# Patient Record
Sex: Female | Born: 1945
Health system: Southern US, Community
[De-identification: ages and names within clinical notes are randomized; demographics above are authoritative.]

## PROBLEM LIST (undated history)

## (undated) DIAGNOSIS — Z9221 Personal history of antineoplastic chemotherapy: Secondary | ICD-10-CM

## (undated) DIAGNOSIS — C50919 Malignant neoplasm of unspecified site of unspecified female breast: Secondary | ICD-10-CM

## (undated) DIAGNOSIS — E119 Type 2 diabetes mellitus without complications: Secondary | ICD-10-CM

## (undated) DIAGNOSIS — M1990 Unspecified osteoarthritis, unspecified site: Secondary | ICD-10-CM

## (undated) DIAGNOSIS — E785 Hyperlipidemia, unspecified: Secondary | ICD-10-CM

## (undated) DIAGNOSIS — Z923 Personal history of irradiation: Secondary | ICD-10-CM

## (undated) DIAGNOSIS — H409 Unspecified glaucoma: Secondary | ICD-10-CM

## (undated) DIAGNOSIS — R569 Unspecified convulsions: Secondary | ICD-10-CM

## (undated) DIAGNOSIS — I10 Essential (primary) hypertension: Secondary | ICD-10-CM

## (undated) HISTORY — DX: Unspecified osteoarthritis, unspecified site: M19.90

## (undated) HISTORY — DX: Unspecified convulsions: R56.9

## (undated) HISTORY — DX: Unspecified glaucoma: H40.9

## (undated) HISTORY — PX: ROTATOR CUFF REPAIR: SHX139

## (undated) HISTORY — DX: Essential (primary) hypertension: I10

## (undated) HISTORY — DX: Hyperlipidemia, unspecified: E78.5

---

## 1995-04-14 DIAGNOSIS — C50919 Malignant neoplasm of unspecified site of unspecified female breast: Secondary | ICD-10-CM

## 1995-04-14 DIAGNOSIS — Z923 Personal history of irradiation: Secondary | ICD-10-CM

## 1995-04-14 DIAGNOSIS — Z9221 Personal history of antineoplastic chemotherapy: Secondary | ICD-10-CM

## 1995-04-14 HISTORY — PX: BREAST LUMPECTOMY: SHX2

## 1995-04-14 HISTORY — DX: Personal history of antineoplastic chemotherapy: Z92.21

## 1995-04-14 HISTORY — DX: Personal history of irradiation: Z92.3

## 1995-04-14 HISTORY — DX: Malignant neoplasm of unspecified site of unspecified female breast: C50.919

## 1995-04-14 HISTORY — PX: BREAST EXCISIONAL BIOPSY: SUR124

## 2004-02-25 ENCOUNTER — Ambulatory Visit: Payer: Self-pay | Admitting: Oncology

## 2004-03-12 ENCOUNTER — Ambulatory Visit: Payer: Self-pay | Admitting: General Practice

## 2004-03-13 ENCOUNTER — Ambulatory Visit: Payer: Self-pay | Admitting: Oncology

## 2004-03-20 ENCOUNTER — Ambulatory Visit: Payer: Self-pay | Admitting: Gastroenterology

## 2004-04-23 ENCOUNTER — Ambulatory Visit: Payer: Self-pay | Admitting: Oncology

## 2004-08-25 ENCOUNTER — Ambulatory Visit: Payer: Self-pay | Admitting: Oncology

## 2004-09-11 ENCOUNTER — Ambulatory Visit: Payer: Self-pay | Admitting: Oncology

## 2005-01-22 ENCOUNTER — Ambulatory Visit (HOSPITAL_COMMUNITY): Admission: RE | Admit: 2005-01-22 | Discharge: 2005-01-22 | Payer: Self-pay | Admitting: Plastic Surgery

## 2005-01-22 ENCOUNTER — Ambulatory Visit (HOSPITAL_BASED_OUTPATIENT_CLINIC_OR_DEPARTMENT_OTHER): Admission: RE | Admit: 2005-01-22 | Discharge: 2005-01-23 | Payer: Self-pay | Admitting: Plastic Surgery

## 2005-02-23 ENCOUNTER — Ambulatory Visit: Payer: Self-pay | Admitting: Oncology

## 2005-03-13 ENCOUNTER — Ambulatory Visit: Payer: Self-pay | Admitting: Oncology

## 2005-08-24 ENCOUNTER — Ambulatory Visit: Payer: Self-pay | Admitting: Oncology

## 2005-09-11 ENCOUNTER — Ambulatory Visit: Payer: Self-pay | Admitting: Oncology

## 2006-02-23 ENCOUNTER — Ambulatory Visit: Payer: Self-pay | Admitting: Oncology

## 2006-03-13 ENCOUNTER — Ambulatory Visit: Payer: Self-pay | Admitting: Oncology

## 2006-08-12 ENCOUNTER — Ambulatory Visit: Payer: Self-pay | Admitting: Oncology

## 2006-09-02 ENCOUNTER — Ambulatory Visit: Payer: Self-pay | Admitting: Oncology

## 2006-09-07 ENCOUNTER — Ambulatory Visit: Payer: Self-pay | Admitting: Oncology

## 2006-09-12 ENCOUNTER — Ambulatory Visit: Payer: Self-pay | Admitting: Oncology

## 2007-02-12 ENCOUNTER — Ambulatory Visit: Payer: Self-pay | Admitting: Oncology

## 2007-03-07 ENCOUNTER — Ambulatory Visit: Payer: Self-pay | Admitting: Oncology

## 2007-03-14 ENCOUNTER — Ambulatory Visit: Payer: Self-pay | Admitting: Oncology

## 2007-09-06 ENCOUNTER — Ambulatory Visit: Payer: Self-pay | Admitting: Oncology

## 2007-09-12 ENCOUNTER — Ambulatory Visit: Payer: Self-pay | Admitting: Oncology

## 2007-09-15 ENCOUNTER — Ambulatory Visit: Payer: Self-pay | Admitting: Oncology

## 2007-10-12 ENCOUNTER — Ambulatory Visit: Payer: Self-pay | Admitting: Oncology

## 2008-03-13 ENCOUNTER — Ambulatory Visit: Payer: Self-pay | Admitting: Oncology

## 2008-03-19 ENCOUNTER — Ambulatory Visit: Payer: Self-pay | Admitting: Oncology

## 2008-04-13 ENCOUNTER — Ambulatory Visit: Payer: Self-pay | Admitting: Oncology

## 2008-10-09 ENCOUNTER — Ambulatory Visit: Payer: Self-pay | Admitting: Unknown Physician Specialty

## 2009-12-19 ENCOUNTER — Ambulatory Visit: Payer: Self-pay | Admitting: Unknown Physician Specialty

## 2011-02-09 ENCOUNTER — Ambulatory Visit: Payer: Self-pay | Admitting: Unknown Physician Specialty

## 2012-12-22 ENCOUNTER — Ambulatory Visit: Payer: Self-pay | Admitting: Internal Medicine

## 2013-10-10 DIAGNOSIS — M5416 Radiculopathy, lumbar region: Secondary | ICD-10-CM | POA: Insufficient documentation

## 2013-10-20 ENCOUNTER — Ambulatory Visit: Payer: Self-pay | Admitting: Physical Medicine and Rehabilitation

## 2013-10-23 DIAGNOSIS — M48062 Spinal stenosis, lumbar region with neurogenic claudication: Secondary | ICD-10-CM | POA: Insufficient documentation

## 2013-10-23 DIAGNOSIS — M5136 Other intervertebral disc degeneration, lumbar region: Secondary | ICD-10-CM | POA: Insufficient documentation

## 2013-11-10 ENCOUNTER — Ambulatory Visit: Payer: Self-pay | Admitting: Gastroenterology

## 2013-12-25 ENCOUNTER — Ambulatory Visit: Payer: Self-pay | Admitting: Internal Medicine

## 2014-01-01 DIAGNOSIS — E11649 Type 2 diabetes mellitus with hypoglycemia without coma: Secondary | ICD-10-CM | POA: Insufficient documentation

## 2014-01-23 DIAGNOSIS — E1022 Type 1 diabetes mellitus with diabetic chronic kidney disease: Secondary | ICD-10-CM | POA: Insufficient documentation

## 2014-08-23 DIAGNOSIS — E1069 Type 1 diabetes mellitus with other specified complication: Secondary | ICD-10-CM | POA: Insufficient documentation

## 2014-08-23 DIAGNOSIS — E7800 Pure hypercholesterolemia, unspecified: Secondary | ICD-10-CM | POA: Insufficient documentation

## 2014-08-23 DIAGNOSIS — E785 Hyperlipidemia, unspecified: Secondary | ICD-10-CM

## 2015-02-28 DIAGNOSIS — Z0000 Encounter for general adult medical examination without abnormal findings: Secondary | ICD-10-CM | POA: Insufficient documentation

## 2015-03-05 ENCOUNTER — Other Ambulatory Visit: Payer: Self-pay | Admitting: Internal Medicine

## 2015-03-05 DIAGNOSIS — Z1231 Encounter for screening mammogram for malignant neoplasm of breast: Secondary | ICD-10-CM

## 2015-03-14 ENCOUNTER — Ambulatory Visit
Admission: RE | Admit: 2015-03-14 | Discharge: 2015-03-14 | Disposition: A | Discharge status: Home / Self Care | Payer: Commercial Managed Care - HMO | Source: Ambulatory Visit | Attending: Internal Medicine | Admitting: Internal Medicine

## 2015-03-14 ENCOUNTER — Other Ambulatory Visit: Payer: Self-pay | Admitting: Internal Medicine

## 2015-03-14 DIAGNOSIS — Z1231 Encounter for screening mammogram for malignant neoplasm of breast: Secondary | ICD-10-CM | POA: Diagnosis present

## 2015-03-14 HISTORY — DX: Malignant neoplasm of unspecified site of unspecified female breast: C50.919

## 2015-04-16 DIAGNOSIS — Z4681 Encounter for fitting and adjustment of insulin pump: Secondary | ICD-10-CM | POA: Diagnosis not present

## 2015-04-16 DIAGNOSIS — E785 Hyperlipidemia, unspecified: Secondary | ICD-10-CM | POA: Diagnosis not present

## 2015-04-16 DIAGNOSIS — E10649 Type 1 diabetes mellitus with hypoglycemia without coma: Secondary | ICD-10-CM | POA: Diagnosis not present

## 2015-05-24 DIAGNOSIS — E10649 Type 1 diabetes mellitus with hypoglycemia without coma: Secondary | ICD-10-CM | POA: Diagnosis not present

## 2015-05-24 DIAGNOSIS — Z4681 Encounter for fitting and adjustment of insulin pump: Secondary | ICD-10-CM | POA: Diagnosis not present

## 2015-06-17 DIAGNOSIS — R300 Dysuria: Secondary | ICD-10-CM | POA: Diagnosis not present

## 2015-06-17 DIAGNOSIS — N390 Urinary tract infection, site not specified: Secondary | ICD-10-CM | POA: Diagnosis not present

## 2015-08-21 DIAGNOSIS — Z4681 Encounter for fitting and adjustment of insulin pump: Secondary | ICD-10-CM | POA: Diagnosis not present

## 2015-08-21 DIAGNOSIS — E10649 Type 1 diabetes mellitus with hypoglycemia without coma: Secondary | ICD-10-CM | POA: Diagnosis not present

## 2015-09-25 DIAGNOSIS — N390 Urinary tract infection, site not specified: Secondary | ICD-10-CM | POA: Diagnosis not present

## 2015-09-25 DIAGNOSIS — R300 Dysuria: Secondary | ICD-10-CM | POA: Diagnosis not present

## 2015-10-03 DIAGNOSIS — E10649 Type 1 diabetes mellitus with hypoglycemia without coma: Secondary | ICD-10-CM | POA: Diagnosis not present

## 2015-10-18 DIAGNOSIS — E785 Hyperlipidemia, unspecified: Secondary | ICD-10-CM | POA: Diagnosis not present

## 2015-10-18 DIAGNOSIS — E10649 Type 1 diabetes mellitus with hypoglycemia without coma: Secondary | ICD-10-CM | POA: Diagnosis not present

## 2015-10-18 DIAGNOSIS — Z4681 Encounter for fitting and adjustment of insulin pump: Secondary | ICD-10-CM | POA: Diagnosis not present

## 2015-10-31 DIAGNOSIS — Z4681 Encounter for fitting and adjustment of insulin pump: Secondary | ICD-10-CM | POA: Diagnosis not present

## 2015-10-31 DIAGNOSIS — E10649 Type 1 diabetes mellitus with hypoglycemia without coma: Secondary | ICD-10-CM | POA: Diagnosis not present

## 2015-11-22 DIAGNOSIS — Z4681 Encounter for fitting and adjustment of insulin pump: Secondary | ICD-10-CM | POA: Diagnosis not present

## 2015-11-22 DIAGNOSIS — E10649 Type 1 diabetes mellitus with hypoglycemia without coma: Secondary | ICD-10-CM | POA: Diagnosis not present

## 2016-01-31 DIAGNOSIS — E10649 Type 1 diabetes mellitus with hypoglycemia without coma: Secondary | ICD-10-CM | POA: Diagnosis not present

## 2016-01-31 DIAGNOSIS — Z4681 Encounter for fitting and adjustment of insulin pump: Secondary | ICD-10-CM | POA: Diagnosis not present

## 2016-02-24 ENCOUNTER — Other Ambulatory Visit: Payer: Self-pay | Admitting: Internal Medicine

## 2016-02-24 DIAGNOSIS — Z1231 Encounter for screening mammogram for malignant neoplasm of breast: Secondary | ICD-10-CM

## 2016-03-02 DIAGNOSIS — Z23 Encounter for immunization: Secondary | ICD-10-CM | POA: Diagnosis not present

## 2016-03-02 DIAGNOSIS — N182 Chronic kidney disease, stage 2 (mild): Secondary | ICD-10-CM | POA: Diagnosis not present

## 2016-03-02 DIAGNOSIS — Z0000 Encounter for general adult medical examination without abnormal findings: Secondary | ICD-10-CM | POA: Diagnosis not present

## 2016-03-02 DIAGNOSIS — E1022 Type 1 diabetes mellitus with diabetic chronic kidney disease: Secondary | ICD-10-CM | POA: Diagnosis not present

## 2016-03-02 DIAGNOSIS — E7800 Pure hypercholesterolemia, unspecified: Secondary | ICD-10-CM | POA: Diagnosis not present

## 2016-03-12 DIAGNOSIS — Z4681 Encounter for fitting and adjustment of insulin pump: Secondary | ICD-10-CM | POA: Diagnosis not present

## 2016-03-12 DIAGNOSIS — E10649 Type 1 diabetes mellitus with hypoglycemia without coma: Secondary | ICD-10-CM | POA: Diagnosis not present

## 2016-03-31 ENCOUNTER — Ambulatory Visit
Admission: RE | Admit: 2016-03-31 | Discharge: 2016-03-31 | Disposition: A | Discharge status: Home / Self Care | Payer: PPO | Source: Ambulatory Visit | Attending: Internal Medicine | Admitting: Internal Medicine

## 2016-03-31 DIAGNOSIS — Z1231 Encounter for screening mammogram for malignant neoplasm of breast: Secondary | ICD-10-CM | POA: Insufficient documentation

## 2016-03-31 HISTORY — DX: Personal history of irradiation: Z92.3

## 2016-03-31 HISTORY — DX: Personal history of antineoplastic chemotherapy: Z92.21

## 2016-04-07 DIAGNOSIS — R300 Dysuria: Secondary | ICD-10-CM | POA: Diagnosis not present

## 2016-04-09 DIAGNOSIS — E10649 Type 1 diabetes mellitus with hypoglycemia without coma: Secondary | ICD-10-CM | POA: Diagnosis not present

## 2016-04-09 DIAGNOSIS — E785 Hyperlipidemia, unspecified: Secondary | ICD-10-CM | POA: Diagnosis not present

## 2016-04-09 DIAGNOSIS — E7800 Pure hypercholesterolemia, unspecified: Secondary | ICD-10-CM | POA: Diagnosis not present

## 2016-04-09 DIAGNOSIS — E1022 Type 1 diabetes mellitus with diabetic chronic kidney disease: Secondary | ICD-10-CM | POA: Diagnosis not present

## 2016-04-09 DIAGNOSIS — Z0000 Encounter for general adult medical examination without abnormal findings: Secondary | ICD-10-CM | POA: Diagnosis not present

## 2016-04-09 DIAGNOSIS — N182 Chronic kidney disease, stage 2 (mild): Secondary | ICD-10-CM | POA: Diagnosis not present

## 2016-04-16 DIAGNOSIS — E10649 Type 1 diabetes mellitus with hypoglycemia without coma: Secondary | ICD-10-CM | POA: Diagnosis not present

## 2016-04-16 DIAGNOSIS — E785 Hyperlipidemia, unspecified: Secondary | ICD-10-CM | POA: Diagnosis not present

## 2016-04-16 DIAGNOSIS — Z4681 Encounter for fitting and adjustment of insulin pump: Secondary | ICD-10-CM | POA: Diagnosis not present

## 2016-05-02 ENCOUNTER — Encounter: Payer: Self-pay | Admitting: Emergency Medicine

## 2016-05-02 ENCOUNTER — Emergency Department
Admission: EM | Admit: 2016-05-02 | Discharge: 2016-05-02 | Disposition: A | Discharge status: Home / Self Care | Payer: PPO | Attending: Emergency Medicine | Admitting: Emergency Medicine

## 2016-05-02 DIAGNOSIS — M5136 Other intervertebral disc degeneration, lumbar region: Secondary | ICD-10-CM | POA: Insufficient documentation

## 2016-05-02 DIAGNOSIS — M5137 Other intervertebral disc degeneration, lumbosacral region: Secondary | ICD-10-CM

## 2016-05-02 DIAGNOSIS — M545 Low back pain: Secondary | ICD-10-CM | POA: Diagnosis not present

## 2016-05-02 DIAGNOSIS — Z853 Personal history of malignant neoplasm of breast: Secondary | ICD-10-CM | POA: Diagnosis not present

## 2016-05-02 DIAGNOSIS — M5416 Radiculopathy, lumbar region: Secondary | ICD-10-CM

## 2016-05-02 DIAGNOSIS — E119 Type 2 diabetes mellitus without complications: Secondary | ICD-10-CM | POA: Diagnosis not present

## 2016-05-02 HISTORY — DX: Type 2 diabetes mellitus without complications: E11.9

## 2016-05-02 MED ORDER — CYCLOBENZAPRINE HCL 10 MG PO TABS
5.0000 mg | ORAL_TABLET | Freq: Once | ORAL | Status: AC
  Administered 2016-05-02: 5 mg via ORAL
  Filled 2016-05-02: qty 1

## 2016-05-02 MED ORDER — NAPROXEN 500 MG PO TBEC: 500.0000 mg | DELAYED_RELEASE_TABLET | Freq: Two times a day (BID) | ORAL | 0 refills | Status: DC

## 2016-05-02 MED ORDER — DIAZEPAM 2 MG PO TABS: 2.0000 mg | ORAL_TABLET | Freq: Three times a day (TID) | ORAL | 0 refills | Status: DC | PRN

## 2016-05-02 NOTE — Discharge Instructions (Signed)
Your exam is normal today following your recent flare of back pain. Review of your MRI reveals that your have bulging discs with some nerve root impingement that affects your left and right legs. You should take the prescription meds as directed. Consider following-up with Dr. Arnoldo Morale for continued or worsening symptoms.

## 2016-05-02 NOTE — ED Provider Notes (Signed)
[facility] Emergency Department Provider Note ____________________________________________  Time seen: 2207  I have reviewed the triage vital signs and the nursing notes.  HISTORY  Chief Complaint  Back Pain  HPI Alejandra Lowe is a 71 y.o. female presents to the ED for evaluation of LBP with LLE referral to the calf. She has a remote history of lumbar DDD with radicular symptoms on the right. She denies incontinence, foot drop, or leg weakness. She admits symptoms started on Wednesday, she and her husband have just returned from a trip to [facility]. She reports excessive walking, standing in line, and activity above her normal weekly activities. She denies any slip, trips, or falls. She dosed Aleve this afternoon, and now reports near complete resolution of symptoms.   Past Medical History:  Diagnosis Date  . Breast cancer ([place]) 1997   left breast, radiation, chemo  . Diabetes mellitus without complication ([place])   . Personal history of chemotherapy 1997   BREAST CA  . Personal history of radiation therapy 1997   BREAST CA    There are no active problems to display for this patient.   Past Surgical History:  Procedure Laterality Date  . BREAST EXCISIONAL BIOPSY Left 1997   positive  . ROTATOR CUFF REPAIR      Prior to Admission medications   Medication Sig Start Date End Date Taking? Authorizing Provider  diazepam (VALIUM) 2 MG tablet Take 1 tablet (2 mg total) by mouth every 8 (eight) hours as needed for muscle spasms. 05/02/16   Zaila Crew V Bacon Shaniece Bussa, PA-C  naproxen (EC NAPROSYN) 500 MG EC tablet Take 1 tablet (500 mg total) by mouth 2 (two) times daily with a meal. 05/02/16   Ceaser Ebeling V Bacon Kaylena Pacifico, PA-C    Allergies Patient has no known allergies.  Family History  Problem Relation Age of Onset  . Breast cancer Mother 56  . Breast cancer Sister 65    Social History Social History  Substance Use Topics  . Smoking status: Never Smoker  .  Smokeless tobacco: Never Used  . Alcohol use 4.2 oz/week    7 Glasses of wine per week    Review of Systems  Constitutional: Negative for fever. Cardiovascular: Negative for chest pain. Respiratory: Negative for shortness of breath. Gastrointestinal: Negative for abdominal pain, vomiting and diarrhea. Genitourinary: Negative for dysuria. Musculoskeletal: Positive for back pain with LLE referral. Skin: Negative for rash. Neurological: Negative for headaches, focal weakness or numbness. ____________________________________________  PHYSICAL EXAM:  VITAL SIGNS: ED Triage Vitals  Enc Vitals Group     BP 05/02/16 2109 (!) 108/48     Pulse Rate 05/02/16 2109 91     Resp 05/02/16 2109 18     Temp 05/02/16 2109 98 F (36.7 C)     Temp Source 05/02/16 2109 Oral     SpO2 05/02/16 2109 99 %     Weight 05/02/16 2108 146 lb (66.2 kg)     Height 05/02/16 2108 5\' 7"  (1.702 m)     Head Circumference --      Peak Flow --      Pain Score 05/02/16 2108 7     Pain Loc --      Pain Edu? --      Excl. in [place]? --    Constitutional: Alert and oriented. Well appearing and in no distress. Head: Normocephalic and atraumatic. Cardiovascular: Normal rate, regular rhythm. Normal distal pulses. Respiratory: Normal respiratory effort. No wheezes/rales/rhonchi. Gastrointestinal: Soft and nontender.  No distention. Musculoskeletal: atient with normal spinal alignment without midline tenderness, spasm, deformity, or step-off. She does his shins from sit to stand without assistance. She had a temperature normal toe and heel raise on exam. Her lumbar flexion range is full to the floor. Negative seated straight leg raise bilaterally.Nontender with normal range of motion in all extremities.  Neurologic: nerves II through XII grossly intact. Normal LE ED DTRs bilaterally. Normal toe dorsiflexion and foot eversion on exam. Normal gait without ataxia. Normal speech and language. No gross focal neurologic deficits  are appreciated. Skin:  Skin is warm, dry and intact. No rash noted. Psychiatric: Mood and affect are normal. Patient exhibits appropriate insight and judgment. ____________________________________________   RADIOLOGY  MRI Lumbar Spine (10/2013)  IMPRESSION:  1. Central soft disc protrusion slightly asymmetric to the right at  L4-5 which could affect either or both L5 nerves. Moderate  compression of the thecal sac.  2. Focal disc bulge into the left lateral recess at L5-S1 slightly  compressing the left S1 nerve root sleeve.  ____________________________________________  PROCEDURES  Flexeril 5 mg PO ____________________________________________  INITIAL IMPRESSION / ASSESSMENT AND PLAN / ED COURSE  Patient with LBP with LLE radicular symptoms that are improved at the time of evaluation. She likely experienced a flare of back pain due to her recent cross-country travel activities. She is discharged with a prescription for Valium and EC Naproxen for spasm and inflammation, respectively. She will follow-up with Dr. Newman Pies for ongoing symptom management.  ____________________________________________  FINAL CLINICAL IMPRESSION(S) / ED DIAGNOSES  Final diagnoses:  DDD (degenerative disc disease), lumbosacral  Lumbar radiculopathy      Melvenia Needles, PA-C 05/02/16 2349    Harvest Dark, MD 05/03/16 2230

## 2016-05-02 NOTE — ED Notes (Signed)
Pt is in good condition; discharge instructions reviewed; follow up care and home care reviewed; prescription medication reviewed; pt verbalized understanding; pt is ambulatory but request use of wheelchair to leave ED; pt left with husband

## 2016-05-02 NOTE — ED Triage Notes (Addendum)
Patient with pain to left lower back radiating down her left leg. Patient reports that the pain started on Thursday but has become worse.

## 2016-05-18 DIAGNOSIS — M5416 Radiculopathy, lumbar region: Secondary | ICD-10-CM | POA: Diagnosis not present

## 2016-05-18 DIAGNOSIS — M48062 Spinal stenosis, lumbar region with neurogenic claudication: Secondary | ICD-10-CM | POA: Diagnosis not present

## 2016-05-18 DIAGNOSIS — M5136 Other intervertebral disc degeneration, lumbar region: Secondary | ICD-10-CM | POA: Diagnosis not present

## 2016-06-11 DIAGNOSIS — M5136 Other intervertebral disc degeneration, lumbar region: Secondary | ICD-10-CM | POA: Diagnosis not present

## 2016-06-11 DIAGNOSIS — M48062 Spinal stenosis, lumbar region with neurogenic claudication: Secondary | ICD-10-CM | POA: Diagnosis not present

## 2016-06-11 DIAGNOSIS — M5416 Radiculopathy, lumbar region: Secondary | ICD-10-CM | POA: Diagnosis not present

## 2016-07-02 DIAGNOSIS — E10649 Type 1 diabetes mellitus with hypoglycemia without coma: Secondary | ICD-10-CM | POA: Diagnosis not present

## 2016-07-02 DIAGNOSIS — M5136 Other intervertebral disc degeneration, lumbar region: Secondary | ICD-10-CM | POA: Diagnosis not present

## 2016-07-02 DIAGNOSIS — M5416 Radiculopathy, lumbar region: Secondary | ICD-10-CM | POA: Diagnosis not present

## 2016-07-02 DIAGNOSIS — M48062 Spinal stenosis, lumbar region with neurogenic claudication: Secondary | ICD-10-CM | POA: Diagnosis not present

## 2016-07-16 DIAGNOSIS — E10649 Type 1 diabetes mellitus with hypoglycemia without coma: Secondary | ICD-10-CM | POA: Diagnosis not present

## 2016-07-23 DIAGNOSIS — E10649 Type 1 diabetes mellitus with hypoglycemia without coma: Secondary | ICD-10-CM | POA: Diagnosis not present

## 2016-07-23 DIAGNOSIS — E785 Hyperlipidemia, unspecified: Secondary | ICD-10-CM | POA: Diagnosis not present

## 2016-07-23 DIAGNOSIS — Z4681 Encounter for fitting and adjustment of insulin pump: Secondary | ICD-10-CM | POA: Diagnosis not present

## 2016-08-12 ENCOUNTER — Other Ambulatory Visit: Payer: Self-pay | Admitting: Physical Medicine and Rehabilitation

## 2016-08-12 DIAGNOSIS — M5416 Radiculopathy, lumbar region: Secondary | ICD-10-CM | POA: Diagnosis not present

## 2016-08-12 DIAGNOSIS — M48062 Spinal stenosis, lumbar region with neurogenic claudication: Secondary | ICD-10-CM | POA: Diagnosis not present

## 2016-08-12 DIAGNOSIS — M5136 Other intervertebral disc degeneration, lumbar region: Secondary | ICD-10-CM | POA: Diagnosis not present

## 2016-08-17 ENCOUNTER — Ambulatory Visit
Admission: RE | Admit: 2016-08-17 | Discharge: 2016-08-17 | Disposition: A | Discharge status: Home / Self Care | Payer: PPO | Source: Ambulatory Visit | Attending: Physical Medicine and Rehabilitation | Admitting: Physical Medicine and Rehabilitation

## 2016-08-17 DIAGNOSIS — M5116 Intervertebral disc disorders with radiculopathy, lumbar region: Secondary | ICD-10-CM | POA: Insufficient documentation

## 2016-08-17 DIAGNOSIS — M48061 Spinal stenosis, lumbar region without neurogenic claudication: Secondary | ICD-10-CM | POA: Diagnosis not present

## 2016-08-17 DIAGNOSIS — M5416 Radiculopathy, lumbar region: Secondary | ICD-10-CM | POA: Diagnosis not present

## 2016-08-20 DIAGNOSIS — H40003 Preglaucoma, unspecified, bilateral: Secondary | ICD-10-CM | POA: Diagnosis not present

## 2016-08-20 DIAGNOSIS — H04123 Dry eye syndrome of bilateral lacrimal glands: Secondary | ICD-10-CM | POA: Diagnosis not present

## 2016-08-20 DIAGNOSIS — H26493 Other secondary cataract, bilateral: Secondary | ICD-10-CM | POA: Diagnosis not present

## 2016-08-20 DIAGNOSIS — E119 Type 2 diabetes mellitus without complications: Secondary | ICD-10-CM | POA: Diagnosis not present

## 2016-09-01 DIAGNOSIS — M5416 Radiculopathy, lumbar region: Secondary | ICD-10-CM | POA: Diagnosis not present

## 2016-09-01 DIAGNOSIS — M48062 Spinal stenosis, lumbar region with neurogenic claudication: Secondary | ICD-10-CM | POA: Diagnosis not present

## 2016-09-01 DIAGNOSIS — I10 Essential (primary) hypertension: Secondary | ICD-10-CM | POA: Diagnosis not present

## 2016-09-23 DIAGNOSIS — H26491 Other secondary cataract, right eye: Secondary | ICD-10-CM | POA: Diagnosis not present

## 2016-09-23 DIAGNOSIS — Z961 Presence of intraocular lens: Secondary | ICD-10-CM | POA: Diagnosis not present

## 2016-09-23 DIAGNOSIS — H26493 Other secondary cataract, bilateral: Secondary | ICD-10-CM | POA: Diagnosis not present

## 2016-09-23 DIAGNOSIS — E113299 Type 2 diabetes mellitus with mild nonproliferative diabetic retinopathy without macular edema, unspecified eye: Secondary | ICD-10-CM | POA: Diagnosis not present

## 2016-09-25 ENCOUNTER — Other Ambulatory Visit: Payer: Self-pay | Admitting: Neurosurgery

## 2016-09-25 ENCOUNTER — Encounter: Payer: Self-pay | Admitting: Neurosurgery

## 2016-09-25 DIAGNOSIS — M48062 Spinal stenosis, lumbar region with neurogenic claudication: Secondary | ICD-10-CM

## 2016-10-01 ENCOUNTER — Ambulatory Visit
Admission: RE | Admit: 2016-10-01 | Discharge: 2016-10-01 | Disposition: A | Discharge status: Home / Self Care | Payer: PPO | Source: Ambulatory Visit | Attending: Neurosurgery | Admitting: Neurosurgery

## 2016-10-01 VITALS — BP 121/63 | HR 64

## 2016-10-01 DIAGNOSIS — M4807 Spinal stenosis, lumbosacral region: Secondary | ICD-10-CM | POA: Diagnosis not present

## 2016-10-01 DIAGNOSIS — M48062 Spinal stenosis, lumbar region with neurogenic claudication: Secondary | ICD-10-CM

## 2016-10-01 MED ORDER — IOPAMIDOL (ISOVUE-M 200) INJECTION 41%
15.0000 mL | Freq: Once | INTRAMUSCULAR | Status: AC
  Administered 2016-10-01: 15 mL via INTRATHECAL

## 2016-10-01 MED ORDER — DIAZEPAM 5 MG PO TABS
5.0000 mg | ORAL_TABLET | Freq: Once | ORAL | Status: AC
  Administered 2016-10-01: 5 mg via ORAL

## 2016-10-01 NOTE — Discharge Instructions (Signed)

## 2016-10-02 DIAGNOSIS — E10649 Type 1 diabetes mellitus with hypoglycemia without coma: Secondary | ICD-10-CM | POA: Diagnosis not present

## 2016-10-05 DIAGNOSIS — E10649 Type 1 diabetes mellitus with hypoglycemia without coma: Secondary | ICD-10-CM | POA: Diagnosis not present

## 2016-10-09 DIAGNOSIS — M48061 Spinal stenosis, lumbar region without neurogenic claudication: Secondary | ICD-10-CM | POA: Diagnosis not present

## 2016-10-09 DIAGNOSIS — I10 Essential (primary) hypertension: Secondary | ICD-10-CM | POA: Diagnosis not present

## 2016-10-21 DIAGNOSIS — H26492 Other secondary cataract, left eye: Secondary | ICD-10-CM | POA: Diagnosis not present

## 2016-10-21 DIAGNOSIS — Z961 Presence of intraocular lens: Secondary | ICD-10-CM | POA: Diagnosis not present

## 2016-10-28 DIAGNOSIS — H26493 Other secondary cataract, bilateral: Secondary | ICD-10-CM | POA: Diagnosis not present

## 2016-11-01 DIAGNOSIS — E10649 Type 1 diabetes mellitus with hypoglycemia without coma: Secondary | ICD-10-CM | POA: Diagnosis not present

## 2016-11-11 DIAGNOSIS — M48061 Spinal stenosis, lumbar region without neurogenic claudication: Secondary | ICD-10-CM | POA: Diagnosis not present

## 2016-11-11 DIAGNOSIS — M48062 Spinal stenosis, lumbar region with neurogenic claudication: Secondary | ICD-10-CM | POA: Diagnosis not present

## 2016-12-02 DIAGNOSIS — E10649 Type 1 diabetes mellitus with hypoglycemia without coma: Secondary | ICD-10-CM | POA: Diagnosis not present

## 2016-12-12 DIAGNOSIS — N390 Urinary tract infection, site not specified: Secondary | ICD-10-CM | POA: Diagnosis not present

## 2016-12-12 DIAGNOSIS — A499 Bacterial infection, unspecified: Secondary | ICD-10-CM | POA: Diagnosis not present

## 2016-12-12 DIAGNOSIS — R300 Dysuria: Secondary | ICD-10-CM | POA: Diagnosis not present

## 2017-01-02 DIAGNOSIS — E10649 Type 1 diabetes mellitus with hypoglycemia without coma: Secondary | ICD-10-CM | POA: Diagnosis not present

## 2017-01-20 DIAGNOSIS — H40003 Preglaucoma, unspecified, bilateral: Secondary | ICD-10-CM | POA: Diagnosis not present

## 2017-01-20 DIAGNOSIS — E113213 Type 2 diabetes mellitus with mild nonproliferative diabetic retinopathy with macular edema, bilateral: Secondary | ICD-10-CM | POA: Diagnosis not present

## 2017-01-28 DIAGNOSIS — E785 Hyperlipidemia, unspecified: Secondary | ICD-10-CM | POA: Diagnosis not present

## 2017-01-28 DIAGNOSIS — E10649 Type 1 diabetes mellitus with hypoglycemia without coma: Secondary | ICD-10-CM | POA: Diagnosis not present

## 2017-01-28 DIAGNOSIS — Z4681 Encounter for fitting and adjustment of insulin pump: Secondary | ICD-10-CM | POA: Diagnosis not present

## 2017-02-01 DIAGNOSIS — E10649 Type 1 diabetes mellitus with hypoglycemia without coma: Secondary | ICD-10-CM | POA: Diagnosis not present

## 2017-03-03 DIAGNOSIS — Z23 Encounter for immunization: Secondary | ICD-10-CM | POA: Diagnosis not present

## 2017-03-03 DIAGNOSIS — E1022 Type 1 diabetes mellitus with diabetic chronic kidney disease: Secondary | ICD-10-CM | POA: Diagnosis not present

## 2017-03-03 DIAGNOSIS — Z0000 Encounter for general adult medical examination without abnormal findings: Secondary | ICD-10-CM | POA: Diagnosis not present

## 2017-03-03 DIAGNOSIS — N182 Chronic kidney disease, stage 2 (mild): Secondary | ICD-10-CM | POA: Diagnosis not present

## 2017-03-03 DIAGNOSIS — E7800 Pure hypercholesterolemia, unspecified: Secondary | ICD-10-CM | POA: Diagnosis not present

## 2017-03-04 DIAGNOSIS — E10649 Type 1 diabetes mellitus with hypoglycemia without coma: Secondary | ICD-10-CM | POA: Diagnosis not present

## 2017-03-19 DIAGNOSIS — N390 Urinary tract infection, site not specified: Secondary | ICD-10-CM | POA: Diagnosis not present

## 2017-03-19 DIAGNOSIS — R300 Dysuria: Secondary | ICD-10-CM | POA: Diagnosis not present

## 2017-04-02 DIAGNOSIS — E10649 Type 1 diabetes mellitus with hypoglycemia without coma: Secondary | ICD-10-CM | POA: Diagnosis not present

## 2017-04-03 DIAGNOSIS — E10649 Type 1 diabetes mellitus with hypoglycemia without coma: Secondary | ICD-10-CM | POA: Diagnosis not present

## 2017-04-10 DIAGNOSIS — B373 Candidiasis of vulva and vagina: Secondary | ICD-10-CM | POA: Diagnosis not present

## 2017-04-10 DIAGNOSIS — N390 Urinary tract infection, site not specified: Secondary | ICD-10-CM | POA: Diagnosis not present

## 2017-04-10 DIAGNOSIS — R300 Dysuria: Secondary | ICD-10-CM | POA: Diagnosis not present

## 2017-04-10 DIAGNOSIS — A499 Bacterial infection, unspecified: Secondary | ICD-10-CM | POA: Diagnosis not present

## 2017-04-27 DIAGNOSIS — R030 Elevated blood-pressure reading, without diagnosis of hypertension: Secondary | ICD-10-CM | POA: Diagnosis not present

## 2017-04-27 DIAGNOSIS — M48062 Spinal stenosis, lumbar region with neurogenic claudication: Secondary | ICD-10-CM | POA: Diagnosis not present

## 2017-05-04 DIAGNOSIS — E10649 Type 1 diabetes mellitus with hypoglycemia without coma: Secondary | ICD-10-CM | POA: Diagnosis not present

## 2017-06-04 DIAGNOSIS — E10649 Type 1 diabetes mellitus with hypoglycemia without coma: Secondary | ICD-10-CM | POA: Diagnosis not present

## 2017-07-02 DIAGNOSIS — E10649 Type 1 diabetes mellitus with hypoglycemia without coma: Secondary | ICD-10-CM | POA: Diagnosis not present

## 2017-07-20 DIAGNOSIS — E10649 Type 1 diabetes mellitus with hypoglycemia without coma: Secondary | ICD-10-CM | POA: Diagnosis not present

## 2017-07-20 DIAGNOSIS — E785 Hyperlipidemia, unspecified: Secondary | ICD-10-CM | POA: Diagnosis not present

## 2017-07-23 DIAGNOSIS — E10649 Type 1 diabetes mellitus with hypoglycemia without coma: Secondary | ICD-10-CM | POA: Diagnosis not present

## 2017-07-27 DIAGNOSIS — Z4681 Encounter for fitting and adjustment of insulin pump: Secondary | ICD-10-CM | POA: Diagnosis not present

## 2017-07-27 DIAGNOSIS — E785 Hyperlipidemia, unspecified: Secondary | ICD-10-CM | POA: Diagnosis not present

## 2017-07-27 DIAGNOSIS — E10649 Type 1 diabetes mellitus with hypoglycemia without coma: Secondary | ICD-10-CM | POA: Diagnosis not present

## 2017-08-02 DIAGNOSIS — E10649 Type 1 diabetes mellitus with hypoglycemia without coma: Secondary | ICD-10-CM | POA: Diagnosis not present

## 2017-08-26 DIAGNOSIS — E113213 Type 2 diabetes mellitus with mild nonproliferative diabetic retinopathy with macular edema, bilateral: Secondary | ICD-10-CM | POA: Diagnosis not present

## 2017-09-01 DIAGNOSIS — E10649 Type 1 diabetes mellitus with hypoglycemia without coma: Secondary | ICD-10-CM | POA: Diagnosis not present

## 2017-09-21 DIAGNOSIS — N390 Urinary tract infection, site not specified: Secondary | ICD-10-CM | POA: Diagnosis not present

## 2017-09-21 DIAGNOSIS — R399 Unspecified symptoms and signs involving the genitourinary system: Secondary | ICD-10-CM | POA: Diagnosis not present

## 2017-10-02 DIAGNOSIS — E10649 Type 1 diabetes mellitus with hypoglycemia without coma: Secondary | ICD-10-CM | POA: Diagnosis not present

## 2017-10-27 DIAGNOSIS — E10649 Type 1 diabetes mellitus with hypoglycemia without coma: Secondary | ICD-10-CM | POA: Diagnosis not present

## 2017-11-24 DIAGNOSIS — N390 Urinary tract infection, site not specified: Secondary | ICD-10-CM | POA: Diagnosis not present

## 2017-11-24 DIAGNOSIS — R399 Unspecified symptoms and signs involving the genitourinary system: Secondary | ICD-10-CM | POA: Diagnosis not present

## 2017-12-16 DIAGNOSIS — R300 Dysuria: Secondary | ICD-10-CM | POA: Diagnosis not present

## 2017-12-16 DIAGNOSIS — N390 Urinary tract infection, site not specified: Secondary | ICD-10-CM | POA: Diagnosis not present

## 2017-12-21 ENCOUNTER — Ambulatory Visit: Payer: PPO | Admitting: Urology

## 2017-12-21 ENCOUNTER — Encounter: Payer: Self-pay | Admitting: Urology

## 2017-12-21 VITALS — BP 117/70 | HR 78 | Ht 67.0 in | Wt 140.6 lb

## 2017-12-21 DIAGNOSIS — N952 Postmenopausal atrophic vaginitis: Secondary | ICD-10-CM | POA: Diagnosis not present

## 2017-12-21 DIAGNOSIS — N390 Urinary tract infection, site not specified: Secondary | ICD-10-CM | POA: Diagnosis not present

## 2017-12-21 DIAGNOSIS — N3941 Urge incontinence: Secondary | ICD-10-CM | POA: Diagnosis not present

## 2017-12-21 MED ORDER — ESTRADIOL 0.1 MG/GM VA CREA: TOPICAL_CREAM | VAGINAL | 12 refills | Status: DC

## 2017-12-21 MED ORDER — PHENAZOPYRIDINE HCL 200 MG PO TABS: ORAL_TABLET | ORAL | 0 refills | Status: DC

## 2017-12-21 MED ORDER — ESTROGENS, CONJUGATED 0.625 MG/GM VA CREA: TOPICAL_CREAM | VAGINAL | 12 refills | Status: DC

## 2017-12-21 MED ORDER — MIRABEGRON ER 25 MG PO TB24: 25.0000 mg | ORAL_TABLET | Freq: Every day | ORAL | 0 refills | Status: DC

## 2017-12-21 NOTE — Progress Notes (Signed)
12/21/2017 3:10 PM   Alejandra Lowe [birth year] [id number]  Referring provider: Kirk Ruths, MD [place] [facility] [place], [place] [address]  Chief Complaint  Patient presents with  . Urinary Tract Infection    HPI: Patient is a 71 -year-old Caucasian female who is referred to [place] by Dr. Luna Glasgow for recurrent urinary tract infections.  She states that she has had several urinary tract infections over the last 4 years and 3 urinary tract infections recently.  Reviewing her records,  she has had five documented urine positive urine cultures this year.   + e.coli 12/18/2017 + e.coli 11/26/2017 + e.coli 09/21/2017 + e.coli 04/10/2017 + e.coli 03/19/2018     Her symptoms with a urinary tract infection consist of frequency, suprapubic pressure and the palms of her hand hurt.     She has baseline nocturia x2-3, urge and stress incontinence that have been present for the last 2 years.  She states that she wears 1 pad daily.    She denies/endorses dysuria, gross hematuria, suprapubic pain, back pain, abdominal pain or flank pain associated with UTI's.    She has not had any recent fevers, chills, nausea or vomiting associated with UTI's.   She does not have a history of nephrolithiasis, GU surgery or GU trauma.   She is not sexually active.   She is postmenopausal.   She denies constipation and/or diarrhea.   She does engage in good perineal hygiene. She does not take tub baths.  She has/does not have incontinence.  She is using incontinence pads, one day.    She is drinking 4 bottles of water daily.   She drinks two cups of coffee.  She drinks three glasses of alcohol on the weekend.    She has tried cranberry tablets, but she still continued to have UTI's.       PMH: Past Medical History:  Diagnosis Date  . Arthritis   . Breast cancer ([place]) 1997   left breast, radiation, chemo  . Diabetes mellitus without complication  ([place])   . Glaucoma   . Hyperlipidemia   . Hypertension   . Personal history of chemotherapy 1997   BREAST CA  . Personal history of radiation therapy 1997   BREAST CA  . Seizure [facility])     Surgical History: Past Surgical History:  Procedure Laterality Date  . BREAST EXCISIONAL BIOPSY Left 1997   positive  . ROTATOR CUFF REPAIR      Home Medications:  Allergies as of 12/21/2017   No Known Allergies     Medication List        Accurate as of 12/21/17  3:10 PM. Always use your most recent med list.          aspirin EC 81 MG tablet Take by mouth.   atorvastatin 10 MG tablet Commonly known as:  LIPITOR TAKE 1/2 TABLETS (5 MG TOTAL) BY MOUTH TWICE A WEEK   calcium carbonate 500 MG chewable tablet Commonly known as:  TUMS - dosed in mg elemental calcium Chew by mouth.   calcium carbonate 600 MG Tabs tablet Commonly known as:  OS-CAL Take by mouth.   ciprofloxacin 500 MG tablet Commonly known as:  CIPRO Take 500 mg by mouth 2 (two) times daily.   conjugated estrogens vaginal cream Commonly known as:  PREMARIN Apply 0.26m (pea-sized amount)  just inside the vaginal introitus with a finger-tip on  Monday, Wednesday and Friday nights.  diazepam 2 MG tablet Commonly known as:  VALIUM Take 1 tablet (2 mg total) by mouth every 8 (eight) hours as needed for muscle spasms.   estradiol 0.1 MG/GM vaginal cream Commonly known as:  ESTRACE Apply 0.60m (pea-sized amount)  just inside the vaginal introitus with a finger-tip on Monday, Wednesday and Friday nights.   FREESTYLE LIBRE 14 DAY READER Devi Use 1 kit as directed E10.649   FREESTYLE LIBRE 14 DAY SENSOR Misc USE 1 EACH EVERY 14 (FOURTEEN) DAYS E10.649   GLUCAGON EMERGENCY 1 MG injection Generic drug:  glucagon Take as directed   mirabegron ER 25 MG Tb24 tablet Commonly known as:  MYRBETRIQ Take 1 tablet (25 mg total) by mouth daily.   naproxen 500 MG EC tablet Commonly known as:  EC NAPROSYN Take 1 tablet  (500 mg total) by mouth 2 (two) times daily with a meal.   NOVOLOG 100 UNIT/ML injection Generic drug:  insulin aspart USE UP TO 40 UNITS PER DAY IN THE ACCUCHEK PUMP AS DIRECTED.   omega-3 acid ethyl esters 1 g capsule Commonly known as:  LOVAZA Take by mouth.   phenazopyridine 200 MG tablet Commonly known as:  PYRIDIUM TAKE 1 TABLET (200 MG TOTAL) BY MOUTH 3 (THREE) TIMES DAILY WITH MEALS   RELION KETONE strip Generic drug:  acetone (urine) test   simethicone 125 MG chewable tablet Commonly known as:  MYLICON   triamcinolone 0.1 % paste Commonly known as:  KENALOG Apply small amount to ulcer as needed.   vitamin B-12 1000 MCG tablet Commonly known as:  CYANOCOBALAMIN Take by mouth.   vitamin E 400 UNIT capsule Take by mouth.       Allergies: No Known Allergies  Family History: Family History  Problem Relation Age of Onset  . Breast cancer Mother 750 . Breast cancer Sister 630   Social History:  reports that she has never smoked. She has never used smokeless tobacco. She reports that she drinks about 7.0 standard drinks of alcohol per week. She reports that she does not use drugs.  ROS: UROLOGY Frequent Urination?: Yes Hard to postpone urination?: Yes Burning/pain with urination?: Yes Get up at night to urinate?: Yes Leakage of urine?: Yes Urine stream starts and stops?: No Trouble starting stream?: No Do you have to strain to urinate?: No Blood in urine?: Yes Urinary tract infection?: Yes Sexually transmitted disease?: No Injury to kidneys or bladder?: No Painful intercourse?: No Weak stream?: No Currently pregnant?: No Vaginal bleeding?: No Last menstrual period?: Postmenopausal  Gastrointestinal Nausea?: No Vomiting?: No Indigestion/heartburn?: No Diarrhea?: No Constipation?: No  Constitutional Fever: No Night sweats?: No Weight loss?: No Fatigue?: No  Skin Skin rash/lesions?: Yes Itching?: Yes  Eyes Blurred vision?: Yes Double  vision?: No  Ears/Nose/Throat Sore throat?: No Sinus problems?: No  Hematologic/Lymphatic Swollen glands?: No Easy bruising?: Yes  Cardiovascular Leg swelling?: No Chest pain?: No  Respiratory Cough?: No Shortness of breath?: No  Endocrine Excessive thirst?: No  Musculoskeletal Back pain?: Yes Joint pain?: Yes  Neurological Headaches?: No Dizziness?: No  Psychologic Depression?: No Anxiety?: No  Physical Exam: BP 117/70 (BP Location: Left Arm, Patient Position: Sitting, Cuff Size: Normal)   Pulse 78   Ht _0  (1.702 m)   Wt 140 lb 9.6 oz (63.8 kg)   BMI 22.02 kg/m   Constitutional:  Well nourished. Alert and oriented, No acute distress. HEENT: [place] AT, moist mucus membranes.  Trachea midline, no masses. Cardiovascular: No clubbing, cyanosis, or edema.  Respiratory: Normal respiratory effort, no increased work of breathing. GI: Abdomen is soft, non tender, non distended, no abdominal masses. Liver and spleen not palpable.  No hernias appreciated.  Stool sample for occult testing is not indicated.   GU: No CVA tenderness.  No bladder fullness or masses.  Atrophic external genitalia, normal pubic hair distribution, no lesions.  Normal urethral meatus, no lesions, no prolapse, no discharge.   No urethral masses, tenderness and/or tenderness. No bladder fullness, tenderness or masses. Pale vagina mucosa, poor estrogen effect, no discharge, no lesions, good pelvic support, grade I cystocele and no rectocele noted.  No cervical motion tenderness.  Uterus is freely mobile and non-fixed.  No adnexal/parametria masses or tenderness noted.  Anus and perineum are without rashes or lesions.    Skin: No rashes, bruises or suspicious lesions. Lymph: No cervical or inguinal adenopathy. Neurologic: Grossly intact, no focal deficits, moving all 4 extremities. Psychiatric: Normal mood and affect.  Laboratory Data: No results found for: WBC, HGB, HCT, MCV, PLT  No results found for:  CREATININE  No results found for: PSA  No results found for: TESTOSTERONE  No results found for: HGBA1C  No results found for: TSH  No results found for: CHOL, HDL, CHOLHDL, VLDL, LDLCALC  No results found for: AST No results found for: ALT No components found for: ALKALINEPHOPHATASE No components found for: BILIRUBINTOTAL  No results found for: ESTRADIOL  Urinalysis No results found for: COLORURINE, APPEARANCEUR, LABSPEC, PHURINE, GLUCOSEU, HGBUR, BILIRUBINUR, KETONESUR, PROTEINUR, UROBILINOGEN, NITRITE, LEUKOCYTESUR  I have reviewed the labs.  Assessment & Plan:   1. rUTI's Criteria for recurrent UTI has been met with 2 or more infections in 6 months or 3 or greater infections in one year  Patient has good fluid intake Patient did not find taking cranberry tablets effective in preventing recurrent UTIs Patient is instructed to take probiotics  Avoid soaking in tubs and wipe front to back after urinating   2. Vaginal atrophy I explained to the patient that when women go through menopause and her estrogen levels are severely diminished, the normal vaginal flora will change.  This is due to an increase of the vaginal canal's pH. Because of this, the vaginal canal may be colonized by bacteria from the rectum instead of the protective lactobacillus.  This, accompanied by the loss of the mucus barrier with vaginal atrophy, is a cause of recurrent urinary tract infections. In some studies, the use of vaginal estrogen cream has been demonstrated to reduce  recurrent urinary tract infections to one a year.  Patient was given a sample of vaginal estrogen cream (Premarin vaginal cream) and instructed to apply 0.19m (pea-sized amount)  just inside the vaginal introitus with a finger-tip on Monday, Wednesday and Friday nights.  I explained to the patient that vaginally administered estrogen, which causes only a slight increase in the blood estrogen levels, have fewer contraindications and  adverse systemic effects that oral HT. I have also given prescriptions for the Estrace cream and Premarin cream, so that the patient may carry them to the pharmacy to see which one of the branded creams would be most economical for her.  If she finds both medications cost prohibitive, she is instructed to call the office.  We can then call in a compounded vaginal estrogen cream for the patient that may be more affordable.   She will follow up in three months for an exam.    3. Urge incontinence Discussed behavioral therapies, bladder training and bladder  control strategies Offered medical therapy with beta-3 adrenergic receptor agonist - would like to try the beta-3 adrenergic receptor agonist (Myrbetriq).  Given Myrbetriq 25 mg samples, #28.  I have reviewed with the patient of the side effects of Myrbetriq, such as: elevation in BP, urinary retention and/or HA.  She will return in one month for PVR and symptom recheck.   RTC in 3 weeks for PVR and symptom recheck                                        Return in about 3 weeks (around 01/11/2018) for PVR and OAB questionnaire.  These notes generated with voice recognition software. I apologize for typographical errors.  Zara Council, PA-C  [facility] [address]  [place], [place] [phone]

## 2017-12-28 DIAGNOSIS — E10649 Type 1 diabetes mellitus with hypoglycemia without coma: Secondary | ICD-10-CM | POA: Diagnosis not present

## 2018-01-13 ENCOUNTER — Ambulatory Visit (INDEPENDENT_AMBULATORY_CARE_PROVIDER_SITE_OTHER): Payer: PPO | Admitting: Urology

## 2018-01-13 ENCOUNTER — Encounter: Payer: Self-pay | Admitting: Urology

## 2018-01-13 VITALS — BP 105/69 | HR 78 | Ht 67.0 in | Wt 140.9 lb

## 2018-01-13 DIAGNOSIS — N3941 Urge incontinence: Secondary | ICD-10-CM | POA: Diagnosis not present

## 2018-01-13 LAB — BLADDER SCAN AMB NON-IMAGING: Scan Result: 0

## 2018-01-13 MED ORDER — MIRABEGRON ER 25 MG PO TB24: 25.0000 mg | ORAL_TABLET | Freq: Every day | ORAL | 3 refills | Status: DC

## 2018-01-13 NOTE — Progress Notes (Signed)
01/13/2018 3:28 PM   Alejandra Lowe [birth year] [id number]  Referring provider: Kirk Ruths, MD [facility] [place], [place] [address]  Chief Complaint  Patient presents with  . Follow-up    HPI: Patient is a 72 -year-old Caucasian female with recurrent UTIs, vaginal atrophy and urge incontinence who presents today for a 3-week follow-up after trial of Myrbetriq 25 mg daily.    Background history  who is referred to [place] by Dr. Luna Glasgow for recurrent urinary tract infections.  She states that she has had several urinary tract infections over the last 4 years and 3 urinary tract infections recently. Reviewing her records,  she has had five documented urine positive urine cultures this year.   + e.coli 12/18/2017 + e.coli 11/26/2017 + e.coli 09/21/2017 + e.coli 04/10/2017 + e.coli 03/19/2018   Her symptoms with a urinary tract infection consist of frequency, suprapubic pressure and the palms of her hand hurt.   She has baseline nocturia x2-3, urge and stress incontinence that have been present for the last 2 years.  She states that she wears 1 pad daily.   She denies/endorses dysuria, gross hematuria, suprapubic pain, back pain, abdominal pain or flank pain associated with UTI's.   She has not had any recent fevers, chills, nausea or vomiting associated with UTI's.   She does not have a history of nephrolithiasis, GU surgery or GU trauma.   She is not sexually active.   She is postmenopausal.   She denies constipation and/or diarrhea.   She does engage in good perineal hygiene. She does not take tub baths.  She has incontinence.  She is using incontinence pads, one day.   She is drinking 4 bottles of water daily.   She drinks two cups of coffee.  She drinks three glasses of alcohol on the weekend.  She has tried cranberry tablets, but she still continued to have UTI's.    Today, The patient has been experiencing urgency x 0-3, frequency x  4-7, not restricting fluids to avoid visits to the restroom, not engaging in toilet mapping, incontinence x 0-3 and nocturia x 0-3 (improved).   Her BP is 105/69.  Her PVR is 0 mL.  She feels the Myrbetriq has given her great improvement and would like a prescription for the medication.  She was also using the vaginal estrogen cream 3 nights weekly.  She did not find the prescription cost prohibitive.     PMH: Past Medical History:  Diagnosis Date  . Arthritis   . Breast cancer ([facility] [facility]) 1997   left breast, radiation, chemo  . Diabetes mellitus without complication ([place])   . Glaucoma   . Hyperlipidemia   . Hypertension   . Personal history of chemotherapy 1997   BREAST CA  . Personal history of radiation therapy 1997   BREAST CA  . Seizure [facility])     Surgical History: Past Surgical History:  Procedure Laterality Date  . BREAST EXCISIONAL BIOPSY Left 1997   positive  . ROTATOR CUFF REPAIR      Home Medications:  Allergies as of 01/13/2018   No Known Allergies     Medication List        Accurate as of 01/13/18  3:28 PM. Always use your most recent med list.          aspirin EC 81 MG tablet Take by mouth.   atorvastatin 10 MG tablet Commonly known as:  LIPITOR TAKE  1/2 TABLETS (5 MG TOTAL) BY MOUTH TWICE A WEEK   calcium carbonate 600 MG Tabs tablet Commonly known as:  OS-CAL Take by mouth.   conjugated estrogens vaginal cream Commonly known as:  PREMARIN Apply 0.67m (pea-sized amount)  just inside the vaginal introitus with a finger-tip on  Monday, Wednesday and Friday nights.   estradiol 0.1 MG/GM vaginal cream Commonly known as:  ESTRACE Apply 0.560m(pea-sized amount)  just inside the vaginal introitus with a finger-tip on Monday, Wednesday and Friday nights.   FREESTYLE LIBRE 14 DAY READER Devi Use 1 kit as directed E10.649   FREESTYLE LIBRE 14 DAY SENSOR Misc USE 1 EACH EVERY 14 (FOURTEEN) DAYS E10.649   GLUCAGON EMERGENCY 1 MG injection Generic drug:   glucagon Take as directed   mirabegron ER 25 MG Tb24 tablet Commonly known as:  MYRBETRIQ Take 1 tablet (25 mg total) by mouth daily.   NOVOLOG 100 UNIT/ML injection Generic drug:  insulin aspart USE UP TO 40 UNITS PER DAY IN THE ACCUCHEK PUMP AS DIRECTED.   omega-3 acid ethyl esters 1 g capsule Commonly known as:  LOVAZA Take by mouth.   phenazopyridine 200 MG tablet Commonly known as:  PYRIDIUM TAKE 1 TABLET (200 MG TOTAL) BY MOUTH 3 (THREE) TIMES DAILY WITH MEALS   RELION KETONE strip Generic drug:  acetone (urine) test   vitamin B-12 1000 MCG tablet Commonly known as:  CYANOCOBALAMIN Take by mouth.   vitamin E 400 UNIT capsule Take by mouth.       Allergies: No Known Allergies  Family History: Family History  Problem Relation Age of Onset  . Breast cancer Mother 7060. Breast cancer Sister 6422  Social History:  reports that she has never smoked. She has never used smokeless tobacco. She reports that she drinks about 7.0 standard drinks of alcohol per week. She reports that she does not use drugs.  ROS: UROLOGY Frequent Urination?: Yes Hard to postpone urination?: No Burning/pain with urination?: No Get up at night to urinate?: Yes Leakage of urine?: Yes Urine stream starts and stops?: No Trouble starting stream?: No Do you have to strain to urinate?: No Blood in urine?: No Urinary tract infection?: No Sexually transmitted disease?: No Injury to kidneys or bladder?: No Painful intercourse?: No Weak stream?: No Currently pregnant?: No Vaginal bleeding?: No Last menstrual period?: n  Gastrointestinal Nausea?: No Vomiting?: No Indigestion/heartburn?: No Diarrhea?: No Constipation?: No  Constitutional Fever: No Night sweats?: Yes Weight loss?: No Fatigue?: No  Skin Skin rash/lesions?: No Itching?: No  Eyes Blurred vision?: No Double vision?: No  Ears/Nose/Throat Sinus problems?: No  Hematologic/Lymphatic Swollen glands?: No Easy  bruising?: No  Cardiovascular Leg swelling?: No Chest pain?: No  Respiratory Cough?: No Shortness of breath?: No  Endocrine Excessive thirst?: No  Musculoskeletal Back pain?: No  Neurological Headaches?: No Dizziness?: No  Psychologic Depression?: No Anxiety?: No  Physical Exam: BP 105/69 (BP Location: Left Arm, Patient Position: Sitting, Cuff Size: Normal)   Pulse 78   Ht _0  (1.702 m)   Wt 140 lb 14.4 oz (63.9 kg)   BMI 22.07 kg/m   Constitutional: Well nourished. Alert and oriented, No acute distress. HEENT: [place] AT, moist mucus membranes. Trachea midline, no masses. Cardiovascular: No clubbing, cyanosis, or edema. Respiratory: Normal respiratory effort, no increased work of breathing. Skin: No rashes, bruises or suspicious lesions. Lymph: No cervical or inguinal adenopathy. Neurologic: Grossly intact, no focal deficits, moving all 4 extremities. Psychiatric: Normal mood  and affect.  Laboratory Data: No results found for: WBC, HGB, HCT, MCV, PLT  No results found for: CREATININE  No results found for: PSA  No results found for: TESTOSTERONE  No results found for: HGBA1C  No results found for: TSH  No results found for: CHOL, HDL, CHOLHDL, VLDL, LDLCALC  No results found for: AST No results found for: ALT No components found for: ALKALINEPHOPHATASE No components found for: BILIRUBINTOTAL  No results found for: ESTRADIOL  Urinalysis No results found for: COLORURINE, APPEARANCEUR, LABSPEC, PHURINE, GLUCOSEU, HGBUR, BILIRUBINUR, KETONESUR, PROTEINUR, UROBILINOGEN, NITRITE, LEUKOCYTESUR  I have reviewed the labs.  Assessment & Plan:   1. rUTI's No infection since her last visit with [place] Patient asked to contact her office with symptoms of UTI  2. Vaginal atrophy Continue the vaginal estrogen cream 3 nights weekly She will follow up in 6 months for an exam.    3. Urge incontinence Continue Myrbetriq 25 mg daily, prescription is  given Patient to follow-up in 6 months for OAB questionnaire, PVR and exam                                      Return in about 6 months (around 07/15/2018) for OAB questionnaire, PVR and exam.  These notes generated with voice recognition software. I apologize for typographical errors.  Zara Council, PA-C  [facility] [address]  [place], [place] [address] [phone]

## 2018-01-26 DIAGNOSIS — E10649 Type 1 diabetes mellitus with hypoglycemia without coma: Secondary | ICD-10-CM | POA: Diagnosis not present

## 2018-01-26 DIAGNOSIS — N182 Chronic kidney disease, stage 2 (mild): Secondary | ICD-10-CM | POA: Diagnosis not present

## 2018-01-26 DIAGNOSIS — E785 Hyperlipidemia, unspecified: Secondary | ICD-10-CM | POA: Diagnosis not present

## 2018-01-26 DIAGNOSIS — E1069 Type 1 diabetes mellitus with other specified complication: Secondary | ICD-10-CM | POA: Diagnosis not present

## 2018-01-26 DIAGNOSIS — E1022 Type 1 diabetes mellitus with diabetic chronic kidney disease: Secondary | ICD-10-CM | POA: Diagnosis not present

## 2018-01-28 ENCOUNTER — Other Ambulatory Visit: Payer: Self-pay | Admitting: Internal Medicine

## 2018-01-28 DIAGNOSIS — Z1231 Encounter for screening mammogram for malignant neoplasm of breast: Secondary | ICD-10-CM

## 2018-01-31 DIAGNOSIS — E10649 Type 1 diabetes mellitus with hypoglycemia without coma: Secondary | ICD-10-CM | POA: Diagnosis not present

## 2018-02-23 ENCOUNTER — Ambulatory Visit
Admission: RE | Admit: 2018-02-23 | Discharge: 2018-02-23 | Disposition: A | Discharge status: Home / Self Care | Payer: PPO | Source: Ambulatory Visit | Attending: Internal Medicine | Admitting: Internal Medicine

## 2018-02-23 DIAGNOSIS — Z1231 Encounter for screening mammogram for malignant neoplasm of breast: Secondary | ICD-10-CM | POA: Diagnosis not present

## 2018-03-01 DIAGNOSIS — H40003 Preglaucoma, unspecified, bilateral: Secondary | ICD-10-CM | POA: Diagnosis not present

## 2018-03-01 DIAGNOSIS — E103211 Type 1 diabetes mellitus with mild nonproliferative diabetic retinopathy with macular edema, right eye: Secondary | ICD-10-CM | POA: Diagnosis not present

## 2018-03-01 DIAGNOSIS — E103292 Type 1 diabetes mellitus with mild nonproliferative diabetic retinopathy without macular edema, left eye: Secondary | ICD-10-CM | POA: Diagnosis not present

## 2018-03-28 DIAGNOSIS — E113293 Type 2 diabetes mellitus with mild nonproliferative diabetic retinopathy without macular edema, bilateral: Secondary | ICD-10-CM | POA: Diagnosis not present

## 2018-03-29 DIAGNOSIS — E10649 Type 1 diabetes mellitus with hypoglycemia without coma: Secondary | ICD-10-CM | POA: Diagnosis not present

## 2018-03-31 DIAGNOSIS — Z0000 Encounter for general adult medical examination without abnormal findings: Secondary | ICD-10-CM | POA: Diagnosis not present

## 2018-03-31 DIAGNOSIS — Z23 Encounter for immunization: Secondary | ICD-10-CM | POA: Diagnosis not present

## 2018-03-31 DIAGNOSIS — M5416 Radiculopathy, lumbar region: Secondary | ICD-10-CM | POA: Diagnosis not present

## 2018-03-31 DIAGNOSIS — E785 Hyperlipidemia, unspecified: Secondary | ICD-10-CM | POA: Diagnosis not present

## 2018-03-31 DIAGNOSIS — E1069 Type 1 diabetes mellitus with other specified complication: Secondary | ICD-10-CM | POA: Diagnosis not present

## 2018-04-13 DIAGNOSIS — E10649 Type 1 diabetes mellitus with hypoglycemia without coma: Secondary | ICD-10-CM | POA: Diagnosis not present

## 2018-05-03 DIAGNOSIS — E10649 Type 1 diabetes mellitus with hypoglycemia without coma: Secondary | ICD-10-CM | POA: Diagnosis not present

## 2018-07-14 ENCOUNTER — Other Ambulatory Visit: Payer: Self-pay

## 2018-07-14 ENCOUNTER — Telehealth (INDEPENDENT_AMBULATORY_CARE_PROVIDER_SITE_OTHER): Payer: PPO | Admitting: Urology

## 2018-07-14 ENCOUNTER — Telehealth: Payer: Self-pay | Admitting: Urology

## 2018-07-14 DIAGNOSIS — Z87440 Personal history of urinary (tract) infections: Secondary | ICD-10-CM

## 2018-07-14 DIAGNOSIS — N3941 Urge incontinence: Secondary | ICD-10-CM

## 2018-07-14 DIAGNOSIS — N952 Postmenopausal atrophic vaginitis: Secondary | ICD-10-CM

## 2018-07-14 NOTE — Progress Notes (Signed)
Virtual Visit via Telephone Note  I connected with Alejandra Lowe on 07/14/2018 at [phone] by telephone and verified that I am speaking with the correct person using two identifiers.  They are located at home.   I am located at my home.    This visit type was conducted due to national recommendations for restrictions regarding the COVID-19 Pandemic (e.g. social distancing).  This format is felt to be most appropriate for this patient at this time.  All issues noted in this document were discussed and addressed.  No physical exam was performed.   I discussed the limitations, risks, security and privacy concerns of performing an evaluation and management service by telephone and the availability of in person appointments. I also discussed with the patient that there may be a patient responsible charge related to this service. The patient expressed understanding and agreed to proceed.   History of Present Illness: Alejandra Lowe is a 73 year old female with a history of rUTI's, vaginal atrophy and urge incontinence who was last seen in our office on January 13, 2018.  At that time she was not experiencing symptoms of urinary tract infection, she was instructed to continue the vaginal estrogen cream 3 nights weekly and to continue the Myrbetriq 25 mg daily.  Since October, she is not had any urinary tract infections.  She states about 2 months ago she had a day of suprapubic pressure for which she took Pyridium and the symptoms abated.  She states she is no longer using the vaginal estrogen cream as it made her feel uncomfortable in her vaginal area.  She denied any vaginal itching or discharge.  She is having good success with Myrbetriq 25 mg daily for her urge incontinence and she is wondering if she can take the medication less often.   Observations/Objective:   Assessment and Plan:  1. History of rUTI's Asymptomatic at this time Patient to contact office if she should experience symptoms of a UTI  2.  Vaginal atrophy Patient could not tolerate the vaginal estrogen cream so she has since discontinued the cream  3. Urge incontinence At goal with Myrbetriq 25 mg, she will reduce the medication to taking it every other day She will follow-up in 6 months for OAB questionnaire and PVR  Follow Up Instructions:  Alejandra Lowe will follow-up in 6 months for OAB questionnaire and PVR.  She will contact us if she should experience any symptoms of a UTI prior to that appointment.  I discussed the assessment and treatment plan with the patient. The patient was provided an opportunity to ask questions and all were answered. The patient agreed with the plan and demonstrated an understanding of the instructions.   The patient was advised to call back or seek an in-person evaluation if the symptoms worsen or if the condition fails to improve as anticipated.  I provided 10 minutes of non-face-to-face time during this encounter.   Emauri Krygier, PA-C

## 2018-07-14 NOTE — Telephone Encounter (Signed)
Would you call Alejandra Lowe and have her follow up in 6 months for an OAB questionnaire and PVR?

## 2018-07-15 NOTE — Telephone Encounter (Signed)
Follow up appointment made in 6 months for an OAB questionnaire and PVR.  Letter mailed to the patient's home address.

## 2018-08-03 DIAGNOSIS — E1069 Type 1 diabetes mellitus with other specified complication: Secondary | ICD-10-CM | POA: Diagnosis not present

## 2018-08-03 DIAGNOSIS — E10649 Type 1 diabetes mellitus with hypoglycemia without coma: Secondary | ICD-10-CM | POA: Diagnosis not present

## 2018-08-03 DIAGNOSIS — E785 Hyperlipidemia, unspecified: Secondary | ICD-10-CM | POA: Diagnosis not present

## 2018-08-08 ENCOUNTER — Telehealth: Payer: Self-pay | Admitting: Urology

## 2018-08-08 NOTE — Telephone Encounter (Signed)
Pt is having painful urination. Pt thinks she is having another UTI and would like something called in.

## 2018-08-09 ENCOUNTER — Other Ambulatory Visit: Payer: Self-pay | Admitting: Urology

## 2018-08-09 MED ORDER — SULFAMETHOXAZOLE-TRIMETHOPRIM 800-160 MG PO TABS: 1.0000 | ORAL_TABLET | Freq: Two times a day (BID) | ORAL | 0 refills | Status: DC

## 2018-08-09 NOTE — Telephone Encounter (Signed)
Made patient aware of script sent to pharmacy. 

## 2018-08-09 NOTE — Telephone Encounter (Signed)
I sent a [facility] [address] for her.

## 2018-08-19 DIAGNOSIS — R509 Fever, unspecified: Secondary | ICD-10-CM | POA: Diagnosis not present

## 2018-08-29 DIAGNOSIS — H40003 Preglaucoma, unspecified, bilateral: Secondary | ICD-10-CM | POA: Diagnosis not present

## 2018-09-14 ENCOUNTER — Other Ambulatory Visit: Payer: Self-pay

## 2018-09-14 ENCOUNTER — Other Ambulatory Visit: Payer: Self-pay | Admitting: Urology

## 2018-09-14 ENCOUNTER — Ambulatory Visit: Payer: PPO

## 2018-09-14 ENCOUNTER — Telehealth: Payer: Self-pay | Admitting: Urology

## 2018-09-14 DIAGNOSIS — N390 Urinary tract infection, site not specified: Secondary | ICD-10-CM

## 2018-09-14 LAB — MICROSCOPIC EXAMINATION
Epithelial Cells (non renal): NONE SEEN /hpf (ref 0–10)
WBC, UA: >30 /hpf — AB (ref 0–5)

## 2018-09-14 LAB — URINALYSIS, COMPLETE
Bilirubin, UA: NEGATIVE
Glucose, UA: NEGATIVE
Nitrite, UA: POSITIVE — AB
Specific Gravity, UA: 1.01 (ref 1.005–1.030)
Urobilinogen, Ur: 1 mg/dL (ref 0.2–1.0)
pH, UA: 5 (ref 5.0–7.5)

## 2018-09-14 NOTE — Telephone Encounter (Signed)
Patient called the office today with a possible UTI.  She is experiencing pain & pressure.  She has chronic UTIs.  She is requesting Cipro 500mg  - she feels like that works well for her.  [facility]  I adding patient to the nurse schedule for today for UA and culture.

## 2018-09-14 NOTE — Progress Notes (Signed)
Patient presents in office for urinalysis and culture due to urinary frequency, leakage of urine, as well as finding it difficult to postpone urine. The patient has a h/o recurrent UTI. She began having symptoms yesterday. Denies flank pain or fever.   Per Alejandra Lowe, we will wait on cx to treat. Patient informed

## 2018-09-14 NOTE — Progress Notes (Signed)
I would like to wait on the culture results as she had similar symptoms in April.  We prescribed her Septra based on symptoms due to the COVID-19 pandemic, so no UA or culture was completed.  She has an active MyChart account, so I can contact her over the weekend when her culture results become available.    Patient notified and voiced understanding.

## 2018-09-15 DIAGNOSIS — E103211 Type 1 diabetes mellitus with mild nonproliferative diabetic retinopathy with macular edema, right eye: Secondary | ICD-10-CM | POA: Insufficient documentation

## 2018-09-15 DIAGNOSIS — E103292 Type 1 diabetes mellitus with mild nonproliferative diabetic retinopathy without macular edema, left eye: Secondary | ICD-10-CM | POA: Insufficient documentation

## 2018-09-19 ENCOUNTER — Other Ambulatory Visit: Payer: Self-pay

## 2018-09-19 ENCOUNTER — Telehealth: Payer: Self-pay

## 2018-09-19 ENCOUNTER — Other Ambulatory Visit: Payer: Self-pay | Admitting: Urology

## 2018-09-19 DIAGNOSIS — E10649 Type 1 diabetes mellitus with hypoglycemia without coma: Secondary | ICD-10-CM | POA: Diagnosis not present

## 2018-09-19 LAB — CULTURE, URINE COMPREHENSIVE

## 2018-09-19 MED ORDER — PHENAZOPYRIDINE HCL 200 MG PO TABS: ORAL_TABLET | ORAL | 0 refills | Status: DC

## 2018-09-19 MED ORDER — AMOXICILLIN-POT CLAVULANATE 875-125 MG PO TABS: 1.0000 | ORAL_TABLET | Freq: Two times a day (BID) | ORAL | 0 refills | Status: DC

## 2018-09-19 NOTE — Telephone Encounter (Signed)
-----   Message from Nori Riis, PA-C sent at 09/19/2018  7:35 AM EDT ----- Would you check with Mrs. Dragos and make sure she has picked up her antibiotic?

## 2018-09-19 NOTE — Telephone Encounter (Signed)
Patient notified

## 2018-09-29 DIAGNOSIS — I10 Essential (primary) hypertension: Secondary | ICD-10-CM | POA: Insufficient documentation

## 2018-09-29 DIAGNOSIS — E1069 Type 1 diabetes mellitus with other specified complication: Secondary | ICD-10-CM | POA: Diagnosis not present

## 2018-09-29 DIAGNOSIS — E785 Hyperlipidemia, unspecified: Secondary | ICD-10-CM | POA: Diagnosis not present

## 2018-09-29 DIAGNOSIS — Z0000 Encounter for general adult medical examination without abnormal findings: Secondary | ICD-10-CM | POA: Diagnosis not present

## 2018-09-29 DIAGNOSIS — R399 Unspecified symptoms and signs involving the genitourinary system: Secondary | ICD-10-CM | POA: Diagnosis not present

## 2018-11-15 DIAGNOSIS — E119 Type 2 diabetes mellitus without complications: Secondary | ICD-10-CM | POA: Diagnosis not present

## 2018-12-23 ENCOUNTER — Ambulatory Visit: Payer: PPO | Admitting: Physician Assistant

## 2018-12-23 ENCOUNTER — Other Ambulatory Visit: Payer: Self-pay

## 2018-12-23 VITALS — BP 145/68 | HR 74 | Ht 67.0 in | Wt 144.2 lb

## 2018-12-23 DIAGNOSIS — N3001 Acute cystitis with hematuria: Secondary | ICD-10-CM | POA: Diagnosis not present

## 2018-12-23 DIAGNOSIS — N390 Urinary tract infection, site not specified: Secondary | ICD-10-CM

## 2018-12-23 LAB — MICROSCOPIC EXAMINATION: WBC, UA: >30 /hpf — AB (ref 0–5)

## 2018-12-23 LAB — URINALYSIS, COMPLETE: Specific Gravity, UA: 1.03 (ref 1.005–1.030)

## 2018-12-23 MED ORDER — NITROFURANTOIN MONOHYD MACRO 100 MG PO CAPS: 100.0000 mg | ORAL_CAPSULE | Freq: Every day | ORAL | 0 refills | Status: AC

## 2018-12-23 MED ORDER — NITROFURANTOIN MONOHYD MACRO 100 MG PO CAPS: 100.0000 mg | ORAL_CAPSULE | Freq: Two times a day (BID) | ORAL | 0 refills | Status: AC

## 2018-12-23 NOTE — Progress Notes (Signed)
12/23/2018 8:46 AM   Alejandra Lowe 05/27/1945 [id number]  CC: Bladder pain  HPI: Alejandra Lowe is a 73 y.o. female who presents today for evaluation of possible UTI. She is an established [facility] patient who last saw Alejandra Lowe on 07/14/2018 for follow-up of recurrent UTI, vaginal atrophy, and urge incontinence.  She reports a 2-day history of bladder pain. She denies fevers, chills, nausea, vomiting, flank pain, and gross hematuria. She has taken Pyridium at home with adequate symptom palliation.  She does have a history of recurrent UTI, most recently in June 2020 with multi drug-resistant E coli and treated with Augmentin. Of note, this is her third reported UTI in 5 months. She is not on daily antibiotic prophylaxis. She discontinued vaginal estrogen cream, reporting it caused an unusual vaginal sensation. She also takes Myrbetriq 32m every other day for OAB.  Patient reports daily bowel movements, however she states that her stools are often hard and pellet-like.  In-office UA unable to read or assay due to color interference; urine microscopy with >30 WBCs/HPF, 3-10 RBCs/HPF, and moderate bacteria.  PMH: Past Medical History:  Diagnosis Date  . Arthritis   . [place] [place]   left breast, radiation, chemo  . [place]   . Glaucoma   . Hyperlipidemia   . Hypertension   . Personal history of chemotherapy 1997   BREAST CA  . Personal history of radiation therapy 1997   BREAST CA  . Seizure ([facility]     Surgical History: Past Surgical History:  Procedure Laterality Date  . BREAST EXCISIONAL BIOPSY Left 1997   positive  . BREAST LUMPECTOMY Left 1997   W/ radiation  . ROTATOR CUFF REPAIR      Home Medications:  Allergies as of 12/23/2018   No Known Allergies     Medication List       Accurate as of December 23, 2018 11:59 PM. If you have any questions, ask your nurse or doctor.        amoxicillin-clavulanate  875-125 MG tablet Commonly known as: AUGMENTIN Take 1 tablet by mouth every 12 (twelve) hours.   aspirin EC 81 MG tablet Take by mouth.   atorvastatin 10 MG tablet Commonly known as: LIPITOR TAKE 1/2 TABLETS (5 MG TOTAL) BY MOUTH TWICE A WEEK   calcium carbonate 600 MG Tabs tablet Commonly known as: OS-CAL Take by mouth.   FreeStyle Libre 14 Day Reader DKerrin MoUse 1 kit as directed E[employer]14 Day Sensor Misc USE 1 EACH EVERY 14 (FOURTEEN) DAYS E10.649   Glucagon Emergency 1 MG injection Generic drug: glucagon Take as directed   mirabegron ER 25 MG Tb24 tablet Commonly known as: MYRBETRIQ Take 1 tablet (25 mg total) by mouth daily.   nitrofurantoin (macrocrystal-monohydrate) 100 MG capsule Commonly known as: MACROBID Take 1 capsule (100 mg total) by mouth every 12 (twelve) hours for 5 days. Started by: SDebroah Loop PA-C   nitrofurantoin (macrocrystal-monohydrate) 100 MG capsule Commonly known as: MACROBID Take 1 capsule (100 mg total) by mouth daily. Started by: SDebroah Loop PA-C   NovoLOG 100 UNIT/ML injection Generic drug: insulin aspart USE UP TO 40 UNITS PER DAY IN THE ACCUCHEK PUMP AS DIRECTED.   omega-3 acid ethyl esters 1 g capsule Commonly known as: LOVAZA Take by mouth.   phenazopyridine 200 MG tablet Commonly known as: PYRIDIUM TAKE 1 TABLET (200 MG TOTAL) BY MOUTH 3 (THREE) TIMES DAILY WITH MEALS  ReliOn Ketone strip Generic drug: acetone (urine) test   vitamin B-12 1000 MCG tablet Commonly known as: CYANOCOBALAMIN Take by mouth.   vitamin E 400 UNIT capsule Take by mouth.       Allergies:  No Known Allergies  Family History: Family History  Problem Relation Age of Onset  . Breast cancer Mother 7  . Breast cancer Sister 10    Social History:   reports that she has never smoked. She has never used smokeless tobacco. She reports current alcohol use of about 7.0 standard drinks of alcohol per week.  She reports that she does not use drugs.  ROS: UROLOGY Frequent Urination?: Yes Hard to postpone urination?: Yes Burning/pain with urination?: Yes Get up at night to urinate?: Yes Leakage of urine?: Yes Urine stream starts and stops?: No Trouble starting stream?: No Do you have to strain to urinate?: No Blood in urine?: No Urinary tract infection?: Yes Sexually transmitted disease?: No Injury to kidneys or bladder?: No Painful intercourse?: No Weak stream?: No Currently pregnant?: No Vaginal bleeding?: No Last menstrual period?: n  Gastrointestinal Nausea?: No Vomiting?: No Indigestion/heartburn?: No Diarrhea?: No Constipation?: No  Constitutional Fever: No Night sweats?: No Weight loss?: No Fatigue?: No  Skin Skin rash/lesions?: No Itching?: No  Eyes Blurred vision?: No Double vision?: No  Ears/Nose/Throat Sore throat?: No Sinus problems?: No  Hematologic/Lymphatic Swollen glands?: No Easy bruising?: No  Cardiovascular Leg swelling?: No Chest pain?: No  Respiratory Cough?: No Shortness of breath?: No  Endocrine Excessive thirst?: No  Musculoskeletal Back pain?: No Joint pain?: No  Neurological Headaches?: Yes Dizziness?: No  Psychologic Depression?: No Anxiety?: No  Physical Exam: BP (!) 145/68 (BP Location: Left Arm, Patient Position: Sitting, Cuff Size: Normal)   Pulse 74   Ht _0  (1.702 m)   Wt 144 lb 3.2 oz (65.4 kg)   BMI 22.58 kg/m   Constitutional:  Alert and oriented, no acute distress, nontoxic appearing HEENT: , AT Cardiovascular: No clubbing, cyanosis, or edema Respiratory: Normal respiratory effort, no increased work of breathing Skin: No rashes, bruises or suspicious lesions Neurologic: Grossly intact, no focal deficits, moving all 4 extremities Psychiatric: Normal mood and affect  Laboratory Data: Results for orders placed or performed in visit on 12/23/18  CULTURE, URINE COMPREHENSIVE   Specimen: Urine    UR  Result Value Ref Range   Urine Culture, Comprehensive Final report (A)    Organism ID, Bacteria Escherichia coli (A)    ANTIMICROBIAL SUSCEPTIBILITY Comment   Microscopic Examination   URINE  Result Value Ref Range   WBC, UA >30 (A) 0 - 5 /hpf   RBC 3-10 (A) 0 - 2 /hpf   Epithelial Cells (non renal) 0-10 0 - 10 /hpf   Bacteria, UA Moderate (A) None seen/Few  Urinalysis, Complete  Result Value Ref Range   Specific Gravity, UA 1.030 1.005 - 1.030   pH, UA CANCELED    Color, UA Orange Yellow   Appearance Ur Cloudy (A) Clear   Protein,UA CANCELED    Glucose, UA CANCELED    Ketones, UA CANCELED    Microscopic Examination See below:    Assessment & Plan:   1. Recurrent UTI Patient with history of recurrent UTI.  Of note, today's infection would mark her third urinary tract infection in 5 months.  She expresses frustration with the frequency of these infections.  Additionally, recent urine cultures demonstrate increased drug resistance.  I believe it is prudent for her to start antibiotic prophylaxis  after treatment of recurrent infection.  Nitrofurantoin 100 mg daily sent to her pharmacy; I advised her to start this after completion of treatment for her current infection..    Additionally, I advised her to start a daily cranberry supplement and add a dietary fiber supplement to assist with her constipation.  She expressed an understanding of this plan. - Start daily cranberry supplement - Start bowel regimen of Benefiber to ease stools - nitrofurantoin, macrocrystal-monohydrate, (MACROBID) 100 MG capsule; Take 1 capsule (100 mg total) by mouth daily.  Dispense: 90 capsule; Refill: 0  2. Acute cystitis with hematuria Patient with symptoms and UA consistent with acute cystitis with hematuria today.  Urine culture sent.  She will need to return to the clinic in 1 week for lab visit for repeat UA to prove resolution of hematuria.  Nitrofurantoin 100 mg BID x5 days sent to her pharmacy. -  Urinalysis, Complete - Urinalysis, Complete; Future - CULTURE, URINE COMPREHENSIVE - nitrofurantoin, macrocrystal-monohydrate, (MACROBID) 100 MG capsule; Take 1 capsule (100 mg total) by mouth every 12 (twelve) hours for 5 days.  Dispense: 10 capsule; Refill: 0  Debroah Loop, PA-C  [facility] [facility] [place] [address] [place], [place] [address] [phone]

## 2018-12-23 NOTE — Patient Instructions (Addendum)
1. Stay well hydrated. 2. Your goal is to have comfortable, formed, easy-to-pass stools. You may use either of the over-the-counter supplements Benefiber or Miralax to help with this. I recommend that you try Benefiber first and move on to Miralax if this is not helping you enough. Adjust your dose of either of these to create the desired effect. 3. Start taking an over-the-counter cranberry supplement for urinary tract health. Take this once to twice daily on an empty stomach. 4. Return to clinic in one week to drop off another urine sample.

## 2018-12-25 LAB — CULTURE, URINE COMPREHENSIVE

## 2018-12-28 ENCOUNTER — Telehealth: Payer: Self-pay | Admitting: Physician Assistant

## 2018-12-28 NOTE — Telephone Encounter (Signed)
Pt is confused about Macrobid RX that she received.  There were 2 RX sent in for 100 mg.  Can you please call pt and explain to her.  I told pt you were in clinic, so not sure when it would be.

## 2018-12-28 NOTE — Telephone Encounter (Signed)
Spoke with the patient via telephone.  I clarified that the Macrobid twice daily was for treatment of her current infection, and she should transition to once daily for antibiotic suppression.  She expressed understanding.  She stated there was a mixup with the pharmacy and she did not receive her suppressive antibiotics.  She asked me to follow-up with them directly.  I called her pharmacy and clarified that I sent 2 prescriptions for 2 different purposes.  They will fill her suppressive antibiotics and call her when this is ready today.

## 2018-12-30 ENCOUNTER — Other Ambulatory Visit: Payer: Self-pay

## 2018-12-30 ENCOUNTER — Other Ambulatory Visit: Payer: PPO

## 2018-12-30 DIAGNOSIS — N3001 Acute cystitis with hematuria: Secondary | ICD-10-CM | POA: Diagnosis not present

## 2018-12-30 LAB — MICROSCOPIC EXAMINATION
Bacteria, UA: NONE SEEN
RBC: NONE SEEN /hpf (ref 0–2)

## 2018-12-30 LAB — URINALYSIS, COMPLETE
Bilirubin, UA: NEGATIVE
Glucose, UA: NEGATIVE
Nitrite, UA: NEGATIVE
RBC, UA: NEGATIVE
Specific Gravity, UA: 1.02 (ref 1.005–1.030)
Urobilinogen, Ur: 0.2 mg/dL (ref 0.2–1.0)
pH, UA: 6 (ref 5.0–7.5)

## 2019-01-02 NOTE — Progress Notes (Signed)
Note resolution of MH following UTI tx. I spoke with the patient via telephone to inform her of her results.

## 2019-01-19 ENCOUNTER — Ambulatory Visit: Payer: PPO | Admitting: Urology

## 2019-01-19 ENCOUNTER — Ambulatory Visit: Payer: PPO | Admitting: Physician Assistant

## 2019-01-19 ENCOUNTER — Other Ambulatory Visit: Payer: Self-pay

## 2019-01-19 ENCOUNTER — Encounter: Payer: Self-pay | Admitting: Physician Assistant

## 2019-01-19 VITALS — BP 134/76 | HR 81 | Ht 66.0 in | Wt 141.0 lb

## 2019-01-19 DIAGNOSIS — Z87440 Personal history of urinary (tract) infections: Secondary | ICD-10-CM

## 2019-01-19 DIAGNOSIS — N3281 Overactive bladder: Secondary | ICD-10-CM

## 2019-01-19 LAB — BLADDER SCAN AMB NON-IMAGING

## 2019-01-19 NOTE — Progress Notes (Signed)
01/19/2019 9:29 AM   Alejandra Lowe 03-19-1946 [id number]  CC: F/u OAB  HPI: Alejandra Lowe is a 73 y.o. female who presents today for 4-monthfollow-up of OAB. She is an established [facility] patient who last saw me on 12/23/2018 for possible UTI.  Urine culture with E coli; I treated her with Macrobid twice daily x7 days.  Urologic history significant for recurrent UTI, vaginal atrophy, and urge incontinence.  I started her on Macrobid daily antibiotic prophylaxis last month.  She takes Myrbetriq 25 mg every other day.  She had previously used vaginal estrogen cream; however she stopped this, reporting it caused an unusual vaginal sensation.  She does have a history of constipation.  The patient is experiencing urgency x 0-3, frequency x 4-7, not restricting fluids to avoid visits to the restroom, not engaging in toilet mapping, incontinence x 4-7 (worsened) and nocturia x 0-3.   Her BP is 134/76.   Her PVR is 135m    Today she reports feeling well and is satisfied with her symptom management. She denies irritative voiding symptoms and does not have any concerns today.  She does wear absorbent pad every day, however she states she does not wet them regularly.  She understands that she may never be fully dry.  PMH: Past Medical History:  Diagnosis Date  . Arthritis   . [place]   [place] breast, radiation, chemo  . [place]  . Glaucoma   . Hyperlipidemia   . Hypertension   . Personal history of chemotherapy 1997   BREAST CA  . Personal history of radiation therapy 1997   BREAST CA  . [facility]    Surgical History: Past Surgical History:  Procedure Laterality Date  . BREAST EXCISIONAL BIOPSY Left 1997   positive  . BREAST LUMPECTOMY Left 1997   W/ radiation  . ROTATOR CUFF REPAIR      Home Medications:  Allergies as of 01/19/2019   No Known Allergies     Medication List       Accurate as of January 19, 2019 11:59  PM. If you have any questions, ask your nurse or doctor.        amoxicillin-clavulanate 875-125 MG tablet Commonly known as: AUGMENTIN Take 1 tablet by mouth every 12 (twelve) hours.   aspirin EC 81 MG tablet Take by mouth.   atorvastatin 10 MG tablet Commonly known as: LIPITOR TAKE 1/2 TABLETS (5 MG TOTAL) BY MOUTH TWICE A WEEK   calcium carbonate 600 MG Tabs tablet Commonly known as: OS-CAL Take by mouth.   FreeStyle Libre 14 Day Reader DeKerrin Mose 1 kit as directed E1[employer]4 Day Sensor Misc USE 1 EACH EVERY 14 (FOURTEEN) DAYS E10.649   Glucagon Emergency 1 MG injection Generic drug: glucagon Take as directed   mirabegron ER 25 MG Tb24 tablet Commonly known as: MYRBETRIQ Take 1 tablet (25 mg total) by mouth daily.   nitrofurantoin (macrocrystal-monohydrate) 100 MG capsule Commonly known as: MACROBID Take 1 capsule (100 mg total) by mouth daily.   NovoLOG 100 UNIT/ML injection Generic drug: insulin aspart USE UP TO 40 UNITS PER DAY IN THE ACCUCHEK PUMP AS DIRECTED.   omega-3 acid ethyl esters 1 g capsule Commonly known as: LOVAZA Take by mouth.   phenazopyridine 200 MG tablet Commonly known as: PYRIDIUM TAKE 1 TABLET (200 MG TOTAL) BY MOUTH 3 (THREE) TIMES DAILY WITH MEALS   ReliOn Ketone strip  Generic drug: acetone (urine) test   vitamin B-12 1000 MCG tablet Commonly known as: CYANOCOBALAMIN Take by mouth.   vitamin E 400 UNIT capsule Take by mouth.       Allergies:  No Known Allergies  Family History: Family History  Problem Relation Age of Onset  . Breast cancer Mother 53  . Breast cancer Sister 14    Social History:   reports that she has never smoked. She has never used smokeless tobacco. She reports current alcohol use of about 7.0 standard drinks of alcohol per week. She reports that she does not use drugs.  ROS: UROLOGY Frequent Urination?: Yes Hard to postpone urination?: Yes Burning/pain with urination?: No Get  up at night to urinate?: Yes Leakage of urine?: Yes Urine stream starts and stops?: No Trouble starting stream?: No Do you have to strain to urinate?: No Blood in urine?: No Urinary tract infection?: No Sexually transmitted disease?: No Injury to kidneys or bladder?: No Painful intercourse?: No Weak stream?: No Currently pregnant?: No Vaginal bleeding?: No Last menstrual period?: n  Gastrointestinal Nausea?: No Vomiting?: No Indigestion/heartburn?: No Diarrhea?: No Constipation?: No  Constitutional Fever: No Night sweats?: No Weight loss?: No Fatigue?: No  Skin Skin rash/lesions?: No Itching?: No  Eyes Blurred vision?: No Double vision?: No  Ears/Nose/Throat Sore throat?: No Sinus problems?: No  Hematologic/Lymphatic Swollen glands?: No Easy bruising?: No  Cardiovascular Leg swelling?: No Chest pain?: No  Respiratory Cough?: No Shortness of breath?: No  Endocrine Excessive thirst?: No  Musculoskeletal Back pain?: No Joint pain?: No  Neurological Headaches?: Yes Dizziness?: No  Psychologic Depression?: No Anxiety?: No  Physical Exam: BP 134/76   Pulse 81   Ht _0  (1.676 m)   Wt 141 lb (64 kg)   BMI 22.76 kg/m   Constitutional:  Alert and oriented, no acute distress, nontoxic appearing HEENT: [place], AT Cardiovascular: No clubbing, cyanosis, or edema Respiratory: Normal respiratory effort, no increased work of breathing Skin: No rashes, bruises or suspicious lesions Neurologic: Grossly intact, no focal deficits, moving all 4 extremities Psychiatric: Normal mood and affect  Laboratory Data: Results for orders placed or performed in visit on 01/19/19  BLADDER SCAN AMB NON-IMAGING  Result Value Ref Range   Scan Result 50m    Assessment & Plan:   1. OAB (overactive bladder) Per patient, symptoms are stable.  PVR within normal limits.  No need for regimen change at this time.  I did discuss with her PTNS today as a possible future  therapy if she becomes dissatisfied with her OAB symptoms or feels that they worsen.  She expressed understanding. - BLADDER SCAN AMB NON-IMAGING  2. History of recurrent UTI (urinary tract infection) Patient reports doing well on new daily Macrobid prophylaxis.  We will plan to continue this.  I advised her to request a refill when her current prescription runs out.  She expressed understanding.  Return in about 6 months (around 07/20/2019) for PVR and OAB questionnaire.  SDebroah Loop PA-C  [facility] [address] [place] [phone]

## 2019-01-23 ENCOUNTER — Other Ambulatory Visit: Payer: Self-pay | Admitting: Urology

## 2019-01-26 DIAGNOSIS — E10649 Type 1 diabetes mellitus with hypoglycemia without coma: Secondary | ICD-10-CM | POA: Diagnosis not present

## 2019-01-30 DIAGNOSIS — I10 Essential (primary) hypertension: Secondary | ICD-10-CM | POA: Diagnosis not present

## 2019-01-30 DIAGNOSIS — N182 Chronic kidney disease, stage 2 (mild): Secondary | ICD-10-CM | POA: Diagnosis not present

## 2019-01-30 DIAGNOSIS — Z23 Encounter for immunization: Secondary | ICD-10-CM | POA: Diagnosis not present

## 2019-01-30 DIAGNOSIS — E785 Hyperlipidemia, unspecified: Secondary | ICD-10-CM | POA: Diagnosis not present

## 2019-01-30 DIAGNOSIS — E1022 Type 1 diabetes mellitus with diabetic chronic kidney disease: Secondary | ICD-10-CM | POA: Diagnosis not present

## 2019-01-30 DIAGNOSIS — E1069 Type 1 diabetes mellitus with other specified complication: Secondary | ICD-10-CM | POA: Diagnosis not present

## 2019-02-01 DIAGNOSIS — E785 Hyperlipidemia, unspecified: Secondary | ICD-10-CM | POA: Diagnosis not present

## 2019-02-01 DIAGNOSIS — E10649 Type 1 diabetes mellitus with hypoglycemia without coma: Secondary | ICD-10-CM | POA: Diagnosis not present

## 2019-02-01 DIAGNOSIS — E1069 Type 1 diabetes mellitus with other specified complication: Secondary | ICD-10-CM | POA: Diagnosis not present

## 2019-02-08 DIAGNOSIS — E10649 Type 1 diabetes mellitus with hypoglycemia without coma: Secondary | ICD-10-CM | POA: Diagnosis not present

## 2019-02-08 DIAGNOSIS — E1069 Type 1 diabetes mellitus with other specified complication: Secondary | ICD-10-CM | POA: Diagnosis not present

## 2019-02-08 DIAGNOSIS — E785 Hyperlipidemia, unspecified: Secondary | ICD-10-CM | POA: Diagnosis not present

## 2019-03-06 DIAGNOSIS — H40003 Preglaucoma, unspecified, bilateral: Secondary | ICD-10-CM | POA: Diagnosis not present

## 2019-03-06 DIAGNOSIS — E103211 Type 1 diabetes mellitus with mild nonproliferative diabetic retinopathy with macular edema, right eye: Secondary | ICD-10-CM | POA: Diagnosis not present

## 2019-04-03 IMAGING — MG DIGITAL SCREENING BILATERAL MAMMOGRAM WITH TOMO AND CAD
6 of 10 series · 6 of 30 positions shown · non-contrast
Comparison: Previous exam(s).

CLINICAL DATA: Screening.

EXAM:
DIGITAL SCREENING BILATERAL MAMMOGRAM WITH TOMO AND CAD

[R MLO synth-2D]
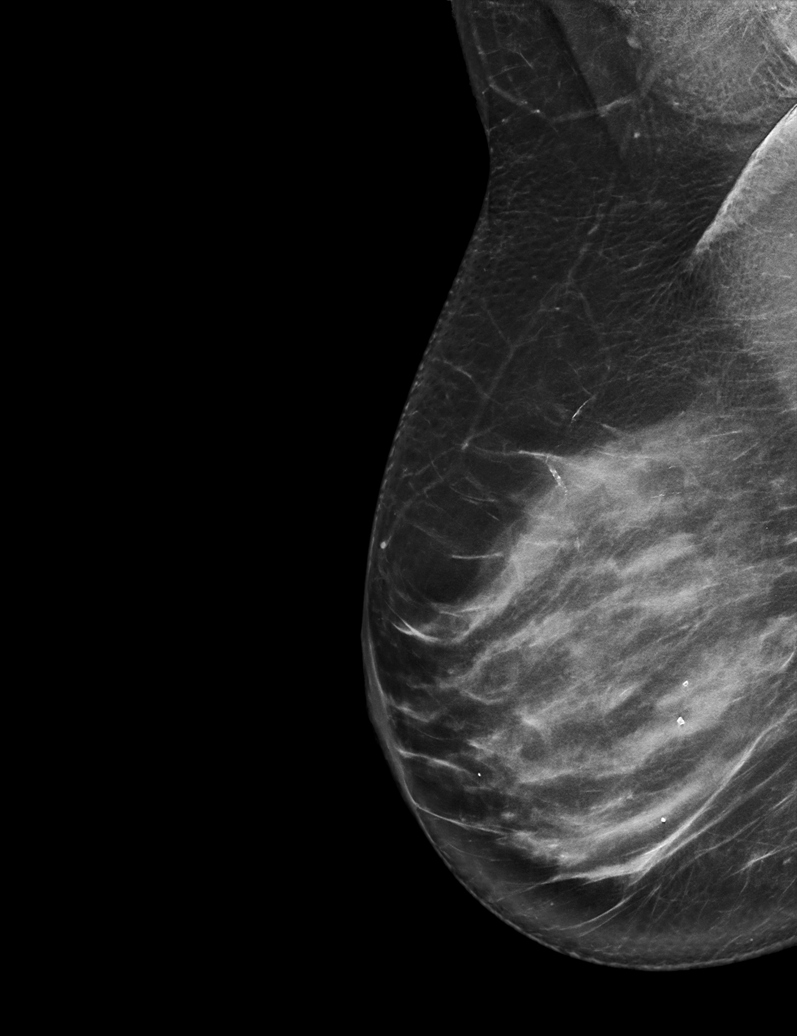

[L MLO synth-2D (1 of 2)]
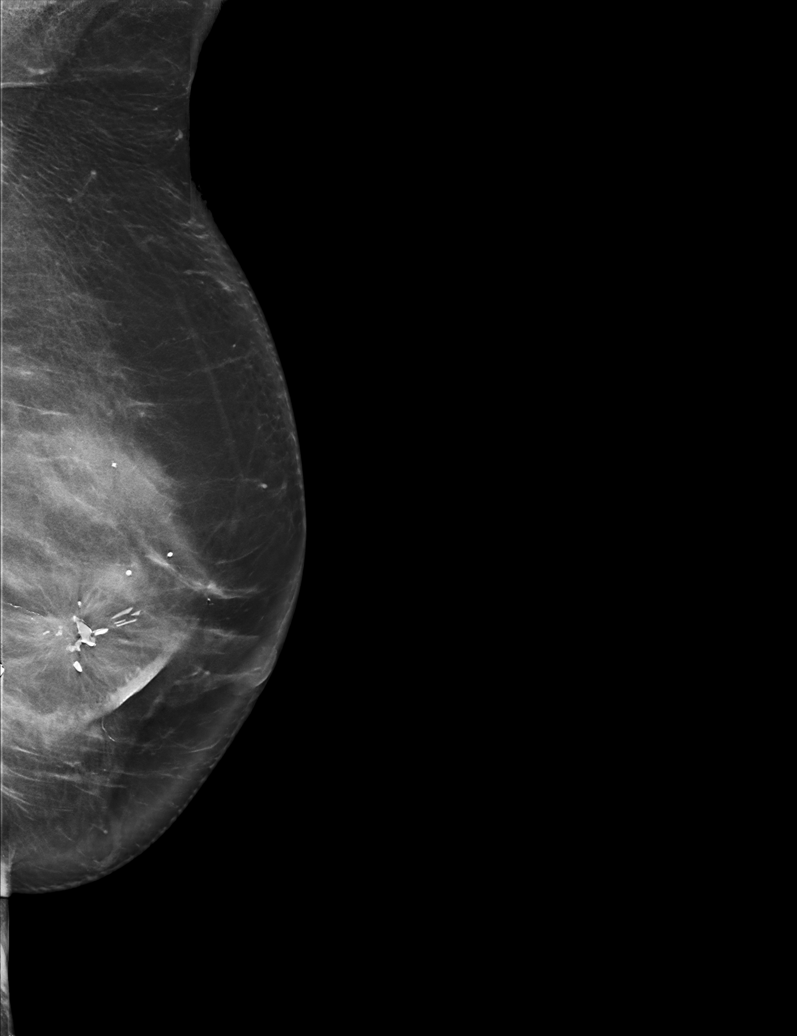

[L MLO synth-2D (2 of 2)]
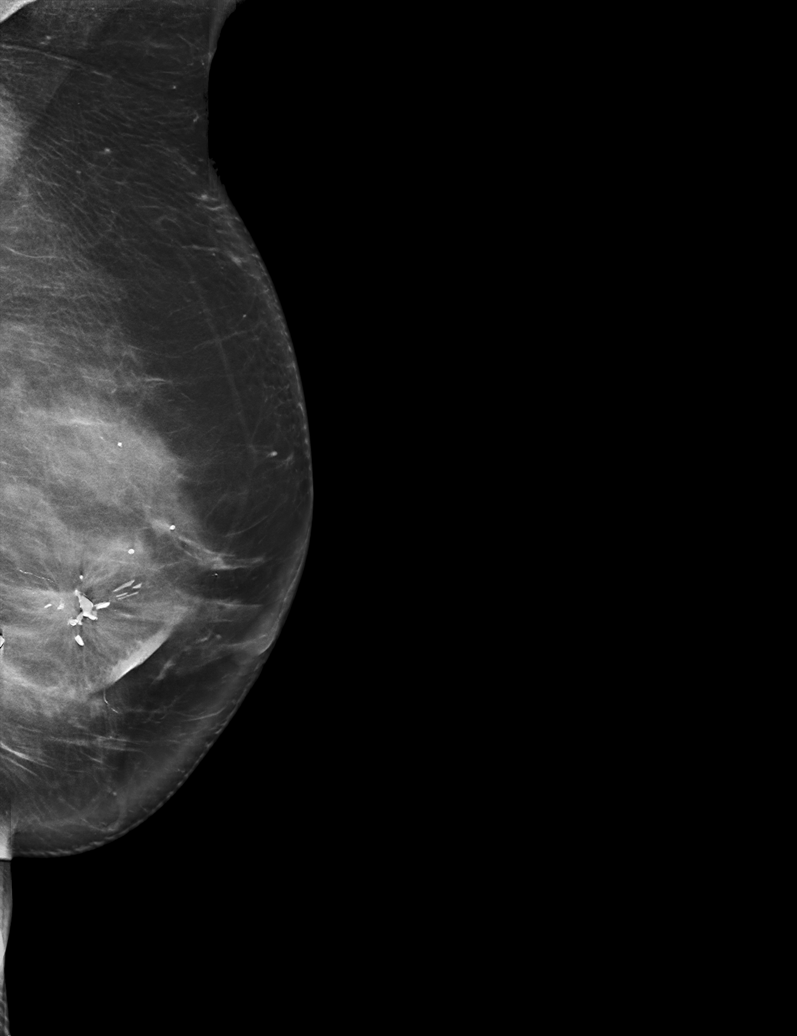

[L CC synth-2D]
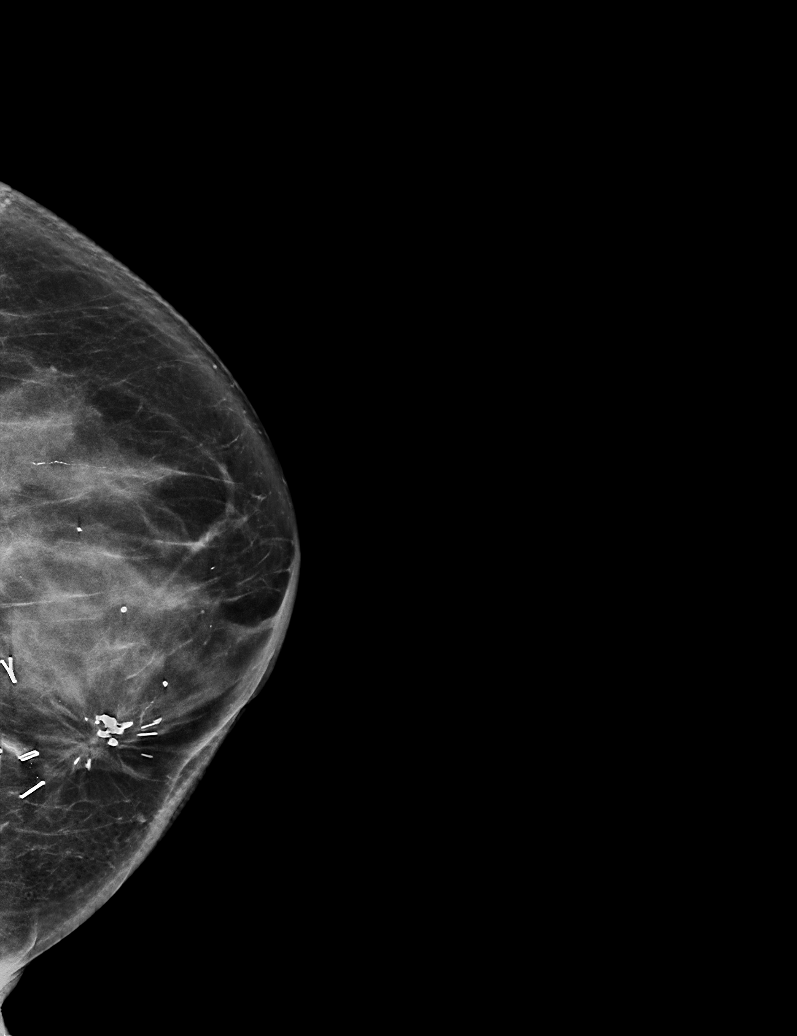

[R CC synth-2D]
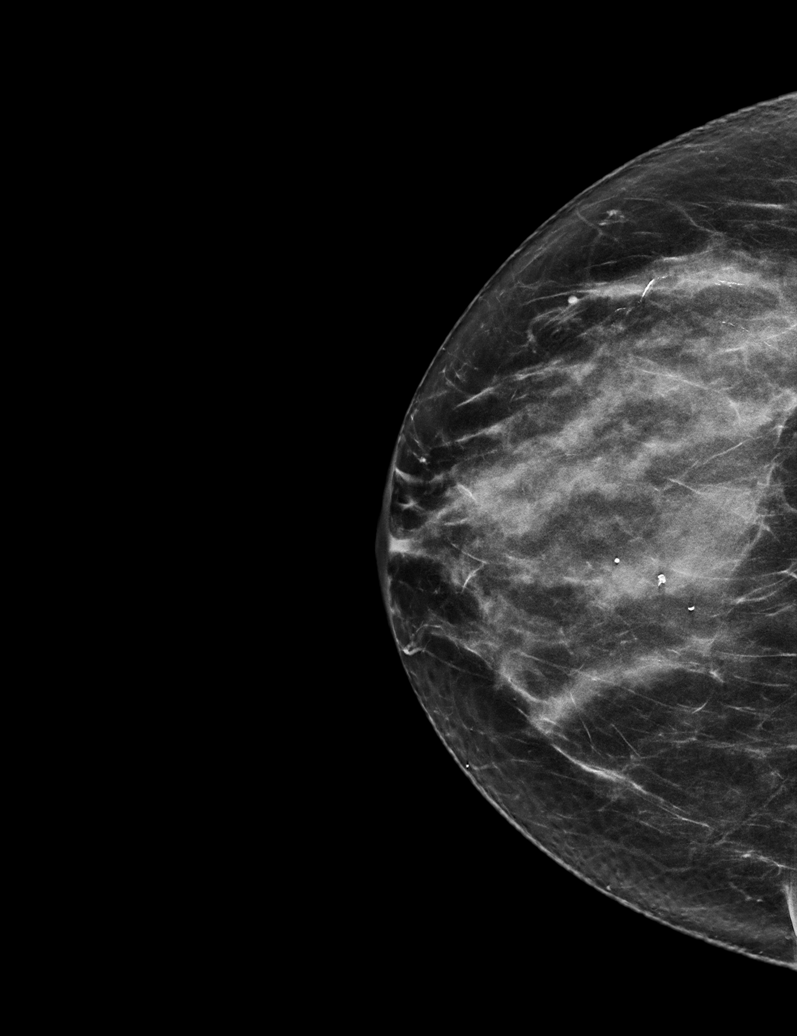

[R MLO tomo · tomo slice 41/80.0]
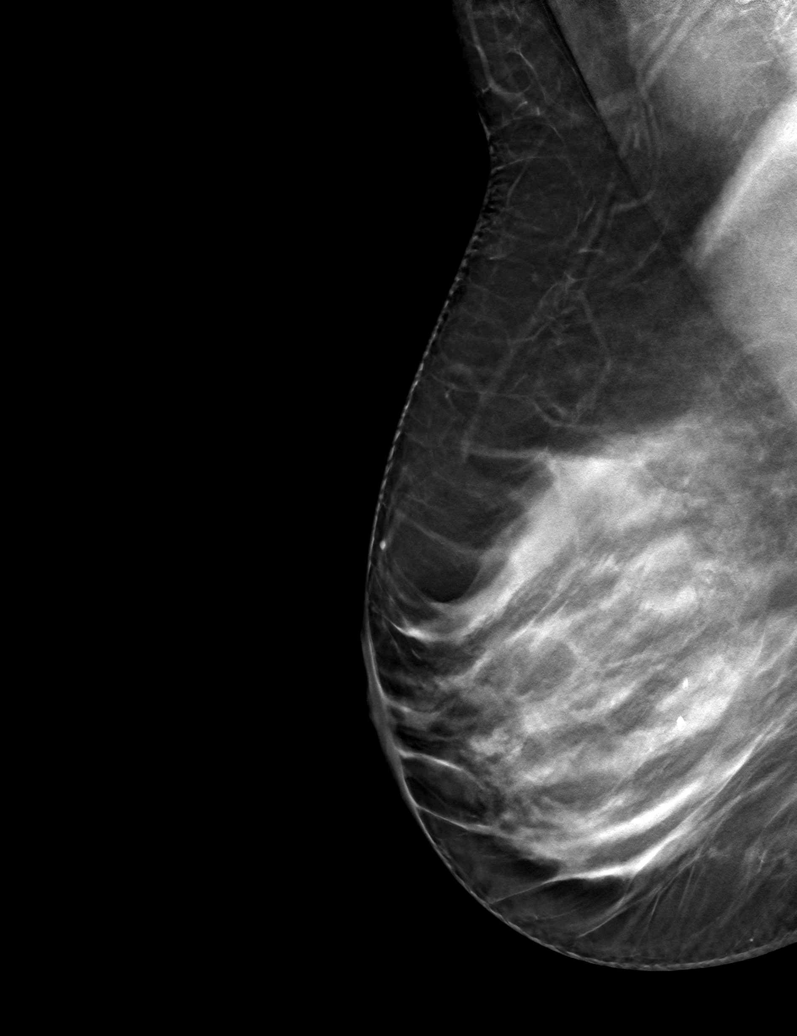

[6 of 30 positions shown; findings below may reference images not displayed]

ACR Breast Density Category c: The breast tissue is heterogeneously
dense, which may obscure small masses.
FINDINGS: There are no findings suspicious for malignancy. Images were
processed with CAD.
IMPRESSION: No mammographic evidence of malignancy. A result letter of this
screening mammogram will be mailed directly to the patient.

RECOMMENDATION:
Screening mammogram in one year. (Code:[id number])

BI-RADS CATEGORY  1: Negative.

## 2019-04-14 DIAGNOSIS — E10649 Type 1 diabetes mellitus with hypoglycemia without coma: Secondary | ICD-10-CM | POA: Diagnosis not present

## 2019-05-29 DIAGNOSIS — E10649 Type 1 diabetes mellitus with hypoglycemia without coma: Secondary | ICD-10-CM | POA: Diagnosis not present

## 2019-07-20 ENCOUNTER — Other Ambulatory Visit: Payer: Self-pay

## 2019-07-20 ENCOUNTER — Ambulatory Visit: Payer: PPO | Admitting: Urology

## 2019-07-20 VITALS — BP 123/74 | HR 54 | Ht 67.0 in | Wt 140.0 lb

## 2019-07-20 DIAGNOSIS — N3281 Overactive bladder: Secondary | ICD-10-CM | POA: Diagnosis not present

## 2019-07-20 DIAGNOSIS — N390 Urinary tract infection, site not specified: Secondary | ICD-10-CM

## 2019-07-20 LAB — BLADDER SCAN AMB NON-IMAGING: Scan Result: 0

## 2019-07-20 NOTE — Progress Notes (Signed)
07/20/2019 3:27 PM   Alejandra Lowe [birth year] [id number]  CC: F/u OAB  HPI: Alejandra Lowe is a 74 y.o. female with rUTI's, vaginal atrophy and urge incontinence who presents today for follow up.  rUTI's Risk factors: age, vaginal atrophy and incontinence + UTI for E.Coli resistant to ampicillin, ciprofloxacin, tetracycline and trimethoprim/sulfa on 12/23/2018 + UTI for E.Coli resistant to ampicillin, ciprofloxacin, tetracycline and trimethoprim/sulfa on 09/14/2018 + UTI for E.Coli resistant to ampicillin, ciprofloxacin, tetracycline and trimethoprim/sulfa on 08/19/2018 Today, she is not having any symptoms of an UTI.  Patient denies any modifying or aggravating factors.  Patient denies any gross hematuria, dysuria or suprapubic/flank pain.  Patient denies any fevers, chills, nausea or vomiting.   Vaginal atrophy Discontinued the topical estrogen cream in 07/2018.    Urge incontinence The patient is  experiencing urgency x 0-3 (stable), frequency x 8 or more (worse), not restricting fluids to avoid visits to the restroom, not engaging in toilet mapping, incontinence x 0-3 (improved) and nocturia x 2-3 (stable).   Her BP is 123/74.   Her PVR is 0 mL.  Patient denies any modifying or aggravating factors.  Patient denies any gross hematuria, dysuria or suprapubic/flank pain.  Patient denies any fevers, chills, nausea or vomiting.     She discontinued the Myrbetriq approximately 2 months ago due to cost.  Her main urinary complaints are frequency, urgency and the incontinence   PMH: Past Medical History:  Diagnosis Date  . Arthritis   . Breast cancer ([place]) 1997   left breast, radiation, chemo  . Diabetes mellitus without complication ([place])   . Glaucoma   . Hyperlipidemia   . Hypertension   . Personal history of chemotherapy 1997   BREAST CA  . Personal history of radiation therapy 1997   BREAST CA  . [facility]ure [facility])     Surgical History: Past Surgical History:  Procedure  Laterality Date  . BREAST EXCISIONAL BIOPSY Left 1997   positive  . BREAST LUMPECTOMY Left 1997   W/ radiation  . ROTATOR CUFF REPAIR      Home Medications:  Allergies as of 07/20/2019   No Known Allergies     Medication List       Accurate as of July 20, 2019 11:59 PM. If you have any questions, ask your nurse or doctor.        STOP taking these medications   amoxicillin-clavulanate 875-125 MG tablet Commonly known as: AUGMENTIN Stopped by: Divon Krabill, PA-C   Myrbetriq 25 MG Tb24 tablet Generic drug: mirabegron ER Stopped by: Larene Beach Chasity Outten, PA-C   phenazopyridine 200 MG tablet Commonly known as: PYRIDIUM Stopped by: Donyae Kohn, PA-C   ReliOn Ketone strip Generic drug: acetone (urine) test Stopped by: Sabrina Arriaga, PA-C     TAKE these medications   aspirin EC 81 MG tablet Take by mouth.   atorvastatin 10 MG tablet Commonly known as: LIPITOR TAKE 1/2 TABLETS (5 MG TOTAL) BY MOUTH TWICE A WEEK   calcium carbonate 600 MG Tabs tablet Commonly known as: OS-CAL Take by mouth.   Fish Oil 1000 MG Caps Take by mouth.   FreeStyle Libre 14 Day Reader Kerrin Mo Use 1 kit as directed [employer] 14 Day Sensor Misc USE 1 EACH EVERY 14 (FOURTEEN) DAYS E10.649   glucagon 1 MG injection Take as directed   NovoLOG 100 UNIT/ML injection Generic drug: insulin aspart USE UP TO 40 UNITS PER DAY IN THE ACCUCHEK PUMP AS DIRECTED.  omega-3 acid ethyl esters 1 g capsule Commonly known as: LOVAZA Take by mouth.   propranolol 40 MG tablet Commonly known as: INDERAL Take 40 mg by mouth 2 (two) times daily.   vitamin B-12 1000 MCG tablet Commonly known as: CYANOCOBALAMIN Take by mouth.   vitamin E 180 MG (400 UNITS) capsule Take by mouth.       Allergies:  No Known Allergies  Family History: Family History  Problem Relation Age of Onset  . Breast cancer Mother 81  . Breast cancer Sister 58    Social History:   reports that she  has never smoked. She has never used smokeless tobacco. She reports current alcohol use of about 7.0 standard drinks of alcohol per week. She reports that she does not use drugs.  ROS: For pertinent review of systems please refer to history of present illness  Physical Exam: BP 123/74   Pulse (!) 54   Ht _0  (1.702 m)   Wt 140 lb (63.5 kg)   BMI 21.93 kg/m   Constitutional:  Well nourished. Alert and oriented, No acute distress. HEENT: [place] AT, mask in plave.  Trachea midline, no masses. Cardiovascular: No clubbing, cyanosis, or edema. Respiratory: Normal respiratory effort, no increased work of breathing. Neurologic: Grossly intact, no focal deficits, moving all 4 extremities. Psychiatric: Normal mood and affect.   Laboratory Data: Hemoglobin A1c 6.4 on February 01, 2019 Serum creatinine 0.8 on February 01, 2019 I have reviewed the labs.  Pertinent imaging  Results for orders placed or performed in visit on 07/20/19  BLADDER SCAN AMB NON-IMAGING  Result Value Ref Range   Scan Result 0 ML    Assessment & Plan:    1. OAB (overactive bladder) - BLADDER SCAN AMB NON-IMAGING - We discussed PTNS, but she would like to continue to manage her urinary symptoms conservatively  2. History of recurrent UTI (urinary tract infection) - she is no longer on the nitrofurantoin prophylactic  -Asymptomatic at this visit  Return if symptoms worsen or fail to improve.  Zara Council, PA-C  [facility] [address], [place], [place] [address] [phone]

## 2019-07-24 ENCOUNTER — Encounter: Payer: Self-pay | Admitting: Urology

## 2019-07-25 ENCOUNTER — Other Ambulatory Visit: Payer: Self-pay | Admitting: Internal Medicine

## 2019-07-25 DIAGNOSIS — Z0000 Encounter for general adult medical examination without abnormal findings: Secondary | ICD-10-CM

## 2019-07-25 DIAGNOSIS — Z1231 Encounter for screening mammogram for malignant neoplasm of breast: Secondary | ICD-10-CM

## 2019-07-25 DIAGNOSIS — I10 Essential (primary) hypertension: Secondary | ICD-10-CM | POA: Diagnosis not present

## 2019-07-25 DIAGNOSIS — E1069 Type 1 diabetes mellitus with other specified complication: Secondary | ICD-10-CM | POA: Diagnosis not present

## 2019-07-25 DIAGNOSIS — E785 Hyperlipidemia, unspecified: Secondary | ICD-10-CM | POA: Diagnosis not present

## 2019-07-25 DIAGNOSIS — E10649 Type 1 diabetes mellitus with hypoglycemia without coma: Secondary | ICD-10-CM | POA: Diagnosis not present

## 2019-07-25 DIAGNOSIS — N182 Chronic kidney disease, stage 2 (mild): Secondary | ICD-10-CM | POA: Diagnosis not present

## 2019-07-25 DIAGNOSIS — E1022 Type 1 diabetes mellitus with diabetic chronic kidney disease: Secondary | ICD-10-CM | POA: Diagnosis not present

## 2019-07-27 NOTE — Telephone Encounter (Signed)
Contacted Ms Nauert as advised, left a message for her to contact our office at her earliest convenience if she would like an appt to be seen in clinic tomorrow to address whatever issues she may be having.

## 2019-07-27 NOTE — Telephone Encounter (Signed)
Please contact the patient and offer her a clinic visit with me tomorrow for evaluation of her symptoms with possible UTI.

## 2019-07-28 ENCOUNTER — Encounter: Payer: Self-pay | Admitting: Physician Assistant

## 2019-07-28 ENCOUNTER — Ambulatory Visit: Payer: PPO | Admitting: Physician Assistant

## 2019-07-28 ENCOUNTER — Other Ambulatory Visit: Payer: Self-pay

## 2019-07-28 VITALS — BP 142/72 | Ht 67.0 in | Wt 140.0 lb

## 2019-07-28 DIAGNOSIS — R3989 Other symptoms and signs involving the genitourinary system: Secondary | ICD-10-CM | POA: Diagnosis not present

## 2019-07-28 DIAGNOSIS — N3281 Overactive bladder: Secondary | ICD-10-CM | POA: Diagnosis not present

## 2019-07-28 DIAGNOSIS — N390 Urinary tract infection, site not specified: Secondary | ICD-10-CM | POA: Diagnosis not present

## 2019-07-28 LAB — MICROSCOPIC EXAMINATION

## 2019-07-28 LAB — URINALYSIS, COMPLETE
Bilirubin, UA: NEGATIVE
Glucose, UA: NEGATIVE
Ketones, UA: NEGATIVE
Nitrite, UA: NEGATIVE
Protein,UA: NEGATIVE
RBC, UA: NEGATIVE
Specific Gravity, UA: 1.025 (ref 1.005–1.030)
Urobilinogen, Ur: 0.2 mg/dL (ref 0.2–1.0)
pH, UA: 7.5 (ref 5.0–7.5)

## 2019-07-28 NOTE — Progress Notes (Signed)
07/28/2019 3:07 PM   Alejandra Lowe [birth year] [id number]  CC: Bladder feels "off"  HPI: Alejandra Lowe is a 74 y.o. female with PMH recurrent UTI, vaginal atrophy, and urge incontinence who presents today for evaluation of possible UTI. She is an established [facility] patient who last saw Zara Council on 07/20/2019 for OAB follow-up.  Today, she reports a 2-day history of her bladder feeling "off".  She states that she is not having dysuria or worsened urgency or frequency, however she is more aware of her bladder than she usually is.  She states that the sensation of bladder awareness comes and goes and is associated with a sense of urinary urgency.  She is previously on Myrbetriq and found that this helped with her urinary symptoms, however she stopped it due to cost concerns.  She was also previously on suppressive Macrobid daily, but discontinued this as well.  Overall, she states that her urinary symptoms are not terribly bothersome and she is getting used to wearing absorbent pads daily.  In-office UA today positive for trace leukocyte esterase; urine microscopy with 6-10 WBCs/HPF.  PMH: Past Medical History:  Diagnosis Date  . Arthritis   . Breast cancer ([place]) 1997   left breast, radiation, chemo  . Diabetes mellitus without complication ([place])   . Glaucoma   . Hyperlipidemia   . Hypertension   . Personal history of chemotherapy 1997   BREAST CA  . Personal history of radiation therapy 1997   BREAST CA  . Seizure [facility])     Surgical History: Past Surgical History:  Procedure Laterality Date  . BREAST EXCISIONAL BIOPSY Left 1997   positive  . BREAST LUMPECTOMY Left 1997   W/ radiation  . ROTATOR CUFF REPAIR      Home Medications:  Allergies as of 07/28/2019   No Known Allergies     Medication List       Accurate as of July 28, 2019  3:07 PM. If you have any questions, ask your nurse or doctor.        aspirin EC 81 MG tablet Take by mouth.   atorvastatin  10 MG tablet Commonly known as: LIPITOR TAKE 1/2 TABLETS (5 MG TOTAL) BY MOUTH TWICE A WEEK   calcium carbonate 600 MG Tabs tablet Commonly known as: OS-CAL Take by mouth.   Fish Oil 1000 MG Caps Take by mouth.   FreeStyle Libre 14 Day Reader Kerrin Mo Use 1 kit as directed [employer] 14 Day Sensor Misc USE 1 EACH EVERY 14 (FOURTEEN) DAYS E10.649   glucagon 1 MG injection Take as directed   NovoLOG 100 UNIT/ML injection Generic drug: insulin aspart USE UP TO 40 UNITS PER DAY IN THE ACCUCHEK PUMP AS DIRECTED.   omega-3 acid ethyl esters 1 g capsule Commonly known as: LOVAZA Take by mouth.   propranolol 40 MG tablet Commonly known as: INDERAL Take 40 mg by mouth 2 (two) times daily.   vitamin B-12 1000 MCG tablet Commonly known as: CYANOCOBALAMIN Take by mouth.   vitamin E 180 MG (400 UNITS) capsule Take by mouth.       Allergies:  No Known Allergies  Family History: Family History  Problem Relation Age of Onset  . Breast cancer Mother 53  . Breast cancer Sister 92    Social History:   reports that she has never smoked. She has never used smokeless tobacco. She reports current alcohol use of about 7.0 standard drinks of alcohol per week.  She reports that she does not use drugs.  Physical Exam: BP (!) 142/72   Ht _0  (1.702 m)   Wt 140 lb (63.5 kg)   BMI 21.93 kg/m   Constitutional:  Alert and oriented, no acute distress, nontoxic appearing HEENT: [place], AT Cardiovascular: No clubbing, cyanosis, or edema Respiratory: Normal respiratory effort, no increased work of breathing Neurologic: Grossly intact, no focal deficits, moving all 4 extremities Psychiatric: Normal mood and affect  Laboratory Data: Results for orders placed or performed in visit on 07/28/19  CULTURE, URINE COMPREHENSIVE   Specimen: Urine   UR  Result Value Ref Range   Urine Culture, Comprehensive Preliminary report    Organism ID, Bacteria Comment    Organism ID,  Bacteria Comment   Microscopic Examination   URINE  Result Value Ref Range   WBC, UA 6-10 (A) 0 - 5 /hpf   RBC 0-2 0 - 2 /hpf   Epithelial Cells (non renal) 0-10 0 - 10 /hpf   Bacteria, UA Few None seen/Few  Urinalysis, Complete  Result Value Ref Range   Specific Gravity, UA 1.025 1.005 - 1.030   pH, UA 7.5 5.0 - 7.5   Color, UA Yellow Yellow   Appearance Ur Hazy (A) Clear   Leukocytes,UA Trace (A) Negative   Protein,UA Negative Negative/Trace   Glucose, UA Negative Negative   Ketones, UA Negative Negative   RBC, UA Negative Negative   Bilirubin, UA Negative Negative   Urobilinogen, Ur 0.2 0.2 - 1.0 mg/dL   Nitrite, UA Negative Negative   Microscopic Examination See below:    Assessment & Plan:   1. Sensation of pressure in bladder area 74 year old female with a history of recurrent UTI, vaginal atrophy, and urge incontinence presents with a 2-day history of being more aware of her bladder than she typically is.  UA notable solely for mild pyuria, nonspecific.  Will send for culture for further analysis.  Suspect symptom is secondary to #2 below. - Urinalysis, Complete - CULTURE, URINE COMPREHENSIVE  2. OAB (overactive bladder) Previously on Myrbetriq, however she stopped this due to cost concerns.  I explained that in the absence of a urinary tract infection, her intermittent bladder sensations are likely representative of bladder spasms in the setting of OAB.  I recommend that she consider restarting Myrbetriq at this time.  Alternatively, we discussed other treatment modalities for OAB including PTNS, intravesical Botox, and InterStim today.  I provided her with informational brochures about each of these 3 treatments.  Counseled patient that I will contact her with her urine culture results.  If negative, I recommend we proceed with treating her OAB.  She expressed understanding.  Return if symptoms worsen or fail to improve.  Debroah Loop, PA-C  [facility] [address], [place], [place] [address] [phone]

## 2019-08-02 ENCOUNTER — Telehealth: Payer: Self-pay | Admitting: Physician Assistant

## 2019-08-02 LAB — CULTURE, URINE COMPREHENSIVE

## 2019-08-02 MED ORDER — NITROFURANTOIN MONOHYD MACRO 100 MG PO CAPS: 100.0000 mg | ORAL_CAPSULE | Freq: Two times a day (BID) | ORAL | 0 refills | Status: AC

## 2019-08-02 NOTE — Telephone Encounter (Signed)
Please contact the patient and inform her that her urine culture came back positive for a mild urinary tract infection.  I have sent Macrobid 100 mg twice daily x5 days to [facility] [place].  I would like her to continue the Myrbetriq as well.  I hope she feels better soon.

## 2019-08-02 NOTE — Telephone Encounter (Signed)
Notified patient as advised. Patient verbalized understanding.  

## 2019-08-09 DIAGNOSIS — N182 Chronic kidney disease, stage 2 (mild): Secondary | ICD-10-CM | POA: Diagnosis not present

## 2019-08-09 DIAGNOSIS — E785 Hyperlipidemia, unspecified: Secondary | ICD-10-CM | POA: Diagnosis not present

## 2019-08-09 DIAGNOSIS — E1069 Type 1 diabetes mellitus with other specified complication: Secondary | ICD-10-CM | POA: Diagnosis not present

## 2019-08-09 DIAGNOSIS — E1022 Type 1 diabetes mellitus with diabetic chronic kidney disease: Secondary | ICD-10-CM | POA: Diagnosis not present

## 2019-09-12 ENCOUNTER — Ambulatory Visit: Payer: PPO | Admitting: Urology

## 2019-09-12 ENCOUNTER — Other Ambulatory Visit: Payer: Self-pay

## 2019-09-12 ENCOUNTER — Encounter: Payer: Self-pay | Admitting: Urology

## 2019-09-12 VITALS — BP 125/72 | HR 60 | Ht 67.0 in | Wt 142.0 lb

## 2019-09-12 DIAGNOSIS — N390 Urinary tract infection, site not specified: Secondary | ICD-10-CM

## 2019-09-12 MED ORDER — CIPROFLOXACIN HCL 250 MG PO TABS: 250.0000 mg | ORAL_TABLET | Freq: Two times a day (BID) | ORAL | 0 refills | Status: DC

## 2019-09-12 NOTE — Progress Notes (Signed)
09/12/19 2:49 PM   Alejandra Lowe 02-12-1946 [id number]  Referring provider: Kirk Ruths, MD [facility] [place],  [place] [address] Chief Complaint  Patient presents with  . Recurrent UTI    HPI: Alejandra Lowe is a 74 y.o.  female with rUTI's, vaginal atrophy and urge incontinence who presents today for follow up for UTI symptoms  r UTI's Risk factors: age, vaginal atrophy and incontinence + UTI for E.Coli resistant to ampicillin, ciprofloxacin, tetracycline and trimethoprim/sulfa on 12/23/2018 + UTI for E.Coli resistant to ampicillin, ciprofloxacin, tetracycline and trimethoprim/sulfa on 09/14/2018 + UTI for E.Coli resistant to ampicillin, ciprofloxacin, tetracycline and trimethoprim/sulfa on 08/19/2018  She started to experience dysuria yesterday.  Patient denies any modifying or aggravating factors.  Patient denies any gross hematuria, dysuria or suprapubic/flank pain.  Patient denies any fevers, chills, nausea or vomiting.   UA today was red cloudy with greater than 30 WBCs, 3-10 RBCs and many bacteria.  Vaginal atrophy Discontinued the topical estrogen cream in 07/2018.    Urge incontinence She discontinued the Myrbetriq approximately 2 months ago due to cost.     PMH: Past Medical History:  Diagnosis Date  . Arthritis   . Breast cancer ([place]) 1997   left breast, radiation, chemo  . Diabetes mellitus without complication ([place])   . Glaucoma   . Hyperlipidemia   . Hypertension   . Personal history of chemotherapy 1997   BREAST CA  . Personal history of radiation therapy 1997   BREAST CA  . Seizure [facility])     Surgical History: Past Surgical History:  Procedure Laterality Date  . BREAST EXCISIONAL BIOPSY Left 1997   positive  . BREAST LUMPECTOMY Left 1997   W/ radiation  . ROTATOR CUFF REPAIR      Home Medications:  Allergies as of 09/12/2019   No Known Allergies     Medication List       Accurate as of September 12, 2019  2:49 PM. If you have any questions, ask your nurse or doctor.        aspirin EC 81 MG tablet Take by mouth.   atorvastatin 10 MG tablet Commonly known as: LIPITOR TAKE 1/2 TABLETS (5 MG TOTAL) BY MOUTH TWICE A WEEK   calcium carbonate 600 MG Tabs tablet Commonly known as: OS-CAL Take by mouth.   Fish Oil 1000 MG Caps Take by mouth.   FreeStyle Libre 14 Day Reader Kerrin Mo Use 1 kit as directed [employer] 14 Day Sensor Misc USE 1 EACH EVERY 14 (FOURTEEN) DAYS E10.649   glucagon 1 MG injection Take as directed   NovoLOG 100 UNIT/ML injection Generic drug: insulin aspart USE UP TO 40 UNITS PER DAY IN THE ACCUCHEK PUMP AS DIRECTED.   omega-3 acid ethyl esters 1 g capsule Commonly known as: LOVAZA Take by mouth.   propranolol 40 MG tablet Commonly known as: INDERAL Take 40 mg by mouth 2 (two) times daily.   vitamin B-12 1000 MCG tablet Commonly known as: CYANOCOBALAMIN Take by mouth.   vitamin E 180 MG (400 UNITS) capsule Take by mouth.       Allergies: No Known Allergies  Family History: Family History  Problem Relation Age of Onset  . Breast cancer Mother 60  . Breast cancer Sister 34    Social History:  reports that she has never smoked. She has never used smokeless tobacco. She reports current alcohol use of about 7.0  standard drinks of alcohol per week. She reports that she does not use drugs.  ROS For pertinent review of systems please refer to history of present illness  Physical Exam: BP 125/72   Pulse 60   Ht '5\' 7"'  (1.702 m)   Wt 142 lb (64.4 kg)   BMI 22.24 kg/m   Constitutional:  Alert and oriented, No acute distress. HEENT: [place] AT, mask in place. Trachea midline. Cardiovascular: No clubbing, cyanosis, or edema. Respiratory: Normal respiratory effort, no increased work of breathing. Neurologic: Grossly intact, no focal deficits, moving all 4 extremities. Psychiatric: Normal mood and affect.  Laboratory  Data: Laboratory Data: Hemoglobin A1c 6.4 on February 01, 2019 Serum creatinine 0.8 on February 01, 2019 Urinalysis Component     Latest Ref Rng & Units 09/12/2019  Specific Gravity, UA     1.005 - 1.030 1.020  pH, UA     5.0 - 7.5 5.0  Color, UA     Yellow Red (A)  Appearance Ur     Clear Cloudy (A)  Protein,UA      CANCELED  Glucose, UA      CANCELED  Ketones, UA      CANCELED  Bilirubin, UA     Negative Negative  Microscopic Examination      See below:   Component     Latest Ref Rng & Units 09/12/2019  WBC, UA     0 - 5 /hpf >30 (A)  RBC     0 - 2 /hpf 3-10 (A)  Epithelial Cells (non renal)     0 - 10 /hpf 0-10  Bacteria, UA     None seen/Few Many (A)   I have reviewed the labs.  Pertinent Imaging: Results for orders placed or performed in visit on 07/20/19 BLADDER SCAN AMB NON-IMAGING Result Value Ref Range  Scan Result 0 ML    Assessment & Plan:    1. Recurrent UTIs (urinary tract infection) UA with pyuria and hematuria Urine will be sent for culture Start cipro 249m BID for 7 days and will adjust if necessary. Pending urine culture results.  She will return in 1 mo for symptom recheck and recheck of her UA for microscopic hematuria.  [place] [place] [phone], KJoneen BoersPeace, am acting as a sEducation administratorfor [employer]Constellation [employer]ental [employer]  I have reviewed the above documentation for accuracy and completeness, and I agree with the above.    SZara Council PA-C

## 2019-09-13 LAB — MICROSCOPIC EXAMINATION: WBC, UA: >30 /hpf — AB (ref 0–5)

## 2019-09-13 LAB — URINALYSIS, COMPLETE
Bilirubin, UA: NEGATIVE
Specific Gravity, UA: 1.02 (ref 1.005–1.030)
pH, UA: 5 (ref 5.0–7.5)

## 2019-09-14 LAB — CULTURE, URINE COMPREHENSIVE

## 2019-09-15 ENCOUNTER — Telehealth: Payer: Self-pay | Admitting: Family Medicine

## 2019-09-15 MED ORDER — AMOXICILLIN-POT CLAVULANATE 875-125 MG PO TABS: 1.0000 | ORAL_TABLET | Freq: Two times a day (BID) | ORAL | 0 refills | Status: DC

## 2019-09-15 NOTE — Telephone Encounter (Signed)
-----   Message from Nori Riis, PA-C sent at 09/14/2019  4:45 PM EDT ----- Please let Mrs. Mcnamara know that her urine culture was positive for infection, but we need to switch the antibiotic.  She needs to stop the ciprofloxacin and start Augmentin 875/125 twice daily x7 days.

## 2019-09-15 NOTE — Telephone Encounter (Signed)
Patient notified and voiced understanding. ABX was sent to pharmacy. °

## 2019-10-12 NOTE — Progress Notes (Signed)
09/12/19 11:50 AM   Alejandra Lowe 07/22/1945 [id number]  Referring provider: Kirk Ruths, MD [facility] [place],  [place] [address] Chief Complaint  Patient presents with  . Recurrent UTI    HPI: Alejandra Lowe is a 74 y.o.  female with rUTI's, vaginal atrophy and urge incontinence who presents today for follow up after being treated for an UTI.    r UTI's Risk factors: age, vaginal atrophy and incontinence + UTI for E.Coli resistant to ampicillin, ciprofloxacin, tetracycline and trimethoprim/sulfa on 12/23/2018 + UTI for E.Coli resistant to ampicillin, ciprofloxacin, tetracycline and trimethoprim/sulfa on 09/14/2018 + UTI for E.Coli resistant to ampicillin, ciprofloxacin, tetracycline and trimethoprim/sulfa on 08/19/2018 + UTI for E. Coli resistant to ampicillin, ciprofloxacin, tetracycline and trimethoprim/sulfa on 09/12/2019  She is asymptomatic at this visit today.  Patient denies any modifying or aggravating factors.  Patient denies any gross hematuria, dysuria or suprapubic/flank pain.  Patient denies any fevers, chills, nausea or vomiting.   Her UA today is unremarkable.  KUB with no stones.   Vaginal atrophy She has recently restarted applying her vaginal estrogen cream  Urge incontinence She discontinued the Myrbetriq approximately 2 months ago due to cost.     PMH: Past Medical History:  Diagnosis Date  . Arthritis   . Breast cancer ([place]) 1997   left breast, radiation, chemo  . Diabetes mellitus without complication ([place])   . Glaucoma   . Hyperlipidemia   . Hypertension   . Personal history of chemotherapy 1997   BREAST CA  . Personal history of radiation therapy 1997   BREAST CA  . Seizure [facility])     Surgical History: Past Surgical History:  Procedure Laterality Date  . BREAST EXCISIONAL BIOPSY Left 1997   positive  . BREAST LUMPECTOMY Left 1997   W/ radiation  . ROTATOR CUFF REPAIR      Home  Medications:  Allergies as of 10/13/2019   No Known Allergies     Medication List       Accurate as of October 13, 2019 11:50 AM. If you have any questions, ask your nurse or doctor.        STOP taking these medications   amoxicillin-clavulanate 875-125 MG tablet Commonly known as: AUGMENTIN Stopped by: Ezekial Arns, PA-C   ciprofloxacin 250 MG tablet Commonly known as: Cipro Stopped by: Jameison Haji, PA-C     TAKE these medications   aspirin EC 81 MG tablet Take by mouth.   atorvastatin 10 MG tablet Commonly known as: LIPITOR TAKE 1/2 TABLETS (5 MG TOTAL) BY MOUTH TWICE A WEEK   calcium carbonate 600 MG Tabs tablet Commonly known as: OS-CAL Take by mouth.   Fish Oil 1000 MG Caps Take by mouth.   FreeStyle Libre 14 Day Reader Kerrin Mo Use 1 kit as directed [employer] 14 Day Sensor Misc USE 1 EACH EVERY 14 (FOURTEEN) DAYS E10.649   glucagon 1 MG injection Take as directed   NovoLOG 100 UNIT/ML injection Generic drug: insulin aspart USE UP TO 40 UNITS PER DAY IN THE ACCUCHEK PUMP AS DIRECTED.   omega-3 acid ethyl esters 1 g capsule Commonly known as: LOVAZA Take by mouth.   propranolol 40 MG tablet Commonly known as: INDERAL Take 40 mg by mouth 2 (two) times daily.   vitamin B-12 1000 MCG tablet Commonly known as: CYANOCOBALAMIN Take by mouth.   vitamin E 180 MG (400 UNITS) capsule Take by mouth.  Allergies: No Known Allergies  Family History: Family History  Problem Relation Age of Onset  . Breast cancer Mother 35  . Breast cancer Sister 73    Social History:  reports that she has never smoked. She has never used smokeless tobacco. She reports current alcohol use of about 7.0 standard drinks of alcohol per week. She reports that she does not use drugs.  ROS For pertinent review of systems please refer to history of present illness  Physical Exam: BP (!) 179/84   Pulse 60   Ht '5\' 7"'  (1.702 m)   Wt 143 lb (64.9 kg)    BMI 22.40 kg/m   Constitutional:  Well nourished. Alert and oriented, No acute distress. HEENT: [place] AT, moist mucus membranes.  Trachea midline, no masses. Cardiovascular: No clubbing, cyanosis, or edema. Respiratory: Normal respiratory effort, no increased work of breathing. Neurologic: Grossly intact, no focal deficits, moving all 4 extremities. Psychiatric: Normal mood and affect.   Laboratory Data: Laboratory Data: Hemoglobin A1c 6.4 on February 01, 2019 Serum creatinine 0.8 on February 01, 2019 Urinalysis Component     Latest Ref Rng & Units 10/13/2019  Specific Gravity, UA     1.005 - 1.030 1.015  pH, UA     5.0 - 7.5 5.5  Color, UA     Yellow Yellow  Appearance Ur     Clear Hazy (A)  Leukocytes,UA     Negative 1+ (A)  Protein,UA     Negative/Trace Negative  Glucose, UA     Negative Negative  Ketones, UA     Negative Negative  RBC, UA     Negative Trace (A)  Bilirubin, UA     Negative Negative  Urobilinogen, Ur     0.2 - 1.0 mg/dL 0.2  Nitrite, UA     Negative Negative  Microscopic Examination      See below:   Component     Latest Ref Rng & Units 10/13/2019  WBC, UA     0 - 5 /hpf 6-10 (A)  RBC     0 - 2 /hpf 0-2  Epithelial Cells (non renal)     0 - 10 /hpf 0-10  Renal Epithel, UA     None seen /hpf 0-10 (A)  Bacteria, UA     None seen/Few None seen    I have reviewed the labs.  Pertinent Imaging: CLINICAL DATA:  Flank pain.  EXAM: ABDOMEN - 1 VIEW  COMPARISON:  None.  FINDINGS: The bowel gas pattern is normal. A large amount of stool is seen throughout the colon. Well-defined oval-shaped opacities are seen overlying the right upper quadrant.  IMPRESSION: 1. Large stool burden without evidence of bowel obstruction. 2. Oval-shaped opacities overlying the right upper quadrant which likely represent multiple gallstones.   Electronically Signed   By: Virgina Norfolk M.D.   On: 10/13/2019 23:49 I have independently reviewed the  films.  See HPI.    Assessment & Plan:    1. Recurrent UTIs (urinary tract infection) Most recent infection has resolved We will obtain a KUB and renal ultrasound to evaluate for possible nidus for her recurrent urinary tract infections  2. Vaginal atrophy Continue to apply the vaginal estrogen cream 3 nights weekly  [place] [address], [place], [place] [address] [phone]

## 2019-10-13 ENCOUNTER — Other Ambulatory Visit: Payer: Self-pay

## 2019-10-13 ENCOUNTER — Ambulatory Visit
Admission: RE | Admit: 2019-10-13 | Discharge: 2019-10-13 | Disposition: A | Discharge status: Home / Self Care | Payer: PPO | Source: Ambulatory Visit | Attending: Urology | Admitting: Urology

## 2019-10-13 ENCOUNTER — Ambulatory Visit
Admission: RE | Admit: 2019-10-13 | Discharge: 2019-10-13 | Disposition: A | Discharge status: Home / Self Care | Payer: PPO | Attending: Urology | Admitting: Urology

## 2019-10-13 ENCOUNTER — Ambulatory Visit: Payer: PPO | Admitting: Urology

## 2019-10-13 VITALS — BP 179/84 | HR 60 | Ht 67.0 in | Wt 143.0 lb

## 2019-10-13 DIAGNOSIS — N390 Urinary tract infection, site not specified: Secondary | ICD-10-CM

## 2019-10-13 DIAGNOSIS — N952 Postmenopausal atrophic vaginitis: Secondary | ICD-10-CM | POA: Diagnosis not present

## 2019-10-13 DIAGNOSIS — R109 Unspecified abdominal pain: Secondary | ICD-10-CM | POA: Diagnosis not present

## 2019-10-13 LAB — MICROSCOPIC EXAMINATION: Bacteria, UA: NONE SEEN

## 2019-10-13 LAB — URINALYSIS, COMPLETE
Bilirubin, UA: NEGATIVE
Glucose, UA: NEGATIVE
Ketones, UA: NEGATIVE
Nitrite, UA: NEGATIVE
Protein,UA: NEGATIVE
Specific Gravity, UA: 1.015 (ref 1.005–1.030)
Urobilinogen, Ur: 0.2 mg/dL (ref 0.2–1.0)
pH, UA: 5.5 (ref 5.0–7.5)

## 2019-10-17 ENCOUNTER — Telehealth: Payer: Self-pay | Admitting: Family Medicine

## 2019-10-17 NOTE — Telephone Encounter (Signed)
Patient notified and voiced understanding.

## 2019-10-17 NOTE — Telephone Encounter (Signed)
-----   Message from Nori Riis, PA-C sent at 10/17/2019  6:33 AM EDT ----- Please let Mrs. Deckman know that her Xray did no show any stones.

## 2019-10-22 ENCOUNTER — Encounter: Payer: Self-pay | Admitting: Urology

## 2019-10-30 ENCOUNTER — Ambulatory Visit
Admission: RE | Admit: 2019-10-30 | Discharge: 2019-10-30 | Disposition: A | Discharge status: Home / Self Care | Payer: PPO | Source: Ambulatory Visit | Attending: Urology | Admitting: Urology

## 2019-10-30 ENCOUNTER — Other Ambulatory Visit: Payer: Self-pay

## 2019-10-30 DIAGNOSIS — Q600 Renal agenesis, unilateral: Secondary | ICD-10-CM | POA: Diagnosis not present

## 2019-10-30 DIAGNOSIS — N390 Urinary tract infection, site not specified: Secondary | ICD-10-CM | POA: Insufficient documentation

## 2019-11-01 ENCOUNTER — Telehealth: Payer: Self-pay | Admitting: Family Medicine

## 2019-11-01 NOTE — Telephone Encounter (Signed)
-----   Message from Nori Riis, PA-C sent at 10/31/2019  9:52 PM EDT ----- Please let Mrs. Bornhorst know that her renal ultrasound was normal.

## 2019-11-01 NOTE — Telephone Encounter (Signed)
Patient notified and voiced understanding.

## 2019-11-06 ENCOUNTER — Ambulatory Visit (INDEPENDENT_AMBULATORY_CARE_PROVIDER_SITE_OTHER): Payer: PPO | Admitting: Physician Assistant

## 2019-11-06 ENCOUNTER — Other Ambulatory Visit: Payer: Self-pay

## 2019-11-06 ENCOUNTER — Encounter: Payer: Self-pay | Admitting: Physician Assistant

## 2019-11-06 VITALS — BP 118/71 | HR 56 | Temp 97.9°F | Ht 67.0 in | Wt 145.5 lb

## 2019-11-06 DIAGNOSIS — N390 Urinary tract infection, site not specified: Secondary | ICD-10-CM | POA: Diagnosis not present

## 2019-11-06 DIAGNOSIS — R300 Dysuria: Secondary | ICD-10-CM

## 2019-11-06 MED ORDER — PHENAZOPYRIDINE HCL 200 MG PO TABS: 200.0000 mg | ORAL_TABLET | Freq: Three times a day (TID) | ORAL | 2 refills | Status: DC | PRN

## 2019-11-06 MED ORDER — NITROFURANTOIN MONOHYD MACRO 100 MG PO CAPS: 100.0000 mg | ORAL_CAPSULE | Freq: Two times a day (BID) | ORAL | 0 refills | Status: AC

## 2019-11-06 NOTE — Progress Notes (Signed)
11/06/2019 3:21 PM   Alejandra Lowe 06-05-1945 [id number]  CC: Chief Complaint  Patient presents with  . Dysuria    HPI: Alejandra Lowe is a 74 y.o. female with PMH recurrent UTI and atrophic vaginitis on topical vaginal estrogen cream who presents today for evaluation of possible UTI. She is an established [facility] patient who last saw Zara Council on 10/13/2019 for follow-up of the above.  Today, she reports an approximate 5-day history of dysuria, urgency, and frequency.  Last UTI approximately 2 months ago.  She denies fever, chills, nausea, vomiting, and gross hematuria.  She has been taking Pyridium for management of her symptoms.  She continues to use her topical vaginal estrogen cream 3 times weekly.  She has been drinking cranberry juice daily.  She takes activity yogurt nightly.  She denies a history of constipation.  In-office UA today unreadable due to pigment interference; urine microscopy with >30 WBCs/HPF, 3-10 RBCs/HPF, and many bacteria.  PMH: Past Medical History:  Diagnosis Date  . Arthritis   . Breast cancer ([place]) 1997   left breast, radiation, chemo  . Diabetes mellitus without complication ([place])   . Glaucoma   . Hyperlipidemia   . Hypertension   . Personal history of chemotherapy 1997   BREAST CA  . Personal history of radiation therapy 1997   BREAST CA  . Seizure [facility])     Surgical History: Past Surgical History:  Procedure Laterality Date  . BREAST EXCISIONAL BIOPSY Left 1997   positive  . BREAST LUMPECTOMY Left 1997   W/ radiation  . ROTATOR CUFF REPAIR      Home Medications:  Allergies as of 11/06/2019   No Known Allergies     Medication List       Accurate as of November 06, 2019  3:21 PM. If you have any questions, ask your nurse or doctor.        aspirin EC 81 MG tablet Take by mouth.   atorvastatin 10 MG tablet Commonly known as: LIPITOR TAKE 1/2 TABLETS (5 MG TOTAL) BY MOUTH TWICE A WEEK   calcium carbonate 600 MG Tabs  tablet Commonly known as: OS-CAL Take by mouth.   Fish Oil 1000 MG Caps Take by mouth.   FreeStyle Libre 14 Day Reader Kerrin Mo Use 1 kit as directed [employer] 14 Day Sensor Misc USE 1 EACH EVERY 14 (FOURTEEN) DAYS E10.649   glucagon 1 MG injection Take as directed   NovoLOG 100 UNIT/ML injection Generic drug: insulin aspart USE UP TO 40 UNITS PER DAY IN THE ACCUCHEK PUMP AS DIRECTED.   omega-3 acid ethyl esters 1 g capsule Commonly known as: LOVAZA Take by mouth.   propranolol 40 MG tablet Commonly known as: INDERAL Take 40 mg by mouth 2 (two) times daily.   triamcinolone 0.1 % paste Commonly known as: KENALOG SMARTSIG:Sparingly In Eye(s) PRN   vitamin B-12 1000 MCG tablet Commonly known as: CYANOCOBALAMIN Take by mouth.   vitamin E 180 MG (400 UNITS) capsule Take by mouth.       Allergies:  No Known Allergies  Family History: Family History  Problem Relation Age of Onset  . Breast cancer Mother 45  . Breast cancer Sister 13    Social History:   reports that she has never smoked. She has never used smokeless tobacco. She reports current alcohol use of about 7.0 standard drinks of alcohol per week. She reports that she does not use drugs.  Physical Exam:  BP 118/71 (BP Location: Left Arm, Patient Position: Sitting, Cuff Size: Normal)   Pulse 56   Temp 97.9 F (36.6 C) (Oral)   Ht '5\' 7"'  (1.702 m)   Wt 145 lb 8 oz (66 kg)   BMI 22.79 kg/m   Constitutional:  Alert and oriented, no acute distress, nontoxic appearing HEENT: [place], AT Cardiovascular: No clubbing, cyanosis, or edema Respiratory: Normal respiratory effort, no increased work of breathing Skin: No rashes, bruises or suspicious lesions Neurologic: Grossly intact, no focal deficits, moving all 4 extremities Psychiatric: Normal mood and affect  Laboratory Data: Results for orders placed or performed in visit on 11/06/19  Microscopic Examination   Urine  Result Value Ref Range    WBC, UA >30 (A) 0 - 5 /hpf   RBC 3-10 (A) 0 - 2 /hpf   Epithelial Cells (non renal) 0-10 0 - 10 /hpf   Renal Epithel, UA 0-10 (A) None seen /hpf   Bacteria, UA Many (A) None seen/Few  Urinalysis, Complete  Result Value Ref Range   Specific Gravity, UA 1.015 1.005 - 1.030   pH, UA 5.0 5.0 - 7.5   Color, UA Orange Yellow   Appearance Ur Cloudy (A) Clear   Leukocytes,UA 3+ (A) Negative   Protein,UA 2+ (A) Negative/Trace   Glucose, UA Trace (A) Negative   Ketones, UA 1+ (A) Negative   RBC, UA 2+ (A) Negative   Bilirubin, UA Negative Negative   Urobilinogen, Ur 2.0 (H) 0.2 - 1.0 mg/dL   Nitrite, UA Positive (A) Negative   Microscopic Examination See below:    Assessment & Plan:   1. Dysuria UA grossly infected today, however nitrites likely false positive with recent Pyridium use.  We will start her on empiric Macrobid and send for culture for further evaluation.  She will require 1 week lab visit for repeat UA to prove resolution of MH. - Urinalysis, Complete - CULTURE, URINE COMPREHENSIVE - nitrofurantoin, macrocrystal-monohydrate, (MACROBID) 100 MG capsule; Take 1 capsule (100 mg total) by mouth every 12 (twelve) hours for 5 days.  Dispense: 10 capsule; Refill: 0  2. Recurrent UTI Counseled her to continue topical vaginal estrogen cream 3 times weekly.  Counseled her to start twice daily cranberry supplements.  Refilling Pyridium per her request. - phenazopyridine (PYRIDIUM) 200 MG tablet; Take 1 tablet (200 mg total) by mouth 3 (three) times daily as needed for pain.  Dispense: 30 tablet; Refill: 2   Return in about 1 week (around 11/13/2019) for Lab visit for UA.  Debroah Loop, PA-C  [facility] [address], [place], [place] [address] [phone]

## 2019-11-06 NOTE — Patient Instructions (Signed)
Recurrent UTI Prevention Strategies  1. Start taking an over-the-counter cranberry supplement for urinary tract health. Take this once or twice daily on an empty stomach, e.g. right before bed. 2. Continue taking an over-the-counter probiotic, preferably containing lactobacillus. Take this daily. 3. Continue topical vaginal estrogen cream. Apply a pea-sized amount around the urethra three times weekly.

## 2019-11-07 LAB — MICROSCOPIC EXAMINATION: WBC, UA: >30 /hpf — AB (ref 0–5)

## 2019-11-07 LAB — URINALYSIS, COMPLETE
Bilirubin, UA: NEGATIVE
Nitrite, UA: POSITIVE — AB
Specific Gravity, UA: 1.015 (ref 1.005–1.030)
Urobilinogen, Ur: 2 mg/dL — ABNORMAL HIGH (ref 0.2–1.0)
pH, UA: 5 (ref 5.0–7.5)

## 2019-11-08 LAB — CULTURE, URINE COMPREHENSIVE

## 2019-11-13 ENCOUNTER — Telehealth: Payer: Self-pay | Admitting: Physician Assistant

## 2019-11-13 ENCOUNTER — Other Ambulatory Visit: Payer: Self-pay

## 2019-11-13 ENCOUNTER — Other Ambulatory Visit: Payer: PPO

## 2019-11-13 DIAGNOSIS — N390 Urinary tract infection, site not specified: Secondary | ICD-10-CM

## 2019-11-13 DIAGNOSIS — R3129 Other microscopic hematuria: Secondary | ICD-10-CM

## 2019-11-13 LAB — URINALYSIS, COMPLETE
Bilirubin, UA: NEGATIVE
Glucose, UA: NEGATIVE
Ketones, UA: NEGATIVE
Leukocytes,UA: NEGATIVE
Nitrite, UA: NEGATIVE
Protein,UA: NEGATIVE
RBC, UA: NEGATIVE
Specific Gravity, UA: 1.015 (ref 1.005–1.030)
Urobilinogen, Ur: 0.2 mg/dL (ref 0.2–1.0)
pH, UA: 7 (ref 5.0–7.5)

## 2019-11-13 LAB — MICROSCOPIC EXAMINATION: Bacteria, UA: NONE SEEN

## 2019-11-13 NOTE — Telephone Encounter (Signed)
Patient presented to clinic today for repeat UA to prove resolution of microscopic hematuria in the setting of her recent episode of acute cystitis.  She has completed culture appropriate antibiotics, however UA today notable for persistent mild microscopic hematuria.  I spoke to the patient via telephone today.  I explained that she continues to have some blood in her urine.  I explained that there are a myriad of factors that can cause blood in the urine including infection, stones, anticoagulation, renal cysts, as well as more serious pathologies including bladder or kidney cancers.  I explained that in the setting of persistent microscopic hematuria, I recommend that we proceed with a hematuria work-up at this time.  I explained that the work-up for blood in the urine includes a CT urogram with contrast to evaluate the upper part of her urinary tract as well as a cystoscopy in clinic to evaluate the lower part of her urinary tract.  I explained that these 2 components are complimentary and allow [place] to investigate her entire urinary tract thoroughly for possible explanations of the blood in her urine.  Patient is in agreement with proceeding with hematuria work-up at this time.  Notably, she is a never smoker with no known family history of bladder or kidney cancers.  I have placed an order for CTU and counseled the patient that the imaging department would contact her to get this scheduled.  Please schedule her for cystoscopy with CT results with one of our MDs in approximately 3 to 4 weeks.

## 2019-11-14 ENCOUNTER — Ambulatory Visit
Admission: RE | Admit: 2019-11-14 | Discharge: 2019-11-14 | Disposition: A | Discharge status: Home / Self Care | Payer: PPO | Source: Ambulatory Visit | Attending: Internal Medicine | Admitting: Internal Medicine

## 2019-11-14 DIAGNOSIS — Z1231 Encounter for screening mammogram for malignant neoplasm of breast: Secondary | ICD-10-CM | POA: Insufficient documentation

## 2019-11-14 DIAGNOSIS — Z0000 Encounter for general adult medical examination without abnormal findings: Secondary | ICD-10-CM | POA: Diagnosis not present

## 2019-11-14 NOTE — Telephone Encounter (Signed)
[facility] notifying patient of cysto appt. Appt made for 8/18 with Dr Bernardo Heater for cysto. Appt reminder mailed.

## 2019-11-15 DIAGNOSIS — E10649 Type 1 diabetes mellitus with hypoglycemia without coma: Secondary | ICD-10-CM | POA: Diagnosis not present

## 2019-11-20 DIAGNOSIS — E10649 Type 1 diabetes mellitus with hypoglycemia without coma: Secondary | ICD-10-CM | POA: Diagnosis not present

## 2019-11-24 ENCOUNTER — Ambulatory Visit
Admission: RE | Admit: 2019-11-24 | Discharge: 2019-11-24 | Disposition: A | Discharge status: Home / Self Care | Payer: PPO | Source: Ambulatory Visit | Attending: Physician Assistant | Admitting: Physician Assistant

## 2019-11-24 ENCOUNTER — Other Ambulatory Visit: Payer: Self-pay

## 2019-11-24 DIAGNOSIS — M5126 Other intervertebral disc displacement, lumbar region: Secondary | ICD-10-CM | POA: Diagnosis not present

## 2019-11-24 DIAGNOSIS — I700 Atherosclerosis of aorta: Secondary | ICD-10-CM | POA: Diagnosis not present

## 2019-11-24 DIAGNOSIS — I864 Gastric varices: Secondary | ICD-10-CM | POA: Diagnosis not present

## 2019-11-24 DIAGNOSIS — R3129 Other microscopic hematuria: Secondary | ICD-10-CM | POA: Diagnosis not present

## 2019-11-24 DIAGNOSIS — K449 Diaphragmatic hernia without obstruction or gangrene: Secondary | ICD-10-CM | POA: Diagnosis not present

## 2019-11-24 LAB — POCT I-STAT CREATININE: Creatinine, Ser: 1 mg/dL (ref 0.44–1.00)

## 2019-11-24 MED ORDER — IOHEXOL 300 MG/ML  SOLN
100.0000 mL | Freq: Once | INTRAMUSCULAR | Status: AC | PRN
  Administered 2019-11-24: 100 mL via INTRAVENOUS

## 2019-11-28 NOTE — Progress Notes (Signed)
   11/29/2019  CC:  Chief Complaint  Patient presents with  . Cysto    HPI: Alejandra Lowe is a 74 y.o. female with PMH recurrent UTI and atrophic vaginitis on topical vaginal presents today for evaluation.   -Patient was last seen by Debroah Loop, PA-C on 11/06/2019 for possible UTI. -Last UTI approximately 2 months ago.   -She denied fever, chills, nausea, vomiting, and gross hematuria.   -She had been taking Pyridium for management of her symptoms. -UA showed >30 WBCs/HPF, 3-10 RBCs/HPF, and many bacteria. -She was treated with Pyridium 200 mg TID prn.  -Persistent microhematuria after UTI treated -CT hematuria work up on 11/24/2019 no significant upper tract abnormalities   Blood pressure (!) 150/76, pulse (!) 54, height 5\' 7"  (1.702 m), weight 144 lb (65.3 kg). NED. A&Ox3.   No respiratory distress   Abd soft, NT, ND Atrophic external genitalia with patent urethral meatus  Cystoscopy Procedure Note  Patient identification was confirmed, informed consent was obtained, and patient was prepped using Betadine solution.  Lidocaine jelly was administered per urethral meatus.    Procedure: - Flexible cystoscope introduced, without any difficulty.   - Thorough search of the bladder revealed:    normal urethral meatus    normal urothelium    no stones    no ulcers     no tumors    no urethral polyps    no trabeculation  - Scattered cystitis at the trigone.  - Ureteral orifices were normal in position and appearance.  Post-Procedure: - Patient tolerated the procedure well  Assessment/ Plan:  1. 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 PA follow-up 6 months   I, Selena Batten, am acting as a scribe for Dr. Nicki Reaper C. Sylvana Bonk,  I have reviewed the above documentation for accuracy and completeness, and I agree with the above.   Abbie Sons, MD

## 2019-11-29 ENCOUNTER — Ambulatory Visit: Payer: PPO | Admitting: Urology

## 2019-11-29 ENCOUNTER — Other Ambulatory Visit: Payer: Self-pay

## 2019-11-29 ENCOUNTER — Encounter: Payer: Self-pay | Admitting: Urology

## 2019-11-29 VITALS — BP 150/76 | HR 54 | Ht 67.0 in | Wt 144.0 lb

## 2019-11-29 DIAGNOSIS — R3129 Other microscopic hematuria: Secondary | ICD-10-CM | POA: Diagnosis not present

## 2019-11-29 LAB — MICROSCOPIC EXAMINATION

## 2019-11-29 LAB — URINALYSIS, COMPLETE
Bilirubin, UA: NEGATIVE
Glucose, UA: NEGATIVE
Ketones, UA: NEGATIVE
Nitrite, UA: NEGATIVE
Protein,UA: NEGATIVE
Specific Gravity, UA: 1.025 (ref 1.005–1.030)
Urobilinogen, Ur: 0.2 mg/dL (ref 0.2–1.0)
pH, UA: 5.5 (ref 5.0–7.5)

## 2019-12-03 ENCOUNTER — Telehealth: Payer: Self-pay | Admitting: Urology

## 2019-12-03 NOTE — Telephone Encounter (Signed)
Patient seen for cystoscopy last week.  Her CT did show a small soft tissue density on the uterus which is most likely a fibroid.  Please find out if she has a gynecologist because I would like to see if GYN thinks this needs to be further evaluated.  Thanks

## 2019-12-04 NOTE — Telephone Encounter (Signed)
Notified patient of Dr Dene Gentry note below. She will follow up with a gynecologist.

## 2019-12-06 ENCOUNTER — Ambulatory Visit: Payer: PPO | Admitting: Physician Assistant

## 2019-12-06 ENCOUNTER — Other Ambulatory Visit: Payer: Self-pay

## 2019-12-06 VITALS — BP 148/77 | HR 57

## 2019-12-06 DIAGNOSIS — R300 Dysuria: Secondary | ICD-10-CM

## 2019-12-06 MED ORDER — AMOXICILLIN-POT CLAVULANATE 875-125 MG PO TABS: 1.0000 | ORAL_TABLET | Freq: Two times a day (BID) | ORAL | 0 refills | Status: AC

## 2019-12-06 NOTE — Progress Notes (Signed)
12/06/2019 4:43 PM   Alejandra Lowe [birth year] [id number]  CC: Chief Complaint  Patient presents with  . Dysuria    HPI: Alejandra Lowe is a 74 y.o. female with PMH recurrent UTI and atrophic vaginitis on topical vaginal estrogen cream who presents today for evaluation of possible UTI.  She underwent cystoscopy with Dr. Bernardo Heater on 11/29/2019 with findings of cystitis cystica.  Today she reports a 1 day history of dysuria, urgency, and frequency.  She took Azo this morning for symptomatic relief.  This would represent her fourth UTI in 4 months.  She continues to use topical vaginal estrogen cream, cranberry supplements, and eat [place] yogurt.  In-office UA today positive for trace-intact blood, nitrites, and trace leukocyte esterase; urine microscopy with 11-30 WBCs/HPF, 3-10 RBCs/HPF, and moderate bacteria.   PMH: Past Medical History:  Diagnosis Date  . Arthritis   . Breast cancer ([place]) 1997   left breast, radiation, chemo  . Diabetes mellitus without complication ([place])   . Glaucoma   . Hyperlipidemia   . Hypertension   . Personal history of chemotherapy 1997   BREAST CA  . Personal history of radiation therapy 1997   BREAST CA  . Seizure [facility])     Surgical History: Past Surgical History:  Procedure Laterality Date  . BREAST EXCISIONAL BIOPSY Left 1997   positive  . BREAST LUMPECTOMY Left 1997   W/ radiation  . ROTATOR CUFF REPAIR      Home Medications:  Allergies as of 12/06/2019   No Known Allergies     Medication List       Accurate as of December 06, 2019 11:59 PM. If you have any questions, ask your nurse or doctor.        amoxicillin-clavulanate 875-125 MG tablet Commonly known as: AUGMENTIN Take 1 tablet by mouth 2 (two) times daily for 7 days. Started by: Debroah Loop, PA-C   aspirin EC 81 MG tablet Take by mouth.   atorvastatin 10 MG tablet Commonly known as: LIPITOR TAKE 1/2 TABLETS (5 MG TOTAL) BY MOUTH TWICE A WEEK    calcium carbonate 600 MG Tabs tablet Commonly known as: OS-CAL Take by mouth.   Fish Oil 1000 MG Caps Take by mouth.   FreeStyle Libre 14 Day Reader Kerrin Mo Use 1 kit as directed [employer] 14 Day Sensor Misc USE 1 EACH EVERY 14 (FOURTEEN) DAYS E10.649   glucagon 1 MG injection Take as directed   NovoLOG 100 UNIT/ML injection Generic drug: insulin aspart USE UP TO 40 UNITS PER DAY IN THE ACCUCHEK PUMP AS DIRECTED.   omega-3 acid ethyl esters 1 g capsule Commonly known as: LOVAZA Take by mouth.   phenazopyridine 200 MG tablet Commonly known as: PYRIDIUM Take 1 tablet (200 mg total) by mouth 3 (three) times daily as needed for pain.   propranolol 40 MG tablet Commonly known as: INDERAL Take 40 mg by mouth 2 (two) times daily.   triamcinolone 0.1 % paste Commonly known as: KENALOG SMARTSIG:Sparingly In Eye(s) PRN   vitamin E 180 MG (400 UNITS) capsule Take by mouth.       Allergies:  No Known Allergies  Family History: Family History  Problem Relation Age of Onset  . Breast cancer Mother 39  . Breast cancer Sister 81    Social History:   reports that she has never smoked. She has never used smokeless tobacco. She reports current alcohol use of about 7.0 standard drinks of alcohol per week.  She reports that she does not use drugs.  Physical Exam: BP (!) 148/77   Pulse (!) 57   Constitutional:  Alert and oriented, no acute distress, nontoxic appearing HEENT: [place], AT Cardiovascular: No clubbing, cyanosis, or edema Respiratory: Normal respiratory effort, no increased work of breathing Skin: No rashes, bruises or suspicious lesions Neurologic: Grossly intact, no focal deficits, moving all 4 extremities Psychiatric: Normal mood and affect  Laboratory Data: Results for orders placed or performed in visit on 12/06/19  Microscopic Examination   Urine  Result Value Ref Range   WBC, UA 11-30 (A) 0 - 5 /hpf   RBC 3-10 (A) 0 - 2 /hpf   Epithelial  Cells (non renal) 0-10 0 - 10 /hpf   Renal Epithel, UA 0-10 (A) None seen /hpf   Bacteria, UA Moderate (A) None seen/Few  Urinalysis, Complete  Result Value Ref Range   Specific Gravity, UA 1.015 1.005 - 1.030   pH, UA 7.0 5.0 - 7.5   Color, UA Orange Yellow   Appearance Ur Cloudy (A) Clear   Leukocytes,UA Trace (A) Negative   Protein,UA Negative Negative/Trace   Glucose, UA Negative Negative   Ketones, UA Negative Negative   RBC, UA Trace (A) Negative   Bilirubin, UA Negative Negative   Urobilinogen, Ur 0.2 0.2 - 1.0 mg/dL   Nitrite, UA Positive (A) Negative   Microscopic Examination See below:    Assessment & Plan:   1. Dysuria Urine consistent with recurrent urinary tract infection today, will start patient on empiric Augmentin and send for culture for further evaluation.  Based on urine culture results, may consider starting her on a daily suppressive antibiotic given her frequency of infections despite topical vaginal estrogen cream, cranberry supplements, and probiotic yogurt intake.  We will defer repeat UA to prove resolution of MH in the setting of recent negative hematuria work-up. - Urinalysis, Complete - CULTURE, URINE COMPREHENSIVE - amoxicillin-clavulanate (AUGMENTIN) 875-125 MG tablet; Take 1 tablet by mouth 2 (two) times daily for 7 days.  Dispense: 14 tablet; Refill: 0   Return if symptoms worsen or fail to improve.  Debroah Loop, PA-C  [facility] [address] [place], [place] [address] [phone]

## 2019-12-07 LAB — URINALYSIS, COMPLETE
Bilirubin, UA: NEGATIVE
Glucose, UA: NEGATIVE
Ketones, UA: NEGATIVE
Nitrite, UA: POSITIVE — AB
Protein,UA: NEGATIVE
Specific Gravity, UA: 1.015 (ref 1.005–1.030)
Urobilinogen, Ur: 0.2 mg/dL (ref 0.2–1.0)
pH, UA: 7 (ref 5.0–7.5)

## 2019-12-07 LAB — MICROSCOPIC EXAMINATION

## 2019-12-09 LAB — CULTURE, URINE COMPREHENSIVE

## 2020-01-17 DIAGNOSIS — E785 Hyperlipidemia, unspecified: Secondary | ICD-10-CM | POA: Diagnosis not present

## 2020-01-17 DIAGNOSIS — I10 Essential (primary) hypertension: Secondary | ICD-10-CM | POA: Diagnosis not present

## 2020-01-17 DIAGNOSIS — E1022 Type 1 diabetes mellitus with diabetic chronic kidney disease: Secondary | ICD-10-CM | POA: Diagnosis not present

## 2020-01-17 DIAGNOSIS — N182 Chronic kidney disease, stage 2 (mild): Secondary | ICD-10-CM | POA: Diagnosis not present

## 2020-01-17 DIAGNOSIS — E10649 Type 1 diabetes mellitus with hypoglycemia without coma: Secondary | ICD-10-CM | POA: Diagnosis not present

## 2020-01-17 DIAGNOSIS — E1069 Type 1 diabetes mellitus with other specified complication: Secondary | ICD-10-CM | POA: Diagnosis not present

## 2020-01-24 DIAGNOSIS — E785 Hyperlipidemia, unspecified: Secondary | ICD-10-CM | POA: Diagnosis not present

## 2020-01-24 DIAGNOSIS — I10 Essential (primary) hypertension: Secondary | ICD-10-CM | POA: Diagnosis not present

## 2020-01-24 DIAGNOSIS — E1022 Type 1 diabetes mellitus with diabetic chronic kidney disease: Secondary | ICD-10-CM | POA: Diagnosis not present

## 2020-01-24 DIAGNOSIS — N182 Chronic kidney disease, stage 2 (mild): Secondary | ICD-10-CM | POA: Diagnosis not present

## 2020-01-24 DIAGNOSIS — L989 Disorder of the skin and subcutaneous tissue, unspecified: Secondary | ICD-10-CM | POA: Diagnosis not present

## 2020-01-24 DIAGNOSIS — E10649 Type 1 diabetes mellitus with hypoglycemia without coma: Secondary | ICD-10-CM | POA: Diagnosis not present

## 2020-01-24 DIAGNOSIS — E1069 Type 1 diabetes mellitus with other specified complication: Secondary | ICD-10-CM | POA: Diagnosis not present

## 2020-01-24 DIAGNOSIS — Z23 Encounter for immunization: Secondary | ICD-10-CM | POA: Diagnosis not present

## 2020-02-08 DIAGNOSIS — E10649 Type 1 diabetes mellitus with hypoglycemia without coma: Secondary | ICD-10-CM | POA: Diagnosis not present

## 2020-02-08 DIAGNOSIS — E1069 Type 1 diabetes mellitus with other specified complication: Secondary | ICD-10-CM | POA: Diagnosis not present

## 2020-02-08 DIAGNOSIS — E785 Hyperlipidemia, unspecified: Secondary | ICD-10-CM | POA: Diagnosis not present

## 2020-02-28 ENCOUNTER — Ambulatory Visit: Payer: PPO | Admitting: Physician Assistant

## 2020-02-28 ENCOUNTER — Other Ambulatory Visit: Payer: Self-pay

## 2020-02-28 ENCOUNTER — Encounter: Payer: Self-pay | Admitting: Physician Assistant

## 2020-02-28 VITALS — BP 131/71 | HR 54

## 2020-02-28 DIAGNOSIS — R300 Dysuria: Secondary | ICD-10-CM | POA: Diagnosis not present

## 2020-02-28 DIAGNOSIS — N390 Urinary tract infection, site not specified: Secondary | ICD-10-CM

## 2020-02-28 LAB — BLADDER SCAN AMB NON-IMAGING: Scan Result: 66

## 2020-02-28 MED ORDER — CEFUROXIME AXETIL 250 MG PO TABS: 250.0000 mg | ORAL_TABLET | Freq: Two times a day (BID) | ORAL | 0 refills | Status: AC

## 2020-02-28 MED ORDER — PREMARIN 0.625 MG/GM VA CREA: TOPICAL_CREAM | VAGINAL | 6 refills | Status: DC

## 2020-02-28 NOTE — Progress Notes (Signed)
02/28/2020 10:48 AM   Alejandra Lowe Dec 29, 1945 [id number]  CC: No chief complaint on file.   HPI: Alejandra Lowe is a 74 y.o. female with PMH recurrent UTI, atrophic vaginitis on topical vaginal estrogen cream, and microscopic hematuria with negative work-up in 2021 who presents today for evaluation of possible UTI.  I saw her in clinic most recently on 12/06/2019 for the same in the setting of recent cystoscopy with notable findings of cystitis cystica.  Urine culture grew ampicillin, Cipro, tetracycline, and Bactrim resistant E. coli and I treated her with Augmentin.  Today she reports a 1-day history of dysuria, urgency, and frequency. She denies fever, chills, nausea, vomiting, flank pain, and gross hematuria. She took Azo for treatment of her symptoms, most recently last night.  She continues to use topical vaginal estrogen cream, cranberry supplements, and probiotic yogurt.  In-office UA today positive for trace intact blood, 1+ protein, nitrites, and 1+ leukocyte esterase; urine microscopy with >30 WBCs/HPF, 3-10 RBCs/HPF, and many bacteria.  PVR 66 mL.  PMH: Past Medical History:  Diagnosis Date  . Arthritis   . Breast cancer ([place]) 1997   left breast, radiation, chemo  . Diabetes mellitus without complication ([place])   . Glaucoma   . Hyperlipidemia   . Hypertension   . Personal history of chemotherapy 1997   BREAST CA  . Personal history of radiation therapy 1997   BREAST CA  . Seizure [facility])     Surgical History: Past Surgical History:  Procedure Laterality Date  . BREAST EXCISIONAL BIOPSY Left 1997   positive  . BREAST LUMPECTOMY Left 1997   W/ radiation  . ROTATOR CUFF REPAIR      Home Medications:  Allergies as of 02/28/2020   No Known Allergies     Medication List       Accurate as of February 28, 2020 10:48 AM. If you have any questions, ask your nurse or doctor.        aspirin EC 81 MG tablet Take by mouth.   atorvastatin 10 MG  tablet Commonly known as: LIPITOR TAKE 1/2 TABLETS (5 MG TOTAL) BY MOUTH TWICE A WEEK   calcium carbonate 600 MG Tabs tablet Commonly known as: OS-CAL Take by mouth.   Fish Oil 1000 MG Caps Take by mouth.   FreeStyle Libre 14 Day Reader Kerrin Mo Use 1 kit as directed [employer] 14 Day Sensor Misc USE 1 EACH EVERY 14 (FOURTEEN) DAYS E10.649   glucagon 1 MG injection Take as directed   NovoLOG 100 UNIT/ML injection Generic drug: insulin aspart USE UP TO 40 UNITS PER DAY IN THE ACCUCHEK PUMP AS DIRECTED.   omega-3 acid ethyl esters 1 g capsule Commonly known as: LOVAZA Take by mouth.   phenazopyridine 200 MG tablet Commonly known as: PYRIDIUM Take 1 tablet (200 mg total) by mouth 3 (three) times daily as needed for pain.   propranolol 40 MG tablet Commonly known as: INDERAL Take 40 mg by mouth 2 (two) times daily.   triamcinolone 0.1 % paste Commonly known as: KENALOG SMARTSIG:Sparingly In Eye(s) PRN   vitamin E 180 MG (400 UNITS) capsule Take by mouth.       Allergies:  No Known Allergies  Family History: Family History  Problem Relation Age of Onset  . Breast cancer Mother 39  . Breast cancer Sister 29    Social History:   reports that she has never smoked. She has never used smokeless tobacco. She reports  current alcohol use of about 7.0 standard drinks of alcohol per week. She reports that she does not use drugs.  Physical Exam: There were no vitals taken for this visit.  Constitutional:  Alert and oriented, no acute distress, nontoxic appearing HEENT: [place], AT Cardiovascular: No clubbing, cyanosis, or edema Respiratory: Normal respiratory effort, no increased work of breathing GI: Abdomen is soft, nontender, nondistended, no abdominal masses GU: No CVA tenderness Lymph: No cervical or inguinal lymphadenopathy Skin: No rashes, bruises or suspicious lesions Neurologic: Grossly intact, no focal deficits, moving all 4  extremities Psychiatric: Normal mood and affect  Laboratory Data: Results for orders placed or performed in visit on 02/28/20  Microscopic Examination   Urine  Result Value Ref Range   WBC, UA >30 (A) 0 - 5 /hpf   RBC 3-10 (A) 0 - 2 /hpf   Epithelial Cells (non renal) 0-10 0 - 10 /hpf   Renal Epithel, UA 0-10 (A) None seen /hpf   Bacteria, UA Many (A) None seen/Few  Urinalysis, Complete  Result Value Ref Range   Specific Gravity, UA 1.015 1.005 - 1.030   pH, UA 7.0 5.0 - 7.5   Color, UA Orange Yellow   Appearance Ur Cloudy (A) Clear   Leukocytes,UA 1+ (A) Negative   Protein,UA 1+ (A) Negative/Trace   Glucose, UA Negative Negative   Ketones, UA Negative Negative   RBC, UA Trace (A) Negative   Bilirubin, UA Negative Negative   Urobilinogen, Ur 0.2 0.2 - 1.0 mg/dL   Nitrite, UA Positive (A) Negative   Microscopic Examination See below:   Bladder Scan (Post Void Residual) in office  Result Value Ref Range   Scan Result 66    Assessment & Plan:   1. Recurrent UTI UA notable for pyuria and bacteriuria today consistent with acute cystitis, nitrites likely false positive given recent Azo use.  Will start empiric cefuroxime and send for culture for further evaluation.  Refilling Premarin per patient request.  Patient to follow-up as needed.  Will defer repeat UA to prove resolution of MH given recent negative hematuria work-up. - Urinalysis, Complete - CULTURE, URINE COMPREHENSIVE - Bladder Scan (Post Void Residual) in office - cefUROXime (CEFTIN) 250 MG tablet; Take 1 tablet (250 mg total) by mouth 2 (two) times daily with a meal for 5 days.  Dispense: 10 tablet; Refill: 0 - conjugated estrogens (PREMARIN) vaginal cream; Apply a pea-sized amount around the opening to the urethra 3 times weekly.  Dispense: 30 g; Refill: 6   Return if symptoms worsen or fail to improve.  Debroah Loop, PA-C  [facility] [address], [place],  [place] [address] [phone]

## 2020-02-29 DIAGNOSIS — E10649 Type 1 diabetes mellitus with hypoglycemia without coma: Secondary | ICD-10-CM | POA: Diagnosis not present

## 2020-02-29 LAB — MICROSCOPIC EXAMINATION: WBC, UA: >30 /hpf — AB (ref 0–5)

## 2020-02-29 LAB — URINALYSIS, COMPLETE
Bilirubin, UA: NEGATIVE
Glucose, UA: NEGATIVE
Ketones, UA: NEGATIVE
Nitrite, UA: POSITIVE — AB
Specific Gravity, UA: 1.015 (ref 1.005–1.030)
Urobilinogen, Ur: 0.2 mg/dL (ref 0.2–1.0)
pH, UA: 7 (ref 5.0–7.5)

## 2020-03-02 LAB — CULTURE, URINE COMPREHENSIVE

## 2020-03-18 DIAGNOSIS — H40003 Preglaucoma, unspecified, bilateral: Secondary | ICD-10-CM | POA: Diagnosis not present

## 2020-03-18 DIAGNOSIS — E103211 Type 1 diabetes mellitus with mild nonproliferative diabetic retinopathy with macular edema, right eye: Secondary | ICD-10-CM | POA: Diagnosis not present

## 2020-05-27 ENCOUNTER — Ambulatory Visit: Payer: PPO | Admitting: Dermatology

## 2020-05-27 ENCOUNTER — Other Ambulatory Visit: Payer: Self-pay

## 2020-05-27 DIAGNOSIS — C44519 Basal cell carcinoma of skin of other part of trunk: Secondary | ICD-10-CM | POA: Diagnosis not present

## 2020-05-27 DIAGNOSIS — I8393 Asymptomatic varicose veins of bilateral lower extremities: Secondary | ICD-10-CM | POA: Diagnosis not present

## 2020-05-27 DIAGNOSIS — C4491 Basal cell carcinoma of skin, unspecified: Secondary | ICD-10-CM

## 2020-05-27 DIAGNOSIS — L578 Other skin changes due to chronic exposure to nonionizing radiation: Secondary | ICD-10-CM | POA: Diagnosis not present

## 2020-05-27 DIAGNOSIS — D489 Neoplasm of uncertain behavior, unspecified: Secondary | ICD-10-CM

## 2020-05-27 HISTORY — DX: Basal cell carcinoma of skin, unspecified: C44.91

## 2020-05-27 NOTE — Patient Instructions (Signed)
Biopsy Wound Care Instructions  1. Leave the original bandage on for 24 hours if possible.  If the bandage becomes soaked or soiled before that time, it is OK to remove it and examine the wound.  A small amount of post-operative bleeding is normal.  If excessive bleeding occurs, remove the bandage, place gauze over the site and apply continuous pressure (no peeking) over the area for 30 minutes. If this does not work, please call our clinic as soon as possible or page your doctor if it is after hours.   2. Once a day, cleanse the wound with soap and water. It is fine to shower. If a thick crust develops you may use a Q-tip dipped into dilute hydrogen peroxide (mix 1:1 with water) to dissolve it.  Hydrogen peroxide can slow the healing process, so use it only as needed.    3. After washing, apply petroleum jelly (Vaseline) or an antibiotic ointment if your doctor prescribed one for you, followed by a bandage.    4. For best healing, the wound should be covered with a layer of ointment at all times. If you are not able to keep the area covered with a bandage to hold the ointment in place, this may mean re-applying the ointment several times a day.  Continue this wound care until the wound has healed and is no longer open.   Itching and mild discomfort is normal during the healing process. However, if you develop pain or severe itching, please call our office.   If you have any discomfort, you can take Tylenol (acetaminophen) or ibuprofen as directed on the bottle. (Please do not take these if you have an allergy to them or cannot take them for another reason).  Some redness, tenderness and white or yellow material in the wound is normal healing.  If the area becomes very sore and red, or develops a thick yellow-green material (pus), it may be infected; please notify us.    If you have stitches, return to clinic as directed to have the stitches removed. You will continue wound care for 2-3 days after  the stitches are removed.   Wound healing continues for up to one year following surgery. It is not unusual to experience pain in the scar from time to time during the interval.  If the pain becomes severe or the scar thickens, you should notify the office.    A slight amount of redness in a scar is expected for the first six months.  After six months, the redness will fade and the scar will soften and fade.  The color difference becomes less noticeable with time.  If there are any problems, return for a post-op surgery check at your earliest convenience.  To improve the appearance of the scar, you can use silicone scar gel, cream, or sheets (such as Mederma or Serica) every night for up to one year. These are available over the counter (without a prescription).  Please call our office at [phone] for any questions or concerns.

## 2020-05-27 NOTE — Progress Notes (Unsigned)
   New Patient Visit  Subjective  Alejandra Lowe is a 75 y.o. female who presents for the following: New Patient (Initial Visit) (Patient here today concerning a lesion on back she states she thinks it has been there for years but can not see. Patient states her sister noticed it and thought it needed to check out. Patient states she is concerned about blood vessels on top of the feet. ).  Objective  Well appearing patient in no apparent distress; mood and affect are within normal limits.  A focused examination was performed including back, bilateral feet . Relevant physical exam findings are noted in the Assessment and Plan.  Objective  right upper back: 1.5 cm pink plaque      Assessment & Plan   Neoplasm of uncertain behavior right upper back  Skin / nail biopsy Type of biopsy: tangential   Informed consent: discussed and consent obtained   Timeout: patient name, date of birth, surgical site, and procedure verified   Procedure prep:  Patient was prepped and draped in usual sterile fashion Prep type:  Isopropyl alcohol Anesthesia: the lesion was anesthetized in a standard fashion   Anesthetic:  1% lidocaine w/ epinephrine 1-100,000 buffered w/ 8.4% NaHCO3 Instrument used: flexible razor blade   Hemostasis achieved with: pressure, aluminum chloride and electrodesiccation   Outcome: patient tolerated procedure well   Post-procedure details: sterile dressing applied and wound care instructions given   Dressing type: petrolatum and bandage    Specimen 1 - Surgical pathology Differential Diagnosis: r/o bcc  Check Margins: No 1.5 cm pink plaque  R/o bcc   Spider veins of both lower extremities Dorsum foot Dilated blood vessels - benign appearing nature discussed.  Discussed treatment options.  Pt declines treatment.  Actinic Damage - chronic, secondary to cumulative UV radiation exposure/sun exposure over time - diffuse scaly erythematous macules with underlying  dyspigmentation - Recommend daily broad spectrum sunscreen SPF 30+ to sun-exposed areas, reapply every 2 hours as needed.  - Call for new or changing lesions.  Return for follow up as needed .  IRuthell Rummage, CMA, am acting as scribe for Sarina Ser, MD.  Documentation: I have reviewed the above documentation for accuracy and completeness, and I agree with the above.  Sarina Ser, MD

## 2020-05-28 ENCOUNTER — Encounter: Payer: Self-pay | Admitting: Dermatology

## 2020-05-29 ENCOUNTER — Telehealth: Payer: Self-pay

## 2020-05-29 NOTE — Telephone Encounter (Signed)
-----   Message from Ralene Bathe, MD sent at 05/28/2020  6:53 PM EST ----- Diagnosis Skin , right upper back BASAL CELL CARCINOMA, NODULAR PATTERN  Cancer - BCC As suspected Schedule for treatment (EDC)

## 2020-05-29 NOTE — Telephone Encounter (Signed)
Patient advised of biopsy results and scheduled for EDC. 

## 2020-05-29 NOTE — Telephone Encounter (Signed)
Left message on voicemail to return my call.  

## 2020-06-03 ENCOUNTER — Ambulatory Visit: Payer: Self-pay | Admitting: Physician Assistant

## 2020-06-19 ENCOUNTER — Encounter: Payer: Self-pay | Admitting: Physician Assistant

## 2020-06-19 ENCOUNTER — Ambulatory Visit: Payer: PPO | Admitting: Physician Assistant

## 2020-06-19 ENCOUNTER — Other Ambulatory Visit: Payer: Self-pay

## 2020-06-19 VITALS — BP 146/80 | HR 54 | Ht 67.0 in | Wt 143.0 lb

## 2020-06-19 DIAGNOSIS — R300 Dysuria: Secondary | ICD-10-CM | POA: Diagnosis not present

## 2020-06-19 MED ORDER — NITROFURANTOIN MONOHYD MACRO 100 MG PO CAPS: 100.0000 mg | ORAL_CAPSULE | Freq: Two times a day (BID) | ORAL | 0 refills | Status: AC

## 2020-06-19 NOTE — Progress Notes (Signed)
06/19/2020 3:08 PM   Alejandra Lowe 01-09-1946 [id number]  CC: Chief Complaint  Patient presents with  . Dysuria    HPI: Alejandra Lowe is a 75 y.o. female with PMH recurrent UTI, atrophic vaginitis on topical vaginal estrogen cream, and microscopic hematuria with negative work-up in 2021 who presents today for evaluation of possible UTI.  Today she reports a 1 day history of dysuria, urgency, frequency, and urge incontinence.  She denies fever, chills, nausea, vomiting, flank pain, and gross hematuria.  She continues to use topical vaginal estrogen cream 3 times daily.  She has been eating dried cranberry fruit as well.  In-office UA today positive for orange color, trace glucose, 2+ ketones, 1+ blood, 1+ protein, nitrites, and 2+ leukocyte esterase; urine microscopy with >30 WBCs/HPF and 11-30 RBCs/HPF.  PMH: Past Medical History:  Diagnosis Date  . Arthritis   . Breast cancer ([place]) 1997   left breast, radiation, chemo  . Diabetes mellitus without complication ([place])   . Glaucoma   . Hyperlipidemia   . Hypertension   . Personal history of chemotherapy 1997   BREAST CA  . Personal history of radiation therapy 1997   BREAST CA  . Seizure [facility])     Surgical History: Past Surgical History:  Procedure Laterality Date  . BREAST EXCISIONAL BIOPSY Left 1997   positive  . BREAST LUMPECTOMY Left 1997   W/ radiation  . ROTATOR CUFF REPAIR      Home Medications:  Allergies as of 06/19/2020   No Known Allergies     Medication List       Accurate as of June 19, 2020  3:08 PM. If you have any questions, ask your nurse or doctor.        atorvastatin 10 MG tablet Commonly known as: LIPITOR   calcium carbonate 600 MG Tabs tablet Commonly known as: OS-CAL Take 600 mg by mouth daily with breakfast.   FreeStyle Libre 14 Day Reader Kerrin Mo Use 1 kit as directed [employer] 14 Day Sensor Misc USE 1 EACH EVERY 14 (FOURTEEN) DAYS E10.649   glucagon 1 MG  injection Take as directed   insulin aspart 100 UNIT/ML injection Commonly known as: novoLOG USE UP TO 40 UNITS PER DAY IN THE ACCUCHEK PUMP AS DIRECTED.   nitrofurantoin (macrocrystal-monohydrate) 100 MG capsule Commonly known as: MACROBID Take 1 capsule (100 mg total) by mouth every 12 (twelve) hours for 5 days. Started by: Debroah Loop, PA-C   omega-3 acid ethyl esters 1 g capsule Commonly known as: LOVAZA Take 1 g by mouth daily.   Premarin vaginal cream Generic drug: conjugated estrogens Apply a pea-sized amount around the opening to the urethra 3 times weekly.   vitamin E 180 MG (400 UNITS) capsule Take 400 Units by mouth daily.       Allergies:  No Known Allergies  Family History: Family History  Problem Relation Age of Onset  . Breast cancer Mother 23  . Breast cancer Sister 41    Social History:   reports that she has never smoked. She has never used smokeless tobacco. She reports current alcohol use of about 7.0 standard drinks of alcohol per week. She reports that she does not use drugs.  Physical Exam: BP (!) 146/80   Pulse (!) 54   Ht '5\' 7"'  (1.702 m)   Wt 143 lb (64.9 kg)   BMI 22.40 kg/m   Constitutional:  Alert and oriented, no acute distress, nontoxic appearing HEENT:  [place], AT Cardiovascular: No clubbing, cyanosis, or edema Respiratory: Normal respiratory effort, no increased work of breathing Skin: No rashes, bruises or suspicious lesions Neurologic: Grossly intact, no focal deficits, moving all 4 extremities Psychiatric: Normal mood and affect  Laboratory Data: Results for orders placed or performed in visit on 06/19/20  CULTURE, URINE COMPREHENSIVE   Specimen: Urine   UR  Result Value Ref Range   Urine Culture, Comprehensive Preliminary report (A)    Organism ID, Bacteria Gram negative rods (A)   Microscopic Examination   Urine  Result Value Ref Range   WBC, UA >30 (A) 0 - 5 /hpf   RBC 11-30 (A) 0 - 2 /hpf   Epithelial Cells  (non renal) 0-10 0 - 10 /hpf   Bacteria, UA Few None seen/Few  Urinalysis, Complete  Result Value Ref Range   Specific Gravity, UA 1.015 1.005 - 1.030   pH, UA 6.0 5.0 - 7.5   Color, UA Orange Yellow   Appearance Ur Cloudy (A) Clear   Leukocytes,UA 2+ (A) Negative   Protein,UA 1+ (A) Negative/Trace   Glucose, UA Trace (A) Negative   Ketones, UA 2+ (A) Negative   RBC, UA 1+ (A) Negative   Bilirubin, UA Negative Negative   Urobilinogen, Ur 1.0 0.2 - 1.0 mg/dL   Nitrite, UA Positive (A) Negative   Microscopic Examination See below:    Assessment & Plan:   1. Dysuria Patient clinically infected today, UA notable for pyuria and microscopic hematuria.  Will start empiric Macrobid and send for culture for further evaluation.  Will defer repeat UA to prove resolution of microscopic hematuria given negative work-up last year.  Patient is in agreement with this plan.  Counseled her to consider cranberry supplements rather than cranberry fruit for UTI prevention.  I explained that supplements have a higher concentration of healthy cranberry compounds for bladder support and are more palatable. - Urinalysis, Complete - CULTURE, URINE COMPREHENSIVE - nitrofurantoin, macrocrystal-monohydrate, (MACROBID) 100 MG capsule; Take 1 capsule (100 mg total) by mouth every 12 (twelve) hours for 5 days.  Dispense: 10 capsule; Refill: 0  Return if symptoms worsen or fail to improve.  Debroah Loop, PA-C  [facility] [address], [place], [place] [address] [phone]

## 2020-06-21 LAB — URINALYSIS, COMPLETE
Bilirubin, UA: NEGATIVE
Nitrite, UA: POSITIVE — AB
Specific Gravity, UA: 1.015 (ref 1.005–1.030)
Urobilinogen, Ur: 1 mg/dL (ref 0.2–1.0)
pH, UA: 6 (ref 5.0–7.5)

## 2020-06-21 LAB — MICROSCOPIC EXAMINATION: WBC, UA: >30 /HPF — AB (ref 0–5)

## 2020-06-22 LAB — CULTURE, URINE COMPREHENSIVE

## 2020-06-24 ENCOUNTER — Telehealth: Payer: Self-pay

## 2020-06-24 NOTE — Telephone Encounter (Signed)
Ok per DPR, LMOM notifying patient.  

## 2020-06-24 NOTE — Telephone Encounter (Signed)
-----   Message from Nori Riis, PA-C sent at 06/24/2020 10:31 AM EDT ----- Please let Mrs. Whetstone know that her urine culture was positive for infection and to finish the antibiotic that Sam prescribed for her.

## 2020-06-27 ENCOUNTER — Ambulatory Visit: Payer: PPO | Admitting: Dermatology

## 2020-06-27 ENCOUNTER — Ambulatory Visit: Payer: PPO | Admitting: Physician Assistant

## 2020-06-27 ENCOUNTER — Encounter: Payer: Self-pay | Admitting: Physician Assistant

## 2020-06-27 ENCOUNTER — Other Ambulatory Visit: Payer: Self-pay

## 2020-06-27 VITALS — BP 180/77 | HR 78 | Ht 67.0 in | Wt 142.0 lb

## 2020-06-27 DIAGNOSIS — R300 Dysuria: Secondary | ICD-10-CM | POA: Diagnosis not present

## 2020-06-27 DIAGNOSIS — N390 Urinary tract infection, site not specified: Secondary | ICD-10-CM | POA: Diagnosis not present

## 2020-06-27 LAB — URINALYSIS, COMPLETE: pH, UA: 5 (ref 5.0–7.5)

## 2020-06-27 LAB — MICROSCOPIC EXAMINATION: WBC, UA: >30 /hpf — AB (ref 0–5)

## 2020-06-27 MED ORDER — CEFUROXIME AXETIL 250 MG PO TABS: 250.0000 mg | ORAL_TABLET | Freq: Two times a day (BID) | ORAL | 0 refills | Status: AC

## 2020-06-27 NOTE — Progress Notes (Signed)
06/27/2020 9:40 AM   Alejandra Lowe 1945-09-01 [id number]  CC: Chief Complaint  Patient presents with  . Urinary Tract Infection    HPI: Alejandra Lowe is a 75 y.o. female with PMH recurrent UTI, atrophic vaginitis on topical vaginal estrogen cream, and microscopic hematuria with negative work-up in 2021 who presents today for evaluation of possible recurrent versus persistent UTI.  I saw her in clinic most recently 8 days ago for evaluation of UTI.  UA was grossly infected at that time and I started her on empiric Macrobid.  Her urine culture subsequently resulted with ampicillin, ciprofloxacin, tetracycline, and Bactrim resistant E. coli.  Today, patient reports her urinary symptoms improved but did not completely resolve on Macrobid.  She states her symptoms acutely worsened 1 day ago with frequency, urgency, dysuria, and urinary leakage.  She has taken 2 doses of Pyridium for management of her symptoms, most recently this morning.  CT urogram dated 11/24/2019 with no nephrolithiasis.   In-office UA today positive for red color and pigment interference; urine microscopy with >30 WBCs/HPF, 3-10 RBCs/HPF, and moderate bacteria.   PMH: Past Medical History:  Diagnosis Date  . Arthritis   . Breast cancer ([place]) 1997   left breast, radiation, chemo  . Diabetes mellitus without complication ([place])   . Glaucoma   . Hyperlipidemia   . Hypertension   . Personal history of chemotherapy 1997   BREAST CA  . Personal history of radiation therapy 1997   BREAST CA  . Seizure [facility])     Surgical History: Past Surgical History:  Procedure Laterality Date  . BREAST EXCISIONAL BIOPSY Left 1997   positive  . BREAST LUMPECTOMY Left 1997   W/ radiation  . ROTATOR CUFF REPAIR      Home Medications:  Allergies as of 06/27/2020   No Known Allergies     Medication List       Accurate as of June 27, 2020  9:40 AM. If you have any questions, ask your nurse or doctor.         atorvastatin 10 MG tablet Commonly known as: LIPITOR   calcium carbonate 600 MG Tabs tablet Commonly known as: OS-CAL Take 600 mg by mouth daily with breakfast.   cefUROXime 250 MG tablet Commonly known as: CEFTIN Take 1 tablet (250 mg total) by mouth 2 (two) times daily with a meal for 7 days. Started by: Debroah Loop, PA-C   FreeStyle Libre 14 Day Reader Kerrin Mo Use 1 kit as directed [employer] 14 Day Sensor Misc USE 1 EACH EVERY 14 (FOURTEEN) DAYS E10.649   glucagon 1 MG injection Take as directed   insulin aspart 100 UNIT/ML injection Commonly known as: novoLOG USE UP TO 40 UNITS PER DAY IN THE ACCUCHEK PUMP AS DIRECTED.   omega-3 acid ethyl esters 1 g capsule Commonly known as: LOVAZA Take 1 g by mouth daily.   Premarin vaginal cream Generic drug: conjugated estrogens Apply a pea-sized amount around the opening to the urethra 3 times weekly.   vitamin E 180 MG (400 UNITS) capsule Take 400 Units by mouth daily.       Allergies:  No Known Allergies  Family History: Family History  Problem Relation Age of Onset  . Breast cancer Mother 29  . Breast cancer Sister 21    Social History:   reports that she has never smoked. She has never used smokeless tobacco. She reports current alcohol use of about 7.0 standard drinks of  alcohol per week. She reports that she does not use drugs.  Physical Exam: BP (!) 180/77   Pulse 78   Ht '5\' 7"'  (1.702 m)   Wt 142 lb (64.4 kg)   BMI 22.24 kg/m   Constitutional:  Alert and oriented, no acute distress, nontoxic appearing HEENT: [place], AT Cardiovascular: No clubbing, cyanosis, or edema Respiratory: Normal respiratory effort, no increased work of breathing Skin: No rashes, bruises or suspicious lesions Neurologic: Grossly intact, no focal deficits, moving all 4 extremities Psychiatric: Normal mood and affect  Laboratory Data: Results for orders placed or performed in visit on 06/27/20  Microscopic  Examination   Urine  Result Value Ref Range   WBC, UA >30 (A) 0 - 5 /hpf   RBC 3-10 (A) 0 - 2 /hpf   Epithelial Cells (non renal) 0-10 0 - 10 /hpf   Renal Epithel, UA 0-10 (A) None seen /hpf   Bacteria, UA Moderate (A) None seen/Few  Urinalysis, Complete  Result Value Ref Range   Specific Gravity, UA CANCELED    pH, UA 5.0 5.0 - 7.5   Color, UA Red (A) Yellow   Appearance Ur Cloudy (A) Clear   Protein,UA CANCELED    Glucose, UA CANCELED    Ketones, UA CANCELED    Microscopic Examination See below:    Assessment & Plan:   1. Dysuria Persistent versus recurrent UTI in this patient with recurrent UTIs on topical vaginal estrogen cream.  Will treat with empiric cefuroxime for an extended course of 7 days and send for culture as well as atypical culture today for further evaluation of her symptoms.  As before, will defer repeat UA to prove resolution of microscopic hematuria given her known history of microscopic hematuria, no known urinary stones on recent CT urogram, and benign hematuria work-up within the last year. - Urinalysis, Complete - CULTURE, URINE COMPREHENSIVE - Mycoplasma / ureaplasma culture - cefUROXime (CEFTIN) 250 MG tablet; Take 1 tablet (250 mg total) by mouth 2 (two) times daily with a meal for 7 days.  Dispense: 14 tablet; Refill: 0  Return if symptoms worsen or fail to improve.  Debroah Loop, PA-C  [facility] [address], [place], [place] [address] [phone]

## 2020-07-01 LAB — CULTURE, URINE COMPREHENSIVE

## 2020-07-04 LAB — MYCOPLASMA / UREAPLASMA CULTURE
Mycoplasma hominis Culture: NEGATIVE
Ureaplasma urealyticum: NEGATIVE

## 2020-07-22 DIAGNOSIS — E785 Hyperlipidemia, unspecified: Secondary | ICD-10-CM | POA: Diagnosis not present

## 2020-07-22 DIAGNOSIS — I10 Essential (primary) hypertension: Secondary | ICD-10-CM | POA: Diagnosis not present

## 2020-07-22 DIAGNOSIS — E1069 Type 1 diabetes mellitus with other specified complication: Secondary | ICD-10-CM | POA: Diagnosis not present

## 2020-07-22 DIAGNOSIS — E10649 Type 1 diabetes mellitus with hypoglycemia without coma: Secondary | ICD-10-CM | POA: Diagnosis not present

## 2020-07-29 DIAGNOSIS — N182 Chronic kidney disease, stage 2 (mild): Secondary | ICD-10-CM | POA: Diagnosis not present

## 2020-07-29 DIAGNOSIS — Z0000 Encounter for general adult medical examination without abnormal findings: Secondary | ICD-10-CM | POA: Diagnosis not present

## 2020-07-29 DIAGNOSIS — I10 Essential (primary) hypertension: Secondary | ICD-10-CM | POA: Diagnosis not present

## 2020-07-29 DIAGNOSIS — E1022 Type 1 diabetes mellitus with diabetic chronic kidney disease: Secondary | ICD-10-CM | POA: Diagnosis not present

## 2020-08-09 ENCOUNTER — Other Ambulatory Visit: Payer: Self-pay

## 2020-08-09 ENCOUNTER — Ambulatory Visit: Payer: PPO | Admitting: Physician Assistant

## 2020-08-09 ENCOUNTER — Encounter: Payer: Self-pay | Admitting: Physician Assistant

## 2020-08-09 VITALS — BP 148/80 | HR 59 | Ht 66.0 in | Wt 141.0 lb

## 2020-08-09 DIAGNOSIS — R300 Dysuria: Secondary | ICD-10-CM | POA: Diagnosis not present

## 2020-08-09 LAB — URINALYSIS, COMPLETE
Bilirubin, UA: NEGATIVE
Nitrite, UA: POSITIVE — AB
Protein,UA: NEGATIVE
Specific Gravity, UA: 1.01 (ref 1.005–1.030)
Urobilinogen, Ur: 1 mg/dL (ref 0.2–1.0)
pH, UA: 6 (ref 5.0–7.5)

## 2020-08-09 LAB — MICROSCOPIC EXAMINATION

## 2020-08-09 NOTE — Progress Notes (Signed)
08/09/2020 2:09 PM   Alejandra Lowe [birth year] [id number]  CC: Chief Complaint  Patient presents with  . Dysuria    HPI: Alejandra Lowe is a 75 y.o. female with PMH recurrent UTI, atrophic vaginitis on topical vaginal estrogen cream, and microscopic hematuria with negative work-up in 2021 who presents today for evaluation of possible UTI.   Today she reports onset of dysuria, frequency, and urgency yesterday, however the dysuria has resolved today.  She denies fever, chills, nausea, and vomiting.  In-office UA today positive for orange color, trace glucose, 1+ ketones, trace lysed blood, nitrites, and trace leukocyte esterase; urine microscopy with 11-30 WBCs/HPF, 3-10 RBCs/HPF, and moderate bacteria.  PMH: Past Medical History:  Diagnosis Date  . Arthritis   . Breast cancer ([place]) 1997   left breast, radiation, chemo  . Diabetes mellitus without complication ([place])   . Glaucoma   . Hyperlipidemia   . Hypertension   . Personal history of chemotherapy 1997   BREAST CA  . Personal history of radiation therapy 1997   BREAST CA  . Seizure [facility])     Surgical History: Past Surgical History:  Procedure Laterality Date  . BREAST EXCISIONAL BIOPSY Left 1997   positive  . BREAST LUMPECTOMY Left 1997   W/ radiation  . ROTATOR CUFF REPAIR      Home Medications:  Allergies as of 08/09/2020   No Known Allergies     Medication List       Accurate as of August 09, 2020  2:09 PM. If you have any questions, ask your nurse or doctor.        atorvastatin 10 MG tablet Commonly known as: LIPITOR   calcium carbonate 600 MG Tabs tablet Commonly known as: OS-CAL Take 600 mg by mouth daily with breakfast.   FreeStyle Libre 14 Day Reader Kerrin Mo Use 1 kit as directed [employer] 14 Day Sensor Misc USE 1 EACH EVERY 14 (FOURTEEN) DAYS E10.649   glucagon 1 MG injection Take as directed   insulin aspart 100 UNIT/ML injection Commonly known as: novoLOG USE UP TO  40 UNITS PER DAY IN THE ACCUCHEK PUMP AS DIRECTED.   omega-3 acid ethyl esters 1 g capsule Commonly known as: LOVAZA Take 1 g by mouth daily.   Premarin vaginal cream Generic drug: conjugated estrogens Apply a pea-sized amount around the opening to the urethra 3 times weekly.   vitamin E 180 MG (400 UNITS) capsule Take 400 Units by mouth daily.       Allergies:  No Known Allergies  Family History: Family History  Problem Relation Age of Onset  . Breast cancer Mother 40  . Breast cancer Sister 51    Social History:   reports that she has never smoked. She has never used smokeless tobacco. She reports current alcohol use of about 7.0 standard drinks of alcohol per week. She reports that she does not use drugs.  Physical Exam: There were no vitals taken for this visit.  Constitutional:  Alert and oriented, no acute distress, nontoxic appearing HEENT: [place], AT Cardiovascular: No clubbing, cyanosis, or edema Respiratory: Normal respiratory effort, no increased work of breathing Skin: No rashes, bruises or suspicious lesions Neurologic: Grossly intact, no focal deficits, moving all 4 extremities Psychiatric: Normal mood and affect  Laboratory Data: Results for orders placed or performed in visit on 08/09/20  CULTURE, URINE COMPREHENSIVE   Specimen: Urine   UR  Result Value Ref Range   Urine Culture, Comprehensive  Preliminary report (A)    Organism ID, Bacteria Gram negative rods (A)   Microscopic Examination   Urine  Result Value Ref Range   WBC, UA 11-30 (A) 0 - 5 /hpf   RBC 3-10 (A) 0 - 2 /hpf   Epithelial Cells (non renal) 0-10 0 - 10 /hpf   Bacteria, UA Moderate (A) None seen/Few  Urinalysis, Complete  Result Value Ref Range   Specific Gravity, UA 1.010 1.005 - 1.030   pH, UA 6.0 5.0 - 7.5   Color, UA Orange Yellow   Appearance Ur Cloudy (A) Clear   Leukocytes,UA Trace (A) Negative   Protein,UA Negative Negative/Trace   Glucose, UA Trace (A) Negative    Ketones, UA 1+ (A) Negative   RBC, UA Trace (A) Negative   Bilirubin, UA Negative Negative   Urobilinogen, Ur 1.0 0.2 - 1.0 mg/dL   Nitrite, UA Positive (A) Negative   Microscopic Examination See below:    Assessment & Plan:   1. Dysuria Now resolved.  UA consistent with infection, though with false positive nitrate in the setting of Azo use.  With dysuria resolved, I recommended sending urine for culture and waiting results and symptom recheck in 3 days.  Patient is in agreement with this plan.  Patient to call the office on Monday.  If dysuria has returned at that point, okay to treat with culture appropriate antibiotics - Urinalysis, Complete - CULTURE, URINE COMPREHENSIVE   Return if symptoms worsen or fail to improve.  Debroah Loop, PA-C  [facility] [address] [place], [place] [address] [phone]

## 2020-08-09 NOTE — Patient Instructions (Signed)
Please call our office on Monday (or send me a MyChart message) to let me know if your burning has returned. If so, we'll treat you with antibiotics.

## 2020-08-12 ENCOUNTER — Telehealth: Payer: Self-pay

## 2020-08-12 MED ORDER — NITROFURANTOIN MONOHYD MACRO 100 MG PO CAPS: 100.0000 mg | ORAL_CAPSULE | Freq: Two times a day (BID) | ORAL | 0 refills | Status: AC

## 2020-08-12 NOTE — Telephone Encounter (Signed)
Pt LVM on triage line stating that she was told to call back on Monday to report symptoms. Patient states that she is in pain and would like an antibiotic to be sent in. Please advise.

## 2020-08-12 NOTE — Telephone Encounter (Signed)
Patient notified, expressed understanding.  

## 2020-08-12 NOTE — Telephone Encounter (Signed)
Macrobid sent to [facility] on [facility]; please notify patient.

## 2020-08-13 DIAGNOSIS — E785 Hyperlipidemia, unspecified: Secondary | ICD-10-CM | POA: Diagnosis not present

## 2020-08-13 DIAGNOSIS — E1069 Type 1 diabetes mellitus with other specified complication: Secondary | ICD-10-CM | POA: Diagnosis not present

## 2020-08-13 DIAGNOSIS — E10649 Type 1 diabetes mellitus with hypoglycemia without coma: Secondary | ICD-10-CM | POA: Diagnosis not present

## 2020-08-14 LAB — CULTURE, URINE COMPREHENSIVE

## 2020-08-15 ENCOUNTER — Other Ambulatory Visit: Payer: Self-pay

## 2020-08-15 ENCOUNTER — Ambulatory Visit: Payer: PPO | Admitting: Dermatology

## 2020-08-15 DIAGNOSIS — C44519 Basal cell carcinoma of skin of other part of trunk: Secondary | ICD-10-CM

## 2020-08-15 DIAGNOSIS — C44612 Basal cell carcinoma of skin of right upper limb, including shoulder: Secondary | ICD-10-CM

## 2020-08-15 NOTE — Patient Instructions (Addendum)

## 2020-08-15 NOTE — Progress Notes (Signed)
   Follow-Up Visit   Subjective  Alejandra Lowe is a 75 y.o. female who presents for the following: Follow-up (Treatment of nodular pattern BCC diagnostic biopsy 05-27-20 right upper back).  The following portions of the chart were reviewed this encounter and updated as appropriate:   Tobacco  Allergies  Meds  Problems  Med Hx  Surg Hx  Fam Hx     Review of Systems:  No other skin or systemic complaints except as noted in HPI or Assessment and Plan.  Objective  Well appearing patient in no apparent distress; mood and affect are within normal limits.  A focused examination was performed including back . Relevant physical exam findings are noted in the Assessment and Plan.  Objective  Right Upper Back: Pink pearly papule or plaque with arborizing vessels.    Assessment & Plan  Basal cell carcinoma (BCC) of skin of right upper extremity including shoulder Right Upper Back  Destruction of lesion  Destruction method: electrodesiccation and curettage   Informed consent: discussed and consent obtained   Timeout:  patient name, date of birth, surgical site, and procedure verified Anesthesia: the lesion was anesthetized in a standard fashion   Anesthetic:  1% lidocaine w/ epinephrine 1-100,000 buffered w/ 8.4% NaHCO3 Curettage performed in three different directions: Yes   Electrodesiccation performed over the curetted area: Yes   Curettage cycles:  3 Lesion length (cm):  1.8 Lesion width (cm):  1.8 Margin per side (cm):  0.2 Final wound size (cm):  2.2 Hemostasis achieved with:  electrodesiccation Outcome: patient tolerated procedure well with no complications   Post-procedure details: sterile dressing applied and wound care instructions given   Dressing type: petrolatum    Return in about 6 months (around 02/15/2021) for TBSE, Hx of BCC.  IMarye Round, CMA, am acting as scribe for Sarina Ser, MD .  Documentation: I have reviewed the above documentation for  accuracy and completeness, and I agree with the above.  Sarina Ser, MD

## 2020-08-19 ENCOUNTER — Encounter: Payer: Self-pay | Admitting: Dermatology

## 2020-08-29 ENCOUNTER — Other Ambulatory Visit: Payer: Self-pay

## 2020-08-29 ENCOUNTER — Encounter: Payer: Self-pay | Admitting: Physician Assistant

## 2020-08-29 ENCOUNTER — Ambulatory Visit: Payer: PPO | Admitting: Physician Assistant

## 2020-08-29 VITALS — BP 135/73 | HR 81 | Ht 66.0 in | Wt 141.0 lb

## 2020-08-29 DIAGNOSIS — N390 Urinary tract infection, site not specified: Secondary | ICD-10-CM

## 2020-08-29 DIAGNOSIS — R300 Dysuria: Secondary | ICD-10-CM | POA: Diagnosis not present

## 2020-08-29 MED ORDER — NITROFURANTOIN MONOHYD MACRO 100 MG PO CAPS
100.0000 mg | ORAL_CAPSULE | Freq: Every day | ORAL | 2 refills | Status: DC
Start: 2020-08-29 — End: 2020-12-05

## 2020-08-29 MED ORDER — CEFUROXIME AXETIL 250 MG PO TABS: 250.0000 mg | ORAL_TABLET | Freq: Two times a day (BID) | ORAL | 0 refills | Status: AC

## 2020-08-29 NOTE — Patient Instructions (Signed)
Start cefuroxime today to treat your current infection. Start Macrobid once daily the day after you finish cefuroxime to prevent UTIs. You'll take the Macrobid once daily for three months.

## 2020-08-29 NOTE — Progress Notes (Signed)
08/29/2020 2:00 PM   Alejandra Lowe November 06, 1945 [id number]  CC: Chief Complaint  Patient presents with  . Dysuria    HPI: Alejandra Lowe is a 75 y.o. female with PMH recurrent UTI, atrophic vaginitis on topical vaginal estrogen cream, and microscopic hematuria with negative work-up in 2021 who presents today for evaluation of possible UTI.  Today she reports a 1 day history of dysuria, urgency, frequency, and hand pain consistent with past infections.  She denies fever, chills, nausea, and vomiting.  She took Azo to help with her symptoms.  Notably, patient has had increased frequency of urinary infection, with this representing her fourth infection in 2-1/2 months.  She has been diligent with topical vaginal estrogen use and is frustrated with her increased frequency of infection.  Her CT urogram dated 11/24/2019 showed no evidence of urolithiasis.  In-office UA unable to read due to pigment interference; urine microscopy with >30 WBCs/HPF, amorphous crystals, and few bacteria.   PMH: Past Medical History:  Diagnosis Date  . Arthritis   . Breast cancer ([place]) 1997   left breast, radiation, chemo  . Diabetes mellitus without complication ([place])   . Glaucoma   . Hyperlipidemia   . Hypertension   . Personal history of chemotherapy 1997   BREAST CA  . Personal history of radiation therapy 1997   BREAST CA  . Seizure [facility])     Surgical History: Past Surgical History:  Procedure Laterality Date  . BREAST EXCISIONAL BIOPSY Left 1997   positive  . BREAST LUMPECTOMY Left 1997   W/ radiation  . ROTATOR CUFF REPAIR      Home Medications:  Allergies as of 08/29/2020   No Known Allergies     Medication List       Accurate as of Aug 29, 2020  2:00 PM. If you have any questions, ask your nurse or doctor.        atorvastatin 10 MG tablet Commonly known as: LIPITOR   calcium carbonate 600 MG Tabs tablet Commonly known as: OS-CAL Take 600 mg by mouth daily with  breakfast.   FreeStyle Libre 14 Day Reader Kerrin Mo Use 1 kit as directed [employer] 14 Day Sensor Misc USE 1 EACH EVERY 14 (FOURTEEN) DAYS E10.649   glucagon 1 MG injection Take as directed   insulin aspart 100 UNIT/ML injection Commonly known as: novoLOG USE UP TO 40 UNITS PER DAY IN THE ACCUCHEK PUMP AS DIRECTED.   omega-3 acid ethyl esters 1 g capsule Commonly known as: LOVAZA Take 1 g by mouth daily.   Premarin vaginal cream Generic drug: conjugated estrogens Apply a pea-sized amount around the opening to the urethra 3 times weekly.   vitamin E 180 MG (400 UNITS) capsule Take 400 Units by mouth daily.       Allergies:  No Known Allergies  Family History: Family History  Problem Relation Age of Onset  . Breast cancer Mother 50  . Breast cancer Sister 79    Social History:   reports that she has never smoked. She has never used smokeless tobacco. She reports current alcohol use of about 7.0 standard drinks of alcohol per week. She reports that she does not use drugs.  Physical Exam: BP 135/73   Pulse 81   Ht _0  (1.676 m)   Wt 141 lb (64 kg)   BMI 22.76 kg/m   Constitutional:  Alert and oriented, no acute distress, nontoxic appearing HEENT: [place], AT Cardiovascular: No clubbing,  cyanosis, or edema Respiratory: Normal respiratory effort, no increased work of breathing Skin: No rashes, bruises or suspicious lesions Neurologic: Grossly intact, no focal deficits, moving all 4 extremities Psychiatric: Normal mood and affect  Laboratory Data: Results for orders placed or performed in visit on 08/29/20  Urinalysis  Result Value Ref Range   Specific Gravity, UA CANCELED    pH, UA CANCELED    Color, UA Red (A) Yellow   Appearance Ur Cloudy (A) Clear   Protein,UA CANCELED    Glucose, UA CANCELED    Ketones, UA CANCELED    Assessment & Plan:   1. Recurrent UTI UA today notable for pyuria, will start empiric cefuroxime and send for culture for  further evaluation.  At this point, I recommended a 61-monthcourse of suppressive antibiotics to break the cycle of infection and she agreed.  Counseled her to start daily Macrobid the day after she completes her treatment course of cefuroxime.  I additionally offered her repeat cystoscopy, however she wishes to defer this at this time.  I think this is reasonable. - Urinalysis - CULTURE, URINE COMPREHENSIVE - cefUROXime (CEFTIN) 250 MG tablet; Take 1 tablet (250 mg total) by mouth 2 (two) times daily with a meal for 7 days.  Dispense: 14 tablet; Refill: 0 - nitrofurantoin, macrocrystal-monohydrate, (MACROBID) 100 MG capsule; Take 1 capsule (100 mg total) by mouth daily.  Dispense: 30 capsule; Refill: 2   Return if symptoms worsen or fail to improve.  SDebroah Loop PA-C  [facility] [address] [place] [phone]

## 2020-08-30 LAB — URINALYSIS

## 2020-08-30 LAB — URINALYSIS, COMPLETE

## 2020-08-30 LAB — MICROSCOPIC EXAMINATION: WBC, UA: >30 /hpf — AB (ref 0–5)

## 2020-09-05 DIAGNOSIS — E10649 Type 1 diabetes mellitus with hypoglycemia without coma: Secondary | ICD-10-CM | POA: Diagnosis not present

## 2020-09-05 LAB — CULTURE, URINE COMPREHENSIVE

## 2020-09-11 DIAGNOSIS — E083213 Diabetes mellitus due to underlying condition with mild nonproliferative diabetic retinopathy with macular edema, bilateral: Secondary | ICD-10-CM | POA: Diagnosis not present

## 2020-12-02 NOTE — Telephone Encounter (Signed)
Per Sam's note, she stated she wanted a three month course of the suppressive antibiotic.  So I will need to defer to [facility] when she returns.

## 2020-12-04 ENCOUNTER — Other Ambulatory Visit: Payer: Self-pay

## 2020-12-04 ENCOUNTER — Other Ambulatory Visit: Payer: Self-pay | Admitting: Physician Assistant

## 2020-12-04 DIAGNOSIS — N390 Urinary tract infection, site not specified: Secondary | ICD-10-CM

## 2020-12-05 ENCOUNTER — Other Ambulatory Visit: Payer: PPO

## 2020-12-05 ENCOUNTER — Other Ambulatory Visit: Payer: Self-pay

## 2020-12-05 DIAGNOSIS — N390 Urinary tract infection, site not specified: Secondary | ICD-10-CM | POA: Diagnosis not present

## 2020-12-06 LAB — URINALYSIS, COMPLETE
Bilirubin, UA: NEGATIVE
Specific Gravity, UA: 1.015 (ref 1.005–1.030)
Urobilinogen, Ur: 4 mg/dL — ABNORMAL HIGH (ref 0.2–1.0)
pH, UA: 5.5 (ref 5.0–7.5)

## 2020-12-06 LAB — MICROSCOPIC EXAMINATION
Epithelial Cells (non renal): >10 /hpf — AB (ref 0–10)
WBC, UA: >30 /hpf — AB (ref 0–5)

## 2020-12-09 LAB — CULTURE, URINE COMPREHENSIVE

## 2020-12-17 DIAGNOSIS — E10649 Type 1 diabetes mellitus with hypoglycemia without coma: Secondary | ICD-10-CM | POA: Diagnosis not present

## 2020-12-26 ENCOUNTER — Other Ambulatory Visit: Payer: Self-pay | Admitting: Physician Assistant

## 2020-12-26 DIAGNOSIS — N390 Urinary tract infection, site not specified: Secondary | ICD-10-CM

## 2020-12-26 MED ORDER — NITROFURANTOIN MONOHYD MACRO 100 MG PO CAPS: ORAL_CAPSULE | ORAL | 2 refills | Status: DC

## 2021-01-21 DIAGNOSIS — I10 Essential (primary) hypertension: Secondary | ICD-10-CM | POA: Diagnosis not present

## 2021-01-21 DIAGNOSIS — N182 Chronic kidney disease, stage 2 (mild): Secondary | ICD-10-CM | POA: Diagnosis not present

## 2021-01-21 DIAGNOSIS — E1022 Type 1 diabetes mellitus with diabetic chronic kidney disease: Secondary | ICD-10-CM | POA: Diagnosis not present

## 2021-01-28 DIAGNOSIS — I10 Essential (primary) hypertension: Secondary | ICD-10-CM | POA: Diagnosis not present

## 2021-01-28 DIAGNOSIS — Z23 Encounter for immunization: Secondary | ICD-10-CM | POA: Diagnosis not present

## 2021-01-28 DIAGNOSIS — E785 Hyperlipidemia, unspecified: Secondary | ICD-10-CM | POA: Diagnosis not present

## 2021-01-28 DIAGNOSIS — N182 Chronic kidney disease, stage 2 (mild): Secondary | ICD-10-CM | POA: Diagnosis not present

## 2021-01-28 DIAGNOSIS — E1069 Type 1 diabetes mellitus with other specified complication: Secondary | ICD-10-CM | POA: Diagnosis not present

## 2021-01-28 DIAGNOSIS — E1022 Type 1 diabetes mellitus with diabetic chronic kidney disease: Secondary | ICD-10-CM | POA: Diagnosis not present

## 2021-02-19 DIAGNOSIS — E785 Hyperlipidemia, unspecified: Secondary | ICD-10-CM | POA: Diagnosis not present

## 2021-02-19 DIAGNOSIS — E10649 Type 1 diabetes mellitus with hypoglycemia without coma: Secondary | ICD-10-CM | POA: Diagnosis not present

## 2021-02-19 DIAGNOSIS — E1069 Type 1 diabetes mellitus with other specified complication: Secondary | ICD-10-CM | POA: Diagnosis not present

## 2021-02-24 ENCOUNTER — Ambulatory Visit: Payer: PPO | Admitting: Dermatology

## 2021-02-24 ENCOUNTER — Other Ambulatory Visit: Payer: Self-pay

## 2021-02-24 DIAGNOSIS — Z1283 Encounter for screening for malignant neoplasm of skin: Secondary | ICD-10-CM

## 2021-02-24 DIAGNOSIS — L578 Other skin changes due to chronic exposure to nonionizing radiation: Secondary | ICD-10-CM | POA: Diagnosis not present

## 2021-02-24 DIAGNOSIS — D1801 Hemangioma of skin and subcutaneous tissue: Secondary | ICD-10-CM | POA: Diagnosis not present

## 2021-02-24 DIAGNOSIS — D2272 Melanocytic nevi of left lower limb, including hip: Secondary | ICD-10-CM

## 2021-02-24 DIAGNOSIS — Z85828 Personal history of other malignant neoplasm of skin: Secondary | ICD-10-CM | POA: Diagnosis not present

## 2021-02-24 DIAGNOSIS — Z853 Personal history of malignant neoplasm of breast: Secondary | ICD-10-CM | POA: Diagnosis not present

## 2021-02-24 DIAGNOSIS — L821 Other seborrheic keratosis: Secondary | ICD-10-CM | POA: Diagnosis not present

## 2021-02-24 DIAGNOSIS — L814 Other melanin hyperpigmentation: Secondary | ICD-10-CM

## 2021-02-24 DIAGNOSIS — D485 Neoplasm of uncertain behavior of skin: Secondary | ICD-10-CM

## 2021-02-24 DIAGNOSIS — D239 Other benign neoplasm of skin, unspecified: Secondary | ICD-10-CM

## 2021-02-24 HISTORY — DX: Other benign neoplasm of skin, unspecified: D23.9

## 2021-02-24 NOTE — Patient Instructions (Addendum)
Wound Care Instructions  Cleanse wound gently with soap and water once a day then pat dry with clean gauze. Apply a thing coat of Petrolatum (petroleum jelly, "Vaseline") over the wound (unless you have an allergy to this). We recommend that you use a new, sterile tube of Vaseline. Do not pick or remove scabs. Do not remove the yellow or white "healing tissue" from the base of the wound.  Cover the wound with fresh, clean, nonstick gauze and secure with paper tape. You may use Band-Aids in place of gauze and tape if the would is small enough, but would recommend trimming much of the tape off as there is often too much. Sometimes Band-Aids can irritate the skin.  You should call the office for your biopsy report after 1 week if you have not already been contacted.  If you experience any problems, such as abnormal amounts of bleeding, swelling, significant bruising, significant pain, or evidence of infection, please call the office immediately.   Melanoma ABCDEs  Melanoma is the most dangerous type of skin cancer, and is the leading cause of death from skin disease.  You are more likely to develop melanoma if you: Have light-colored skin, light-colored eyes, or red or blond hair Spend a lot of time in the sun Tan regularly, either outdoors or in a tanning bed Have had blistering sunburns, especially during childhood Have a close family member who has had a melanoma Have atypical moles or large birthmarks  Early detection of melanoma is key since treatment is typically straightforward and cure rates are extremely high if we catch it early.   The first sign of melanoma is often a change in a mole or a new dark spot.  The ABCDE system is a way of remembering the signs of melanoma.  A for asymmetry:  The two halves do not match. B for border:  The edges of the growth are irregular. C for color:  A mixture of colors are present instead of an even brown color. D for diameter:  Melanomas are  usually (but not always) greater than 39mm - the size of a pencil eraser. E for evolution:  The spot keeps changing in size, shape, and color.  Please check your skin once per month between visits. You can use a small mirror in front and a large mirror behind you to keep an eye on the back side or your body.   If you see any new or changing lesions before your next follow-up, please call to schedule a visit.  Please continue daily skin protection including broad spectrum sunscreen SPF 30+ to sun-exposed areas, reapplying every 2 hours as needed when you're outdoors.    If you have any questions or concerns for your doctor, please call our main line at [phone] and press option 4 to reach your doctor's medical assistant. If no one answers, please leave a voicemail as directed and we will return your call as soon as possible. Messages left after 4 pm will be answered the following business day.   You may also send [place] a message via [place]. We typically respond to MyChart messages within 1-2 business days.  For prescription refills, please ask your pharmacy to contact our office. Our fax number is [phone].  If you have an urgent issue when the clinic is closed that cannot wait until the next business day, you can page your doctor at the number below.    Please note that while we do our best to be  available for urgent issues outside of office hours, we are not available 24/7.   If you have an urgent issue and are unable to reach [place], you may choose to seek medical care at your doctor's office, retail clinic, urgent care center, or emergency room.  If you have a medical emergency, please immediately call 911 or go to the emergency department.  Pager Numbers  - Dr. Nehemiah Massed: [phone]  - Dr. Laurence Ferrari: [phone]  - Dr. Nicole Kindred: [phone]  In the event of inclement weather, please call our main line at [phone] for an update on the status of any delays or  closures.  Dermatology Medication Tips: Please keep the boxes that topical medications come in in order to help keep track of the instructions about where and how to use these. Pharmacies typically print the medication instructions only on the boxes and not directly on the medication tubes.   If your medication is too expensive, please contact our office at [phone] option 4 or send [place] a message through [place].   We are unable to tell what your co-pay for medications will be in advance as this is different depending on your insurance coverage. However, we may be able to find a substitute medication at lower cost or fill out paperwork to get insurance to cover a needed medication.   If a prior authorization is required to get your medication covered by your insurance company, please allow [place] 1-2 business days to complete this process.  Drug prices often vary depending on where the prescription is filled and some pharmacies may offer cheaper prices.  The website www.goodrx.com contains coupons for medications through different pharmacies. The prices here do not account for what the cost may be with help from insurance (it may be cheaper with your insurance), but the website can give you the price if you did not use any insurance.  - You can print the associated coupon and take it with your prescription to the pharmacy.  - You may also stop by our office during regular business hours and pick up a GoodRx coupon card.  - If you need your prescription sent electronically to a different pharmacy, notify our office through [facility] or by phone at [phone] option 4.

## 2021-02-24 NOTE — Progress Notes (Signed)
Follow-Up Visit   Subjective  Alejandra Lowe is a 75 y.o. female who presents for the following: Annual Exam (Patient here for full body skin exam and skin cancer screening. Patient with hx of BCC. She is not aware of any new or changing spots. ).  The following portions of the chart were reviewed this encounter and updated as appropriate:   Tobacco  Allergies  Meds  Problems  Med Hx  Surg Hx  Fam Hx     Review of Systems:  No other skin or systemic complaints except as noted in HPI or Assessment and Plan.  Objective  Well appearing patient in no apparent distress; mood and affect are within normal limits.  A full examination was performed including scalp, head, eyes, ears, nose, lips, neck, chest, axillae, abdomen, back, buttocks, bilateral upper extremities, bilateral lower extremities, hands, feet, fingers, toes, fingernails, and toenails. All findings within normal limits unless otherwise noted below.  Left Breast   Left Distal Anterior Thigh 0.6 cm irregular brown macule        Assessment & Plan  History of breast cancer Left Breast clear No lymphadenopathy   Neoplasm of uncertain behavior of skin Left Distal Anterior Thigh Epidermal / dermal shaving Lesion diameter (cm):  0.6 Informed consent: discussed and consent obtained   Timeout: patient name, date of birth, surgical site, and procedure verified   Procedure prep:  Patient was prepped and draped in usual sterile fashion Prep type:  Isopropyl alcohol Anesthesia: the lesion was anesthetized in a standard fashion   Anesthetic:  1% lidocaine w/ epinephrine 1-100,000 buffered w/ 8.4% NaHCO3 Instrument used: flexible razor blade   Hemostasis achieved with: pressure, aluminum chloride and electrodesiccation   Outcome: patient tolerated procedure well   Post-procedure details: sterile dressing applied and wound care instructions given   Dressing type: bandage and petrolatum    Specimen 1 - Surgical  pathology Differential Diagnosis: Nevus vs Dysplastic Nevus Check Margins: No 0.6 cm irregular brown macule  Skin cancer screening  Lentigines - Scattered tan macules - Due to sun exposure - Benign-appearing, observe - Recommend daily broad spectrum sunscreen SPF 30+ to sun-exposed areas, reapply every 2 hours as needed. - Call for any changes  Seborrheic Keratoses - Stuck-on, waxy, tan-brown papules and/or plaques  - Benign-appearing - Discussed benign etiology and prognosis. - Observe - Call for any changes  Melanocytic Nevi - Tan-brown and/or pink-flesh-colored symmetric macules and papules - Benign appearing on exam today - Observation - Call clinic for new or changing moles - Recommend daily use of broad spectrum spf 30+ sunscreen to sun-exposed areas.   Hemangiomas - Red papules - Discussed benign nature - Observe - Call for any changes  Actinic Damage - Chronic condition, secondary to cumulative UV/sun exposure - diffuse scaly erythematous macules with underlying dyspigmentation - Recommend daily broad spectrum sunscreen SPF 30+ to sun-exposed areas, reapply every 2 hours as needed.  - Staying in the shade or wearing long sleeves, sun glasses (UVA+UVB protection) and wide brim hats (4-inch brim around the entire circumference of the hat) are also recommended for sun protection.  - Call for new or changing lesions.  Skin cancer screening performed today.  History of Basal Cell Carcinoma of the Skin - No evidence of recurrence today at right upper back - Recommend regular full body skin exams - Recommend daily broad spectrum sunscreen SPF 30+ to sun-exposed areas, reapply every 2 hours as needed.  - Call if any new or changing lesions  are noted between office visits  Return in about 1 year (around 02/24/2022) for TBSE.  Graciella Belton, RMA, am acting as scribe for Sarina Ser, MD . Documentation: I have reviewed the above documentation for accuracy and  completeness, and I agree with the above.  Sarina Ser, MD

## 2021-03-02 ENCOUNTER — Encounter: Payer: Self-pay | Admitting: Dermatology

## 2021-03-05 ENCOUNTER — Telehealth: Payer: Self-pay

## 2021-03-05 NOTE — Telephone Encounter (Signed)
Patient advised of BX results.  °

## 2021-03-05 NOTE — Telephone Encounter (Signed)
Left message for patient to call office for results/hd 

## 2021-03-05 NOTE — Telephone Encounter (Signed)
-----   Message from Ralene Bathe, MD sent at 02/26/2021  6:00 PM EST ----- Diagnosis Skin , left distal anterior thigh DYSPLASTIC COMPOUND NEVUS WITH MODERATE ATYPIA, LIMITED MARGINS FREE  Moderate dysplastic Recheck next visit

## 2021-03-25 ENCOUNTER — Other Ambulatory Visit: Payer: Self-pay | Admitting: Physician Assistant

## 2021-03-25 DIAGNOSIS — N390 Urinary tract infection, site not specified: Secondary | ICD-10-CM

## 2021-03-25 MED ORDER — NITROFURANTOIN MONOHYD MACRO 100 MG PO CAPS: ORAL_CAPSULE | ORAL | 2 refills | Status: DC

## 2021-03-27 DIAGNOSIS — E10649 Type 1 diabetes mellitus with hypoglycemia without coma: Secondary | ICD-10-CM | POA: Diagnosis not present

## 2021-07-16 DIAGNOSIS — E10649 Type 1 diabetes mellitus with hypoglycemia without coma: Secondary | ICD-10-CM | POA: Diagnosis not present

## 2021-07-29 DIAGNOSIS — I10 Essential (primary) hypertension: Secondary | ICD-10-CM | POA: Diagnosis not present

## 2021-07-29 DIAGNOSIS — E1022 Type 1 diabetes mellitus with diabetic chronic kidney disease: Secondary | ICD-10-CM | POA: Diagnosis not present

## 2021-07-29 DIAGNOSIS — N182 Chronic kidney disease, stage 2 (mild): Secondary | ICD-10-CM | POA: Diagnosis not present

## 2021-08-05 DIAGNOSIS — Z1231 Encounter for screening mammogram for malignant neoplasm of breast: Secondary | ICD-10-CM | POA: Diagnosis not present

## 2021-08-05 DIAGNOSIS — Z0000 Encounter for general adult medical examination without abnormal findings: Secondary | ICD-10-CM | POA: Diagnosis not present

## 2021-08-05 DIAGNOSIS — E1069 Type 1 diabetes mellitus with other specified complication: Secondary | ICD-10-CM | POA: Diagnosis not present

## 2021-08-05 DIAGNOSIS — E1022 Type 1 diabetes mellitus with diabetic chronic kidney disease: Secondary | ICD-10-CM | POA: Diagnosis not present

## 2021-08-05 DIAGNOSIS — N182 Chronic kidney disease, stage 2 (mild): Secondary | ICD-10-CM | POA: Diagnosis not present

## 2021-08-05 DIAGNOSIS — I10 Essential (primary) hypertension: Secondary | ICD-10-CM | POA: Diagnosis not present

## 2021-08-05 DIAGNOSIS — E785 Hyperlipidemia, unspecified: Secondary | ICD-10-CM | POA: Diagnosis not present

## 2021-08-06 ENCOUNTER — Other Ambulatory Visit: Payer: Self-pay | Admitting: Internal Medicine

## 2021-08-06 DIAGNOSIS — Z1231 Encounter for screening mammogram for malignant neoplasm of breast: Secondary | ICD-10-CM

## 2021-08-07 ENCOUNTER — Ambulatory Visit
Admission: RE | Admit: 2021-08-07 | Discharge: 2021-08-07 | Disposition: A | Discharge status: Home / Self Care | Payer: PPO | Source: Ambulatory Visit | Attending: Internal Medicine | Admitting: Internal Medicine

## 2021-08-07 DIAGNOSIS — Z1231 Encounter for screening mammogram for malignant neoplasm of breast: Secondary | ICD-10-CM | POA: Diagnosis not present

## 2021-08-27 DIAGNOSIS — E785 Hyperlipidemia, unspecified: Secondary | ICD-10-CM | POA: Diagnosis not present

## 2021-08-27 DIAGNOSIS — E10649 Type 1 diabetes mellitus with hypoglycemia without coma: Secondary | ICD-10-CM | POA: Diagnosis not present

## 2021-08-27 DIAGNOSIS — E1069 Type 1 diabetes mellitus with other specified complication: Secondary | ICD-10-CM | POA: Diagnosis not present

## 2021-10-15 DIAGNOSIS — E10649 Type 1 diabetes mellitus with hypoglycemia without coma: Secondary | ICD-10-CM | POA: Diagnosis not present

## 2021-10-22 DIAGNOSIS — E103313 Type 1 diabetes mellitus with moderate nonproliferative diabetic retinopathy with macular edema, bilateral: Secondary | ICD-10-CM | POA: Diagnosis not present

## 2021-11-13 DIAGNOSIS — E1069 Type 1 diabetes mellitus with other specified complication: Secondary | ICD-10-CM | POA: Diagnosis not present

## 2021-11-13 DIAGNOSIS — E10649 Type 1 diabetes mellitus with hypoglycemia without coma: Secondary | ICD-10-CM | POA: Diagnosis not present

## 2021-11-13 DIAGNOSIS — E785 Hyperlipidemia, unspecified: Secondary | ICD-10-CM | POA: Diagnosis not present

## 2021-11-15 DIAGNOSIS — E10649 Type 1 diabetes mellitus with hypoglycemia without coma: Secondary | ICD-10-CM | POA: Diagnosis not present

## 2021-11-20 ENCOUNTER — Ambulatory Visit: Payer: PPO | Admitting: Physician Assistant

## 2021-11-20 VITALS — BP 136/74 | HR 64 | Ht 67.0 in | Wt 136.0 lb

## 2021-11-20 DIAGNOSIS — N390 Urinary tract infection, site not specified: Secondary | ICD-10-CM

## 2021-11-20 DIAGNOSIS — R3129 Other microscopic hematuria: Secondary | ICD-10-CM

## 2021-11-20 LAB — URINALYSIS, COMPLETE
Bilirubin, UA: NEGATIVE
Nitrite, UA: POSITIVE — AB
Specific Gravity, UA: 1.01 (ref 1.005–1.030)
Urobilinogen, Ur: 2 mg/dL — ABNORMAL HIGH (ref 0.2–1.0)
pH, UA: 6 (ref 5.0–7.5)

## 2021-11-20 LAB — MICROSCOPIC EXAMINATION: WBC, UA: >30 /hpf — AB (ref 0–5)

## 2021-11-20 MED ORDER — CEFUROXIME AXETIL 250 MG PO TABS: 250.0000 mg | ORAL_TABLET | Freq: Two times a day (BID) | ORAL | 0 refills | Status: AC

## 2021-11-20 NOTE — Progress Notes (Signed)
11/20/2021 1:33 PM   Alejandra Lowe February 26, 1946 [id number]  CC: Chief Complaint  Patient presents with   Dysuria   Recurrent UTI   HPI: Alejandra Lowe is a 76 y.o. female with PMH recurrent UTI, atrophic vaginitis on topical vaginal estrogen cream, and microscopic hematuria with negative workup in 2021 who presents today for evaluation of possible UTI.   Today she reports a 2 to 3-day history of pain in her hands with voiding as well as urgency and frequency over baseline.  She continues to use estrogen cream 2-3 times per week.  She previously was on daily suppressive Macrobid but we stopped it after 9 months.  She would have completed that prescription around 5 months ago.  She has not had a UTI since.  She denies episodes of gross hematuria or flank pain within the last year.  In-office UA today positive for trace glucose, 1+ ketones, trace intact blood, 1+ protein, 2.0 EU/DL urobilinogen, nitrites, and 3+ leukocyte esterase; urine microscopy with >30 WBCs/HPF, 3-10 RBCs/HPF, granular casts, and many bacteria.  PMH: Past Medical History:  Diagnosis Date   Arthritis    Basal cell carcinoma 05/27/2020   R upper back - ED&C   Breast cancer ([place]) 1997   left breast, radiation, chemo   Diabetes mellitus without complication ([place])    Dysplastic nevus 02/24/2021   Left distal ant thigh   Glaucoma    Hyperlipidemia    Hypertension    Personal history of chemotherapy 1997   BREAST CA   Personal history of radiation therapy 1997   BREAST CA   Seizure [facility])     Surgical History: Past Surgical History:  Procedure Laterality Date   BREAST EXCISIONAL BIOPSY Left 1997   positive   BREAST LUMPECTOMY Left 1997   W/ radiation   ROTATOR CUFF REPAIR      Home Medications:  Allergies as of 11/20/2021   No Known Allergies      Medication List        Accurate as of November 20, 2021  1:33 PM. If you have any questions, ask your nurse or doctor.          atorvastatin  10 MG tablet Commonly known as: LIPITOR   calcium carbonate 600 MG Tabs tablet Commonly known as: OS-CAL Take 600 mg by mouth daily with breakfast.   cefUROXime 250 MG tablet Commonly known as: CEFTIN Take 1 tablet (250 mg total) by mouth 2 (two) times daily with a meal for 7 days.   FreeStyle Libre 14 Day Reader Kerrin Mo Use 1 kit as directed [employer] 14 Day Sensor Misc USE 1 EACH EVERY 14 (FOURTEEN) DAYS E10.649   glucagon 1 MG injection Take as directed   insulin aspart 100 UNIT/ML injection Commonly known as: novoLOG USE UP TO 40 UNITS PER DAY IN THE ACCUCHEK PUMP AS DIRECTED.   nitrofurantoin (macrocrystal-monohydrate) 100 MG capsule Commonly known as: MACROBID TAKE 1 CAPSULE BY MOUTH EVERY DAY   omega-3 acid ethyl esters 1 g capsule Commonly known as: LOVAZA Take 1 g by mouth daily.   Premarin vaginal cream Generic drug: conjugated estrogens Apply a pea-sized amount around the opening to the urethra 3 times weekly.   vitamin E 180 MG (400 UNITS) capsule Take 400 Units by mouth daily.        Allergies:  No Known Allergies  Family History: Family History  Problem Relation Age of Onset   Breast cancer Mother 110  Breast cancer Sister 17    Social History:   reports that she has never smoked. She has never used smokeless tobacco. She reports current alcohol use of about 7.0 standard drinks of alcohol per week. She reports that she does not use drugs.  Physical Exam: BP 136/74   Pulse 64   Ht '5\' 7"'  (1.702 m)   Wt 136 lb (61.7 kg)   BMI 21.30 kg/m   Constitutional:  Alert and oriented, no acute distress, nontoxic appearing HEENT: [place], AT Cardiovascular: No clubbing, cyanosis, or edema Respiratory: Normal respiratory effort, no increased work of breathing Skin: No rashes, bruises or suspicious lesions Neurologic: Grossly intact, no focal deficits, moving all 4 extremities Psychiatric: Normal mood and affect  Laboratory Data: Results  for orders placed or performed in visit on 11/20/21  Microscopic Examination   Urine  Result Value Ref Range   WBC, UA >30 (A) 0 - 5 /hpf   RBC, Urine 3-10 (A) 0 - 2 /hpf   Epithelial Cells (non renal) 0-10 0 - 10 /hpf   Casts Present (A) None seen /lpf   Cast Type Granular casts (A) N/A   Bacteria, UA Many (A) None seen/Few  Urinalysis, Complete  Result Value Ref Range   Specific Gravity, UA 1.010 1.005 - 1.030   pH, UA 6.0 5.0 - 7.5   Color, UA Orange Yellow   Appearance Ur Clear Clear   Leukocytes,UA 3+ (A) Negative   Protein,UA 1+ (A) Negative/Trace   Glucose, UA Trace (A) Negative   Ketones, UA 1+ (A) Negative   RBC, UA Trace (A) Negative   Bilirubin, UA Negative Negative   Urobilinogen, Ur 2.0 (H) 0.2 - 1.0 mg/dL   Nitrite, UA Positive (A) Negative   Microscopic Examination See below:    Assessment & Plan:   1. Recurrent UTI UA appears grossly infected today, will start empiric cefuroxime and send for culture for further evaluation.  This is her first UTI in greater than 1 year.  We discussed that if her UTIs begin to recur more frequently, we may consider resuming suppressive Macrobid.  She is in agreement with this plan.  Counseled her to continue estrogen cream in the interim. - Urinalysis, Complete - CULTURE, URINE COMPREHENSIVE - cefUROXime (CEFTIN) 250 MG tablet; Take 1 tablet (250 mg total) by mouth 2 (two) times daily with a meal for 7 days.  Dispense: 14 tablet; Refill: 0  2. Microscopic hematuria Noted on UA today, likely secondary to #1 above.  She had a negative hematuria workup 2 years ago.  In the absence of new flank pain or gross hematuria over the past year, will defer repeat UA to prove resolution of hematuria.  We discussed that if her hematuria persists, we may repeat UA hematuria workup next year.  Return if symptoms worsen or fail to improve.  Debroah Loop, PA-C  [facility] [address] [place], [place] [address] [phone]

## 2021-11-24 LAB — CULTURE, URINE COMPREHENSIVE

## 2021-11-27 DIAGNOSIS — E1069 Type 1 diabetes mellitus with other specified complication: Secondary | ICD-10-CM | POA: Diagnosis not present

## 2021-11-27 DIAGNOSIS — E785 Hyperlipidemia, unspecified: Secondary | ICD-10-CM | POA: Diagnosis not present

## 2021-11-27 DIAGNOSIS — E10649 Type 1 diabetes mellitus with hypoglycemia without coma: Secondary | ICD-10-CM | POA: Diagnosis not present

## 2021-12-25 ENCOUNTER — Encounter: Payer: Self-pay | Admitting: Physician Assistant

## 2021-12-25 ENCOUNTER — Ambulatory Visit: Payer: PPO | Admitting: Physician Assistant

## 2021-12-25 VITALS — BP 138/70 | HR 58 | Temp 98.2°F | Ht 67.0 in | Wt 136.4 lb

## 2021-12-25 DIAGNOSIS — N390 Urinary tract infection, site not specified: Secondary | ICD-10-CM | POA: Diagnosis not present

## 2021-12-25 DIAGNOSIS — R3129 Other microscopic hematuria: Secondary | ICD-10-CM | POA: Diagnosis not present

## 2021-12-25 LAB — URINALYSIS, COMPLETE
Bilirubin, UA: NEGATIVE
Glucose, UA: NEGATIVE
Nitrite, UA: POSITIVE — AB
RBC, UA: NEGATIVE
Specific Gravity, UA: 1.02 (ref 1.005–1.030)
Urobilinogen, Ur: 0.2 mg/dL (ref 0.2–1.0)
pH, UA: 6 (ref 5.0–7.5)

## 2021-12-25 LAB — MICROSCOPIC EXAMINATION
Epithelial Cells (non renal): >10 /hpf — AB (ref 0–10)
WBC, UA: >30 /hpf — AB (ref 0–5)

## 2021-12-25 MED ORDER — CEFUROXIME AXETIL 250 MG PO TABS: 250.0000 mg | ORAL_TABLET | Freq: Two times a day (BID) | ORAL | 0 refills | Status: AC

## 2021-12-25 MED ORDER — PHENAZOPYRIDINE HCL 200 MG PO TABS: 200.0000 mg | ORAL_TABLET | Freq: Three times a day (TID) | ORAL | 0 refills | Status: DC | PRN

## 2021-12-25 MED ORDER — NITROFURANTOIN MONOHYD MACRO 100 MG PO CAPS: 100.0000 mg | ORAL_CAPSULE | Freq: Every day | ORAL | 11 refills | Status: DC

## 2021-12-25 NOTE — Progress Notes (Signed)
12/25/2021 4:53 PM   Alejandra Lowe 27-Dec-1945 [id number]  CC: Chief Complaint  Patient presents with   Urinary Tract Infection   HPI: Alejandra Lowe is a 76 y.o. female with PMH recurrent UTI, atrophic vaginitis on topical vaginal estrogen cream, and microscopic hematuria with negative work-up in 2021 who presents today for valuation of possible UTI.   Today she reports a 2-week history of dysuria, urgency, and frequency.  She started taking [place] brand supplements which have helped with her symptoms but not resolved them.  We previously stopped suppressive Macrobid after 9 months of therapy and she was infection free for 5 months afterward.  This is now her second UTI in 2 months.  In-office UA today positive for trace ketones, trace protein, nitrites, and 1+ leukocytes; urine microscopy with >30 WBCs/HPF, >10 epithelial cells/hpf, and many bacteria.  PMH: Past Medical History:  Diagnosis Date   Arthritis    Basal cell carcinoma 05/27/2020   R upper back - ED&C   Breast cancer ([place]) 1997   left breast, radiation, chemo   Diabetes mellitus without complication ([place])    Dysplastic nevus 02/24/2021   Left distal ant thigh   Glaucoma    Hyperlipidemia    Hypertension    Personal history of chemotherapy 1997   BREAST CA   Personal history of radiation therapy 1997   BREAST CA   Seizure [facility])     Surgical History: Past Surgical History:  Procedure Laterality Date   BREAST EXCISIONAL BIOPSY Left 1997   positive   BREAST LUMPECTOMY Left 1997   W/ radiation   ROTATOR CUFF REPAIR      Home Medications:  Allergies as of 12/25/2021   No Known Allergies      Medication List        Accurate as of December 25, 2021  4:53 PM. If you have any questions, ask your nurse or doctor.          atorvastatin 10 MG tablet Commonly known as: LIPITOR   calcium carbonate 600 MG Tabs tablet Commonly known as: OS-CAL Take 600 mg by mouth daily with breakfast.    cefUROXime 250 MG tablet Commonly known as: CEFTIN Take 1 tablet (250 mg total) by mouth 2 (two) times daily with a meal for 5 days. Started by: Debroah Loop, PA-C   FreeStyle Libre 14 Day Reader Kerrin Mo Use 1 kit as directed [employer] 14 Day Sensor Misc USE 1 EACH EVERY 14 (FOURTEEN) DAYS E10.649   glucagon 1 MG injection Take as directed   insulin aspart 100 UNIT/ML injection Commonly known as: novoLOG USE UP TO 40 UNITS PER DAY IN THE ACCUCHEK PUMP AS DIRECTED.   nitrofurantoin (macrocrystal-monohydrate) 100 MG capsule Commonly known as: MACROBID Take 1 capsule (100 mg total) by mouth daily. What changed:  how much to take how to take this when to take this additional instructions Changed by: Debroah Loop, PA-C   omega-3 acid ethyl esters 1 g capsule Commonly known as: LOVAZA Take 1 g by mouth daily.   phenazopyridine 200 MG tablet Commonly known as: Pyridium Take 1 tablet (200 mg total) by mouth 3 (three) times daily as needed for pain. Do not take for longer than 2 days continuously. Started by: Debroah Loop, PA-C   Premarin vaginal cream Generic drug: conjugated estrogens Apply a pea-sized amount around the opening to the urethra 3 times weekly.   vitamin E 180 MG (400 UNITS) capsule Take 400 Units by  mouth daily.        Allergies:  No Known Allergies  Family History: Family History  Problem Relation Age of Onset   Breast cancer Mother 49   Breast cancer Sister 78    Social History:   reports that she has never smoked. She has never used smokeless tobacco. She reports current alcohol use of about 7.0 standard drinks of alcohol per week. She reports that she does not use drugs.  Physical Exam: BP 138/70   Pulse (!) 58   Temp 98.2 F (36.8 C)   Ht _0  (1.702 m)   Wt 136 lb 6.4 oz (61.9 kg)   BMI 21.36 kg/m   Constitutional:  Alert and oriented, no acute distress, nontoxic appearing HEENT: [place],  AT Cardiovascular: No clubbing, cyanosis, or edema Respiratory: Normal respiratory effort, no increased work of breathing Skin: No rashes, bruises or suspicious lesions Neurologic: Grossly intact, no focal deficits, moving all 4 extremities Psychiatric: Normal mood and affect  Laboratory Data: Results for orders placed or performed in visit on 12/25/21  Microscopic Examination   Urine  Result Value Ref Range   WBC, UA >30 (A) 0 - 5 /hpf   RBC, Urine 0-2 0 - 2 /hpf   Epithelial Cells (non renal) >10 (A) 0 - 10 /hpf   Bacteria, UA Many (A) None seen/Few  Urinalysis, Complete  Result Value Ref Range   Specific Gravity, UA 1.020 1.005 - 1.030   pH, UA 6.0 5.0 - 7.5   Color, UA Yellow Yellow   Appearance Ur Clear Clear   Leukocytes,UA 1+ (A) Negative   Protein,UA Trace (A) Negative/Trace   Glucose, UA Negative Negative   Ketones, UA Trace (A) Negative   RBC, UA Negative Negative   Bilirubin, UA Negative Negative   Urobilinogen, Ur 0.2 0.2 - 1.0 mg/dL   Nitrite, UA Positive (A) Negative   Microscopic Examination See below:    Assessment & Plan:   1. Recurrent UTI UA is grossly infected today without microscopic hematuria, though sample does appear to be contaminated.  Will start empiric cefuroxime and send for culture for further evaluation.  We discussed restarting suppressive Macrobid and she would like to do this.  We will plan to keep her on it for 1 year and then see how she does when she stops it.  Also refilling Pyridium per patient request if she develops any breakthrough UTIs. - Urinalysis, Complete - CULTURE, URINE COMPREHENSIVE - cefUROXime (CEFTIN) 250 MG tablet; Take 1 tablet (250 mg total) by mouth 2 (two) times daily with a meal for 5 days.  Dispense: 10 tablet; Refill: 0 - nitrofurantoin, macrocrystal-monohydrate, (MACROBID) 100 MG capsule; Take 1 capsule (100 mg total) by mouth daily.  Dispense: 30 capsule; Refill: 11 - phenazopyridine (PYRIDIUM) 200 MG tablet; Take  1 tablet (200 mg total) by mouth 3 (three) times daily as needed for pain. Do not take for longer than 2 days continuously.  Dispense: 30 tablet; Refill: 0  Return if symptoms worsen or fail to improve.  Debroah Loop, PA-C  [facility] [address] [place],  [address] [phone]

## 2021-12-30 LAB — CULTURE, URINE COMPREHENSIVE

## 2021-12-31 ENCOUNTER — Other Ambulatory Visit: Payer: Self-pay | Admitting: *Deleted

## 2021-12-31 MED ORDER — CIPROFLOXACIN HCL 250 MG PO TABS: 250.0000 mg | ORAL_TABLET | Freq: Two times a day (BID) | ORAL | 0 refills | Status: AC

## 2022-01-15 DIAGNOSIS — E10649 Type 1 diabetes mellitus with hypoglycemia without coma: Secondary | ICD-10-CM | POA: Diagnosis not present

## 2022-01-28 DIAGNOSIS — E785 Hyperlipidemia, unspecified: Secondary | ICD-10-CM | POA: Diagnosis not present

## 2022-01-28 DIAGNOSIS — N182 Chronic kidney disease, stage 2 (mild): Secondary | ICD-10-CM | POA: Diagnosis not present

## 2022-01-28 DIAGNOSIS — E1069 Type 1 diabetes mellitus with other specified complication: Secondary | ICD-10-CM | POA: Diagnosis not present

## 2022-01-28 DIAGNOSIS — E1022 Type 1 diabetes mellitus with diabetic chronic kidney disease: Secondary | ICD-10-CM | POA: Diagnosis not present

## 2022-01-28 DIAGNOSIS — I10 Essential (primary) hypertension: Secondary | ICD-10-CM | POA: Diagnosis not present

## 2022-02-04 DIAGNOSIS — E785 Hyperlipidemia, unspecified: Secondary | ICD-10-CM | POA: Diagnosis not present

## 2022-02-04 DIAGNOSIS — E1022 Type 1 diabetes mellitus with diabetic chronic kidney disease: Secondary | ICD-10-CM | POA: Diagnosis not present

## 2022-02-04 DIAGNOSIS — E1069 Type 1 diabetes mellitus with other specified complication: Secondary | ICD-10-CM | POA: Diagnosis not present

## 2022-02-04 DIAGNOSIS — Z23 Encounter for immunization: Secondary | ICD-10-CM | POA: Diagnosis not present

## 2022-02-04 DIAGNOSIS — I10 Essential (primary) hypertension: Secondary | ICD-10-CM | POA: Diagnosis not present

## 2022-02-04 DIAGNOSIS — N182 Chronic kidney disease, stage 2 (mild): Secondary | ICD-10-CM | POA: Diagnosis not present

## 2022-02-25 ENCOUNTER — Ambulatory Visit: Payer: PPO | Admitting: Dermatology

## 2022-02-25 DIAGNOSIS — D229 Melanocytic nevi, unspecified: Secondary | ICD-10-CM | POA: Diagnosis not present

## 2022-02-25 DIAGNOSIS — Z85828 Personal history of other malignant neoplasm of skin: Secondary | ICD-10-CM

## 2022-02-25 DIAGNOSIS — Z1283 Encounter for screening for malignant neoplasm of skin: Secondary | ICD-10-CM

## 2022-02-25 DIAGNOSIS — L821 Other seborrheic keratosis: Secondary | ICD-10-CM

## 2022-02-25 DIAGNOSIS — L578 Other skin changes due to chronic exposure to nonionizing radiation: Secondary | ICD-10-CM

## 2022-02-25 DIAGNOSIS — Z853 Personal history of malignant neoplasm of breast: Secondary | ICD-10-CM

## 2022-02-25 DIAGNOSIS — L814 Other melanin hyperpigmentation: Secondary | ICD-10-CM | POA: Diagnosis not present

## 2022-02-25 DIAGNOSIS — Z86018 Personal history of other benign neoplasm: Secondary | ICD-10-CM

## 2022-02-25 NOTE — Patient Instructions (Signed)
Due to recent changes in healthcare laws, you may see results of your pathology and/or laboratory studies on MyChart before the doctors have had a chance to review them. We understand that in some cases there may be results that are confusing or concerning to you. Please understand that not all results are received at the same time and often the doctors may need to interpret multiple results in order to provide you with the best plan of care or course of treatment. Therefore, we ask that you please give us 2 business days to thoroughly review all your results before contacting the office for clarification. Should we see a critical lab result, you will be contacted sooner.   If You Need Anything After Your Visit  If you have any questions or concerns for your doctor, please call our main line at [phone] and press option 4 to reach your doctor's medical assistant. If no one answers, please leave a voicemail as directed and we will return your call as soon as possible. Messages left after 4 pm will be answered the following business day.   You may also send us a message via MyChart. We typically respond to MyChart messages within 1-2 business days.  For prescription refills, please ask your pharmacy to contact our office. Our fax number is [phone].  If you have an urgent issue when the clinic is closed that cannot wait until the next business day, you can page your doctor at the number below.    Please note that while we do our best to be available for urgent issues outside of office hours, we are not available 24/7.   If you have an urgent issue and are unable to reach us, you may choose to seek medical care at your doctor's office, retail clinic, urgent care center, or emergency room.  If you have a medical emergency, please immediately call 911 or go to the emergency department.  Pager Numbers  - Dr. Kowalski: [phone]  - Dr. Moye: [phone]  - Dr. Stewart:  [phone]  In the event of inclement weather, please call our main line at [phone] for an update on the status of any delays or closures.  Dermatology Medication Tips: Please keep the boxes that topical medications come in in order to help keep track of the instructions about where and how to use these. Pharmacies typically print the medication instructions only on the boxes and not directly on the medication tubes.   If your medication is too expensive, please contact our office at [phone] option 4 or send us a message through MyChart.   We are unable to tell what your co-pay for medications will be in advance as this is different depending on your insurance coverage. However, we may be able to find a substitute medication at lower cost or fill out paperwork to get insurance to cover a needed medication.   If a prior authorization is required to get your medication covered by your insurance company, please allow us 1-2 business days to complete this process.  Drug prices often vary depending on where the prescription is filled and some pharmacies may offer cheaper prices.  The website www.goodrx.com contains coupons for medications through different pharmacies. The prices here do not account for what the cost may be with help from insurance (it may be cheaper with your insurance), but the website can give you the price if you did not use any insurance.  - You can print the associated coupon and take it with   your prescription to the pharmacy.  - You may also stop by our office during regular business hours and pick up a GoodRx coupon card.  - If you need your prescription sent electronically to a different pharmacy, notify our office through [facility] MyChart or by phone at [phone] option 4.     Si Usted Necesita Algo Despus de Su Visita  Tambin puede enviarnos un mensaje a travs de MyChart. Por lo general respondemos a los mensajes de MyChart en el transcurso de 1 a 2  das hbiles.  Para renovar recetas, por favor pida a su farmacia que se ponga en contacto con nuestra oficina. Nuestro nmero de fax es el [phone].  Si tiene un asunto urgente cuando la clnica est cerrada y que no puede esperar hasta el siguiente da hbil, puede llamar/localizar a su doctor(a) al nmero que aparece a continuacin.   Por favor, tenga en cuenta que aunque hacemos todo lo posible para estar disponibles para asuntos urgentes fuera del horario de oficina, no estamos disponibles las 24 horas del da, los 7 das de la semana.   Si tiene un problema urgente y no puede comunicarse con nosotros, puede optar por buscar atencin mdica  en el consultorio de su doctor(a), en una clnica privada, en un centro de atencin urgente o en una sala de emergencias.  Si tiene una emergencia mdica, por favor llame inmediatamente al 911 o vaya a la sala de emergencias.  Nmeros de bper  - Dr. Kowalski: [phone]  - Dra. Moye: [phone]  - Dra. Stewart: [phone]  En caso de inclemencias del tiempo, por favor llame a nuestra lnea principal al [phone] para una actualizacin sobre el estado de cualquier retraso o cierre.  Consejos para la medicacin en dermatologa: Por favor, guarde las cajas en las que vienen los medicamentos de uso tpico para ayudarle a seguir las instrucciones sobre dnde y cmo usarlos. Las farmacias generalmente imprimen las instrucciones del medicamento slo en las cajas y no directamente en los tubos del medicamento.   Si su medicamento es muy caro, por favor, pngase en contacto con nuestra oficina llamando al [phone] y presione la opcin 4 o envenos un mensaje a travs de MyChart.   No podemos decirle cul ser su copago por los medicamentos por adelantado ya que esto es diferente dependiendo de la cobertura de su seguro. Sin embargo, es posible que podamos encontrar un medicamento sustituto a menor costo o llenar un formulario para que el  seguro cubra el medicamento que se considera necesario.   Si se requiere una autorizacin previa para que su compaa de seguros cubra su medicamento, por favor permtanos de 1 a 2 das hbiles para completar este proceso.  Los precios de los medicamentos varan con frecuencia dependiendo del lugar de dnde se surte la receta y alguna farmacias pueden ofrecer precios ms baratos.  El sitio web www.goodrx.com tiene cupones para medicamentos de diferentes farmacias. Los precios aqu no tienen en cuenta lo que podra costar con la ayuda del seguro (puede ser ms barato con su seguro), pero el sitio web puede darle el precio si no utiliz ningn seguro.  - Puede imprimir el cupn correspondiente y llevarlo con su receta a la farmacia.  - Tambin puede pasar por nuestra oficina durante el horario de atencin regular y recoger una tarjeta de cupones de GoodRx.  - Si necesita que su receta se enve electrnicamente a una farmacia diferente, informe a nuestra oficina a travs de MyChart de    o por telfono llamando al [phone] y presione la opcin 4.  

## 2022-02-25 NOTE — Progress Notes (Signed)
   Follow-Up Visit   Subjective  Alejandra Lowe is a 76 y.o. female who presents for the following: Annual Exam (History of BCC and Dysplastic nevus - The patient presents for Total-Body Skin Exam (TBSE) for skin cancer screening and mole check.  The patient has spots, moles and lesions to be evaluated, some may be new or changing and the patient has concerns that these could be cancer./).  The following portions of the chart were reviewed this encounter and updated as appropriate:   Tobacco  Allergies  Meds  Problems  Med Hx  Surg Hx  Fam Hx     Review of Systems:  No other skin or systemic complaints except as noted in HPI or Assessment and Plan.  Objective  Well appearing patient in no apparent distress; mood and affect are within normal limits.  A full examination was performed including scalp, head, eyes, ears, nose, lips, neck, chest, axillae, abdomen, back, buttocks, bilateral upper extremities, bilateral lower extremities, hands, feet, fingers, toes, fingernails, and toenails. All findings within normal limits unless otherwise noted below.  Left Breast No lymphadenopathy   Assessment & Plan   History of Basal Cell Carcinoma of the Skin - No evidence of recurrence today - Recommend regular full body skin exams - Recommend daily broad spectrum sunscreen SPF 30+ to sun-exposed areas, reapply every 2 hours as needed.  - Call if any new or changing lesions are noted between office visits  History of Dysplastic Nevi - No evidence of recurrence today - Recommend regular full body skin exams - Recommend daily broad spectrum sunscreen SPF 30+ to sun-exposed areas, reapply every 2 hours as needed.  - Call if any new or changing lesions are noted between office visits  Lentigines - Scattered tan macules - Due to sun exposure - Benign-appearing, observe - Recommend daily broad spectrum sunscreen SPF 30+ to sun-exposed areas, reapply every 2 hours as needed. - Call for any  changes  Seborrheic Keratoses - Stuck-on, waxy, tan-brown papules and/or plaques  - Benign-appearing - Discussed benign etiology and prognosis. - Observe - Call for any changes  Melanocytic Nevi - Tan-brown and/or pink-flesh-colored symmetric macules and papules - Benign appearing on exam today - Observation - Call clinic for new or changing moles - Recommend daily use of broad spectrum spf 30+ sunscreen to sun-exposed areas.   Hemangiomas - Red papules - Discussed benign nature - Observe - Call for any changes  Actinic Damage - Chronic condition, secondary to cumulative UV/sun exposure - diffuse scaly erythematous macules with underlying dyspigmentation - Recommend daily broad spectrum sunscreen SPF 30+ to sun-exposed areas, reapply every 2 hours as needed.  - Staying in the shade or wearing long sleeves, sun glasses (UVA+UVB protection) and wide brim hats (4-inch brim around the entire circumference of the hat) are also recommended for sun protection.  - Call for new or changing lesions.  Skin cancer screening performed today.  History of breast cancer Left Breast No lymphadenopathy. Clear. Observe for recurrence. Call clinic for new or changing lesions.  Recommend regular skin exams, daily broad-spectrum spf 30+ sunscreen use, and photoprotection.    Return in about 1 year (around 02/26/2023) for TBSE.  I, Ashok Cordia, CMA, am acting as scribe for Sarina Ser, MD . Documentation: I have reviewed the above documentation for accuracy and completeness, and I agree with the above.  Sarina Ser, MD

## 2022-03-10 ENCOUNTER — Encounter: Payer: Self-pay | Admitting: Dermatology

## 2022-03-17 DIAGNOSIS — E785 Hyperlipidemia, unspecified: Secondary | ICD-10-CM | POA: Diagnosis not present

## 2022-03-17 DIAGNOSIS — E1069 Type 1 diabetes mellitus with other specified complication: Secondary | ICD-10-CM | POA: Diagnosis not present

## 2022-03-17 DIAGNOSIS — E10649 Type 1 diabetes mellitus with hypoglycemia without coma: Secondary | ICD-10-CM | POA: Diagnosis not present

## 2022-04-15 DIAGNOSIS — E10649 Type 1 diabetes mellitus with hypoglycemia without coma: Secondary | ICD-10-CM | POA: Diagnosis not present

## 2022-07-15 DIAGNOSIS — E10649 Type 1 diabetes mellitus with hypoglycemia without coma: Secondary | ICD-10-CM | POA: Diagnosis not present

## 2022-07-29 DIAGNOSIS — E1069 Type 1 diabetes mellitus with other specified complication: Secondary | ICD-10-CM | POA: Diagnosis not present

## 2022-07-29 DIAGNOSIS — E785 Hyperlipidemia, unspecified: Secondary | ICD-10-CM | POA: Diagnosis not present

## 2022-07-29 DIAGNOSIS — E10649 Type 1 diabetes mellitus with hypoglycemia without coma: Secondary | ICD-10-CM | POA: Diagnosis not present

## 2022-08-03 DIAGNOSIS — E1022 Type 1 diabetes mellitus with diabetic chronic kidney disease: Secondary | ICD-10-CM | POA: Diagnosis not present

## 2022-08-03 DIAGNOSIS — E785 Hyperlipidemia, unspecified: Secondary | ICD-10-CM | POA: Diagnosis not present

## 2022-08-03 DIAGNOSIS — E1069 Type 1 diabetes mellitus with other specified complication: Secondary | ICD-10-CM | POA: Diagnosis not present

## 2022-08-03 DIAGNOSIS — I10 Essential (primary) hypertension: Secondary | ICD-10-CM | POA: Diagnosis not present

## 2022-08-03 DIAGNOSIS — N182 Chronic kidney disease, stage 2 (mild): Secondary | ICD-10-CM | POA: Diagnosis not present

## 2022-08-10 DIAGNOSIS — E1022 Type 1 diabetes mellitus with diabetic chronic kidney disease: Secondary | ICD-10-CM | POA: Diagnosis not present

## 2022-08-10 DIAGNOSIS — Z0000 Encounter for general adult medical examination without abnormal findings: Secondary | ICD-10-CM | POA: Diagnosis not present

## 2022-08-10 DIAGNOSIS — I10 Essential (primary) hypertension: Secondary | ICD-10-CM | POA: Diagnosis not present

## 2022-08-10 DIAGNOSIS — E785 Hyperlipidemia, unspecified: Secondary | ICD-10-CM | POA: Diagnosis not present

## 2022-08-10 DIAGNOSIS — N182 Chronic kidney disease, stage 2 (mild): Secondary | ICD-10-CM | POA: Diagnosis not present

## 2022-08-10 DIAGNOSIS — E1069 Type 1 diabetes mellitus with other specified complication: Secondary | ICD-10-CM | POA: Diagnosis not present

## 2022-10-30 DIAGNOSIS — E10649 Type 1 diabetes mellitus with hypoglycemia without coma: Secondary | ICD-10-CM | POA: Diagnosis not present

## 2022-12-02 DIAGNOSIS — E1069 Type 1 diabetes mellitus with other specified complication: Secondary | ICD-10-CM | POA: Diagnosis not present

## 2022-12-02 DIAGNOSIS — E10649 Type 1 diabetes mellitus with hypoglycemia without coma: Secondary | ICD-10-CM | POA: Diagnosis not present

## 2022-12-02 DIAGNOSIS — E785 Hyperlipidemia, unspecified: Secondary | ICD-10-CM | POA: Diagnosis not present

## 2022-12-25 ENCOUNTER — Other Ambulatory Visit: Payer: Self-pay | Admitting: Physician Assistant

## 2022-12-25 DIAGNOSIS — N390 Urinary tract infection, site not specified: Secondary | ICD-10-CM

## 2023-02-10 DIAGNOSIS — E785 Hyperlipidemia, unspecified: Secondary | ICD-10-CM | POA: Diagnosis not present

## 2023-02-10 DIAGNOSIS — Z2821 Immunization not carried out because of patient refusal: Secondary | ICD-10-CM | POA: Diagnosis not present

## 2023-02-10 DIAGNOSIS — E10649 Type 1 diabetes mellitus with hypoglycemia without coma: Secondary | ICD-10-CM | POA: Diagnosis not present

## 2023-02-10 DIAGNOSIS — E1022 Type 1 diabetes mellitus with diabetic chronic kidney disease: Secondary | ICD-10-CM | POA: Diagnosis not present

## 2023-02-10 DIAGNOSIS — N182 Chronic kidney disease, stage 2 (mild): Secondary | ICD-10-CM | POA: Diagnosis not present

## 2023-02-10 DIAGNOSIS — I10 Essential (primary) hypertension: Secondary | ICD-10-CM | POA: Diagnosis not present

## 2023-02-10 DIAGNOSIS — E1069 Type 1 diabetes mellitus with other specified complication: Secondary | ICD-10-CM | POA: Diagnosis not present

## 2023-02-15 DIAGNOSIS — E10649 Type 1 diabetes mellitus with hypoglycemia without coma: Secondary | ICD-10-CM | POA: Diagnosis not present

## 2023-04-21 DIAGNOSIS — E10649 Type 1 diabetes mellitus with hypoglycemia without coma: Secondary | ICD-10-CM | POA: Diagnosis not present

## 2023-04-21 DIAGNOSIS — E1069 Type 1 diabetes mellitus with other specified complication: Secondary | ICD-10-CM | POA: Diagnosis not present

## 2023-04-21 DIAGNOSIS — E785 Hyperlipidemia, unspecified: Secondary | ICD-10-CM | POA: Diagnosis not present

## 2023-04-21 DIAGNOSIS — I10 Essential (primary) hypertension: Secondary | ICD-10-CM | POA: Diagnosis not present

## 2023-05-04 ENCOUNTER — Emergency Department
Admission: EM | Admit: 2023-05-04 | Discharge: 2023-05-04 | Discharge status: Against Medical Advice | Payer: PPO | Attending: Emergency Medicine | Admitting: Emergency Medicine

## 2023-05-04 ENCOUNTER — Other Ambulatory Visit: Payer: Self-pay

## 2023-05-04 ENCOUNTER — Encounter: Payer: Self-pay | Admitting: Intensive Care

## 2023-05-04 DIAGNOSIS — Z5321 Procedure and treatment not carried out due to patient leaving prior to being seen by health care provider: Secondary | ICD-10-CM | POA: Diagnosis not present

## 2023-05-04 DIAGNOSIS — W010XXA Fall on same level from slipping, tripping and stumbling without subsequent striking against object, initial encounter: Secondary | ICD-10-CM | POA: Diagnosis not present

## 2023-05-04 DIAGNOSIS — R4182 Altered mental status, unspecified: Secondary | ICD-10-CM | POA: Diagnosis not present

## 2023-05-04 DIAGNOSIS — W19XXXA Unspecified fall, initial encounter: Secondary | ICD-10-CM | POA: Diagnosis not present

## 2023-05-04 DIAGNOSIS — R42 Dizziness and giddiness: Secondary | ICD-10-CM | POA: Diagnosis not present

## 2023-05-04 DIAGNOSIS — Y92009 Unspecified place in unspecified non-institutional (private) residence as the place of occurrence of the external cause: Secondary | ICD-10-CM | POA: Diagnosis not present

## 2023-05-04 DIAGNOSIS — I441 Atrioventricular block, second degree: Secondary | ICD-10-CM | POA: Diagnosis not present

## 2023-05-04 DIAGNOSIS — R001 Bradycardia, unspecified: Secondary | ICD-10-CM | POA: Diagnosis not present

## 2023-05-04 LAB — BASIC METABOLIC PANEL
Anion gap: 9 (ref 5–15)
BUN: 32 mg/dL — ABNORMAL HIGH (ref 8–23)
CO2: 25 mmol/L (ref 22–32)
Calcium: 9.1 mg/dL (ref 8.9–10.3)
Chloride: 101 mmol/L (ref 98–111)
Creatinine, Ser: 0.92 mg/dL (ref 0.44–1.00)
GFR, Estimated: >60 mL/min (ref >60)
Glucose, Bld: 217 mg/dL — ABNORMAL HIGH (ref 70–99)
Potassium: 4.2 mmol/L (ref 3.5–5.1)
Sodium: 135 mmol/L (ref 135–145)

## 2023-05-04 LAB — CBC
HCT: 40.2 % (ref 36.0–46.0)
Hemoglobin: 13.4 g/dL (ref 12.0–15.0)
MCH: 32.6 pg (ref 26.0–34.0)
MCHC: 33.3 g/dL (ref 30.0–36.0)
MCV: 97.8 fL (ref 80.0–100.0)
Platelets: 231 10*3/uL (ref 150–400)
RBC: 4.11 MIL/uL (ref 3.87–5.11)
RDW: 12.6 % (ref 11.5–15.5)
WBC: 5.5 10*3/uL (ref 4.0–10.5)
nRBC: 0 % (ref 0.0–0.2)

## 2023-05-04 LAB — URINALYSIS, ROUTINE W REFLEX MICROSCOPIC
Bilirubin Urine: NEGATIVE
Glucose, UA: NEGATIVE mg/dL
Hgb urine dipstick: NEGATIVE
Ketones, ur: 5 mg/dL — AB
Leukocytes,Ua: NEGATIVE
Nitrite: NEGATIVE
Protein, ur: NEGATIVE mg/dL
Specific Gravity, Urine: 1.026 (ref 1.005–1.030)
pH: 5 (ref 5.0–8.0)

## 2023-05-04 NOTE — ED Notes (Signed)
Patient to First Nurse Desk. Pt complaining by wait time. Patient explained ED process. Pt declined to stay. NAD noted and ambulatory with steady gait out of ED.

## 2023-05-04 NOTE — ED Notes (Signed)
First Nurse Note: Patient to ED from [facility] for a fall/possible seizure this AM. Per KC, patient does not remember incident but husband reports convulsions. While at [facility]- EKG obtained showing a type 2 block at 40.

## 2023-05-04 NOTE — ED Triage Notes (Addendum)
Patient presents POV with husband who said she has been dizzy and lightheaded the past week. Also reports some unsteadiness. Brought over from [facility].   Husband reports he heard patient fall and he went into bedroom and found her on the floor. Reports she was upset and crying out shaking. Episode lasted about 1-2 minutes and then started talking to him immediately after. Reports she has had trouble ambulating the past week.   Patient alert to self, place, and situation. Disoriented to time

## 2023-05-12 DIAGNOSIS — R55 Syncope and collapse: Secondary | ICD-10-CM | POA: Diagnosis not present

## 2023-05-12 DIAGNOSIS — I10 Essential (primary) hypertension: Secondary | ICD-10-CM | POA: Diagnosis not present

## 2023-05-12 DIAGNOSIS — R4189 Other symptoms and signs involving cognitive functions and awareness: Secondary | ICD-10-CM | POA: Diagnosis not present

## 2023-05-19 DIAGNOSIS — R55 Syncope and collapse: Secondary | ICD-10-CM | POA: Diagnosis not present

## 2023-06-09 DIAGNOSIS — E10649 Type 1 diabetes mellitus with hypoglycemia without coma: Secondary | ICD-10-CM | POA: Diagnosis not present

## 2023-06-23 DIAGNOSIS — R4189 Other symptoms and signs involving cognitive functions and awareness: Secondary | ICD-10-CM | POA: Diagnosis not present

## 2023-06-23 DIAGNOSIS — R55 Syncope and collapse: Secondary | ICD-10-CM | POA: Diagnosis not present

## 2023-06-23 DIAGNOSIS — I10 Essential (primary) hypertension: Secondary | ICD-10-CM | POA: Diagnosis not present

## 2023-06-28 DIAGNOSIS — R296 Repeated falls: Secondary | ICD-10-CM | POA: Diagnosis not present

## 2023-06-28 DIAGNOSIS — R519 Headache, unspecified: Secondary | ICD-10-CM | POA: Diagnosis not present

## 2023-06-28 DIAGNOSIS — R2689 Other abnormalities of gait and mobility: Secondary | ICD-10-CM | POA: Diagnosis not present

## 2023-06-28 DIAGNOSIS — R569 Unspecified convulsions: Secondary | ICD-10-CM | POA: Diagnosis not present

## 2023-06-28 DIAGNOSIS — R55 Syncope and collapse: Secondary | ICD-10-CM | POA: Diagnosis not present

## 2023-06-28 DIAGNOSIS — R42 Dizziness and giddiness: Secondary | ICD-10-CM | POA: Diagnosis not present

## 2023-06-28 DIAGNOSIS — Z711 Person with feared health complaint in whom no diagnosis is made: Secondary | ICD-10-CM | POA: Diagnosis not present

## 2023-07-06 DIAGNOSIS — E103313 Type 1 diabetes mellitus with moderate nonproliferative diabetic retinopathy with macular edema, bilateral: Secondary | ICD-10-CM | POA: Diagnosis not present

## 2023-07-27 DIAGNOSIS — E103311 Type 1 diabetes mellitus with moderate nonproliferative diabetic retinopathy with macular edema, right eye: Secondary | ICD-10-CM | POA: Diagnosis not present

## 2023-07-27 DIAGNOSIS — E103392 Type 1 diabetes mellitus with moderate nonproliferative diabetic retinopathy without macular edema, left eye: Secondary | ICD-10-CM | POA: Diagnosis not present

## 2023-08-19 DIAGNOSIS — E10649 Type 1 diabetes mellitus with hypoglycemia without coma: Secondary | ICD-10-CM | POA: Diagnosis not present

## 2023-08-19 DIAGNOSIS — F0390 Unspecified dementia without behavioral disturbance: Secondary | ICD-10-CM | POA: Diagnosis not present

## 2023-08-19 DIAGNOSIS — Z0000 Encounter for general adult medical examination without abnormal findings: Secondary | ICD-10-CM | POA: Diagnosis not present

## 2023-08-19 DIAGNOSIS — E103211 Type 1 diabetes mellitus with mild nonproliferative diabetic retinopathy with macular edema, right eye: Secondary | ICD-10-CM | POA: Diagnosis not present

## 2023-08-19 DIAGNOSIS — I10 Essential (primary) hypertension: Secondary | ICD-10-CM | POA: Diagnosis not present

## 2023-08-19 DIAGNOSIS — E1022 Type 1 diabetes mellitus with diabetic chronic kidney disease: Secondary | ICD-10-CM | POA: Diagnosis not present

## 2023-08-19 DIAGNOSIS — N182 Chronic kidney disease, stage 2 (mild): Secondary | ICD-10-CM | POA: Diagnosis not present

## 2023-08-25 DIAGNOSIS — I10 Essential (primary) hypertension: Secondary | ICD-10-CM | POA: Diagnosis not present

## 2023-08-25 DIAGNOSIS — E1022 Type 1 diabetes mellitus with diabetic chronic kidney disease: Secondary | ICD-10-CM | POA: Diagnosis not present

## 2023-08-25 DIAGNOSIS — N182 Chronic kidney disease, stage 2 (mild): Secondary | ICD-10-CM | POA: Diagnosis not present

## 2023-08-25 DIAGNOSIS — E1069 Type 1 diabetes mellitus with other specified complication: Secondary | ICD-10-CM | POA: Diagnosis not present

## 2023-08-25 DIAGNOSIS — E10649 Type 1 diabetes mellitus with hypoglycemia without coma: Secondary | ICD-10-CM | POA: Diagnosis not present

## 2023-08-25 DIAGNOSIS — E785 Hyperlipidemia, unspecified: Secondary | ICD-10-CM | POA: Diagnosis not present

## 2023-08-26 ENCOUNTER — Inpatient Hospital Stay
Admission: EM | Admit: 2023-08-26 | Discharge: 2023-08-27 | DRG: 261 | Disposition: A | Discharge status: Other Acute Inpatient Hospital | Attending: Internal Medicine | Admitting: Internal Medicine

## 2023-08-26 ENCOUNTER — Encounter (HOSPITAL_COMMUNITY): Payer: Self-pay

## 2023-08-26 ENCOUNTER — Inpatient Hospital Stay

## 2023-08-26 ENCOUNTER — Other Ambulatory Visit: Payer: Self-pay

## 2023-08-26 ENCOUNTER — Encounter
Admission: EM | Disposition: A | Discharge status: Other Acute Inpatient Hospital | Payer: Self-pay | Source: Home / Self Care | Attending: Internal Medicine

## 2023-08-26 ENCOUNTER — Emergency Department

## 2023-08-26 DIAGNOSIS — W07XXXA Fall from chair, initial encounter: Secondary | ICD-10-CM | POA: Diagnosis present

## 2023-08-26 DIAGNOSIS — Z9221 Personal history of antineoplastic chemotherapy: Secondary | ICD-10-CM

## 2023-08-26 DIAGNOSIS — F0390 Unspecified dementia without behavioral disturbance: Secondary | ICD-10-CM | POA: Diagnosis present

## 2023-08-26 DIAGNOSIS — Z9641 Presence of insulin pump (external) (internal): Secondary | ICD-10-CM | POA: Diagnosis not present

## 2023-08-26 DIAGNOSIS — R55 Syncope and collapse: Secondary | ICD-10-CM | POA: Diagnosis not present

## 2023-08-26 DIAGNOSIS — S80812A Abrasion, left lower leg, initial encounter: Secondary | ICD-10-CM | POA: Diagnosis not present

## 2023-08-26 DIAGNOSIS — I2489 Other forms of acute ischemic heart disease: Secondary | ICD-10-CM | POA: Diagnosis not present

## 2023-08-26 DIAGNOSIS — Z803 Family history of malignant neoplasm of breast: Secondary | ICD-10-CM | POA: Diagnosis not present

## 2023-08-26 DIAGNOSIS — I10 Essential (primary) hypertension: Secondary | ICD-10-CM | POA: Diagnosis present

## 2023-08-26 DIAGNOSIS — I459 Conduction disorder, unspecified: Secondary | ICD-10-CM | POA: Diagnosis not present

## 2023-08-26 DIAGNOSIS — E109 Type 1 diabetes mellitus without complications: Secondary | ICD-10-CM | POA: Diagnosis not present

## 2023-08-26 DIAGNOSIS — W19XXXA Unspecified fall, initial encounter: Secondary | ICD-10-CM

## 2023-08-26 DIAGNOSIS — I442 Atrioventricular block, complete: Secondary | ICD-10-CM | POA: Diagnosis not present

## 2023-08-26 DIAGNOSIS — R001 Bradycardia, unspecified: Secondary | ICD-10-CM | POA: Diagnosis not present

## 2023-08-26 DIAGNOSIS — E785 Hyperlipidemia, unspecified: Secondary | ICD-10-CM | POA: Diagnosis not present

## 2023-08-26 DIAGNOSIS — Z79899 Other long term (current) drug therapy: Secondary | ICD-10-CM

## 2023-08-26 DIAGNOSIS — S0101XA Laceration without foreign body of scalp, initial encounter: Secondary | ICD-10-CM | POA: Diagnosis not present

## 2023-08-26 DIAGNOSIS — Z85828 Personal history of other malignant neoplasm of skin: Secondary | ICD-10-CM

## 2023-08-26 DIAGNOSIS — R9082 White matter disease, unspecified: Secondary | ICD-10-CM | POA: Diagnosis not present

## 2023-08-26 DIAGNOSIS — Z853 Personal history of malignant neoplasm of breast: Secondary | ICD-10-CM

## 2023-08-26 DIAGNOSIS — Z923 Personal history of irradiation: Secondary | ICD-10-CM

## 2023-08-26 DIAGNOSIS — Z23 Encounter for immunization: Secondary | ICD-10-CM | POA: Diagnosis not present

## 2023-08-26 DIAGNOSIS — Y92000 Kitchen of unspecified non-institutional (private) residence as  the place of occurrence of the external cause: Secondary | ICD-10-CM | POA: Diagnosis not present

## 2023-08-26 DIAGNOSIS — R42 Dizziness and giddiness: Secondary | ICD-10-CM | POA: Diagnosis not present

## 2023-08-26 DIAGNOSIS — Z794 Long term (current) use of insulin: Secondary | ICD-10-CM | POA: Diagnosis not present

## 2023-08-26 HISTORY — PX: TEMPORARY PACEMAKER: CATH118268

## 2023-08-26 LAB — CBC WITH DIFFERENTIAL/PLATELET
Abs Immature Granulocytes: 0.04 10*3/uL (ref 0.00–0.07)
Basophils Absolute: 0.1 10*3/uL (ref 0.0–0.1)
Basophils Relative: 1 %
Eosinophils Absolute: 0 10*3/uL (ref 0.0–0.5)
Eosinophils Relative: 0 %
HCT: 36.5 % (ref 36.0–46.0)
Hemoglobin: 12 g/dL (ref 12.0–15.0)
Immature Granulocytes: 0 %
Lymphocytes Relative: 9 %
Lymphs Abs: 1 10*3/uL (ref 0.7–4.0)
MCH: 32.2 pg (ref 26.0–34.0)
MCHC: 32.9 g/dL (ref 30.0–36.0)
MCV: 97.9 fL (ref 80.0–100.0)
Monocytes Absolute: 0.7 10*3/uL (ref 0.1–1.0)
Monocytes Relative: 7 %
Neutro Abs: 9.1 10*3/uL — ABNORMAL HIGH (ref 1.7–7.7)
Neutrophils Relative %: 83 %
Platelets: 213 10*3/uL (ref 150–400)
RBC: 3.73 MIL/uL — ABNORMAL LOW (ref 3.87–5.11)
RDW: 12.7 % (ref 11.5–15.5)
WBC: 10.9 10*3/uL — ABNORMAL HIGH (ref 4.0–10.5)
nRBC: 0 % (ref 0.0–0.2)

## 2023-08-26 LAB — GLUCOSE, CAPILLARY
Glucose-Capillary: 267 mg/dL — ABNORMAL HIGH (ref 70–99)
Glucose-Capillary: 311 mg/dL — ABNORMAL HIGH (ref 70–99)
Glucose-Capillary: 309 mg/dL — ABNORMAL HIGH (ref 70–99)

## 2023-08-26 LAB — COMPREHENSIVE METABOLIC PANEL WITH GFR
ALT: 21 U/L (ref 0–44)
AST: 26 U/L (ref 15–41)
Albumin: 3.9 g/dL (ref 3.5–5.0)
Alkaline Phosphatase: 48 U/L (ref 38–126)
Anion gap: 13 (ref 5–15)
BUN: 31 mg/dL — ABNORMAL HIGH (ref 8–23)
CO2: 22 mmol/L (ref 22–32)
Calcium: 9.1 mg/dL (ref 8.9–10.3)
Chloride: 100 mmol/L (ref 98–111)
Creatinine, Ser: 1.32 mg/dL — ABNORMAL HIGH (ref 0.44–1.00)
GFR, Estimated: 42 mL/min — ABNORMAL LOW (ref >60)
Glucose, Bld: 294 mg/dL — ABNORMAL HIGH (ref 70–99)
Potassium: 4.8 mmol/L (ref 3.5–5.1)
Sodium: 135 mmol/L (ref 135–145)
Total Bilirubin: 1.1 mg/dL (ref 0.0–1.2)
Total Protein: 6.6 g/dL (ref 6.5–8.1)

## 2023-08-26 LAB — TSH: TSH: 1.006 u[IU]/mL (ref 0.350–4.500)

## 2023-08-26 LAB — TROPONIN I (HIGH SENSITIVITY)
Troponin I (High Sensitivity): 25 ng/L — ABNORMAL HIGH (ref <18)
Troponin I (High Sensitivity): 45 ng/L — ABNORMAL HIGH (ref <18)

## 2023-08-26 LAB — HEMOGLOBIN A1C
Hgb A1c MFr Bld: 5.4 % (ref 4.8–5.6)
Mean Plasma Glucose: 108.28 mg/dL

## 2023-08-26 LAB — MRSA NEXT GEN BY PCR, NASAL: MRSA by PCR Next Gen: NOT DETECTED

## 2023-08-26 LAB — MAGNESIUM: Magnesium: 2.3 mg/dL (ref 1.7–2.4)

## 2023-08-26 LAB — T4, FREE: Free T4: 1.04 ng/dL (ref 0.61–1.12)

## 2023-08-26 LAB — BRAIN NATRIURETIC PEPTIDE: B Natriuretic Peptide: 710.2 pg/mL — ABNORMAL HIGH (ref 0.0–100.0)

## 2023-08-26 LAB — PHOSPHORUS: Phosphorus: 3.9 mg/dL (ref 2.5–4.6)

## 2023-08-26 SURGERY — TEMPORARY PACEMAKER
Anesthesia: LOCAL

## 2023-08-26 MED ORDER — INSULIN ASPART 100 UNIT/ML IJ SOLN: 0.0000 [IU] | Freq: Every day | INTRAMUSCULAR | Status: DC

## 2023-08-26 MED ORDER — INSULIN ASPART 100 UNIT/ML IJ SOLN: 0.0000 [IU] | Freq: Three times a day (TID) | INTRAMUSCULAR | Status: DC

## 2023-08-26 MED ORDER — OMEGA-3-ACID ETHYL ESTERS 1 G PO CAPS
1.0000 g | ORAL_CAPSULE | Freq: Every day | ORAL | Status: DC
  Administered 2023-08-27: 1 g via ORAL
  Filled 2023-08-26 (×2): qty 1

## 2023-08-26 MED ORDER — DOCUSATE SODIUM 100 MG PO CAPS: 100.0000 mg | ORAL_CAPSULE | Freq: Two times a day (BID) | ORAL | Status: DC | PRN

## 2023-08-26 MED ORDER — LIDOCAINE HCL 1 % IJ SOLN
INTRAMUSCULAR | Status: AC
Start: 2023-08-26 — End: ?
  Filled 2023-08-26: qty 20

## 2023-08-26 MED ORDER — POLYETHYLENE GLYCOL 3350 17 G PO PACK: 17.0000 g | PACK | Freq: Every day | ORAL | Status: DC | PRN

## 2023-08-26 MED ORDER — INSULIN ASPART 100 UNIT/ML IJ SOLN
0.0000 [IU] | INTRAMUSCULAR | Status: DC
Start: 2023-08-26 — End: 2023-12-12

## 2023-08-26 MED ORDER — LIDOCAINE HCL (PF) 1 % IJ SOLN
INTRAMUSCULAR | Status: DC | PRN
  Administered 2023-08-26: 2 mL

## 2023-08-26 MED ORDER — VITAMIN E 45 MG (100 UNIT) PO CAPS
400.0000 [IU] | ORAL_CAPSULE | Freq: Every day | ORAL | Status: DC
  Administered 2023-08-27: 400 [IU] via ORAL
  Filled 2023-08-26 (×2): qty 4

## 2023-08-26 MED ORDER — HEPARIN (PORCINE) IN NACL 1000-0.9 UT/500ML-% IV SOLN
INTRAVENOUS | Status: DC | PRN
  Administered 2023-08-26: 500 mL

## 2023-08-26 MED ORDER — INSULIN ASPART 100 UNIT/ML IJ SOLN
0.0000 [IU] | INTRAMUSCULAR | Status: DC
  Administered 2023-08-26: 7 [IU] via SUBCUTANEOUS
  Filled 2023-08-26: qty 1

## 2023-08-26 MED ORDER — INSULIN ASPART 100 UNIT/ML IJ SOLN
0.0000 [IU] | INTRAMUSCULAR | Status: DC
  Administered 2023-08-27: 3 [IU] via SUBCUTANEOUS
  Filled 2023-08-26: qty 1

## 2023-08-26 MED ORDER — ATORVASTATIN CALCIUM 10 MG PO TABS
10.0000 mg | ORAL_TABLET | Freq: Every day | ORAL | Status: DC
  Administered 2023-08-27: 10 mg via ORAL
  Filled 2023-08-26 (×2): qty 1

## 2023-08-26 MED ORDER — TETANUS-DIPHTH-ACELL PERTUSSIS 5-2.5-18.5 LF-MCG/0.5 IM SUSY
0.5000 mL | PREFILLED_SYRINGE | Freq: Once | INTRAMUSCULAR | Status: AC
  Administered 2023-08-26: 0.5 mL via INTRAMUSCULAR
  Filled 2023-08-26: qty 0.5

## 2023-08-26 MED ORDER — CHLORHEXIDINE GLUCONATE CLOTH 2 % EX PADS
6.0000 | MEDICATED_PAD | Freq: Every day | CUTANEOUS | Status: DC
  Administered 2023-08-26 – 2023-08-27 (×2): 6 via TOPICAL

## 2023-08-26 MED ORDER — INSULIN ASPART 100 UNIT/ML IJ SOLN
13.0000 [IU] | Freq: Once | INTRAMUSCULAR | Status: AC
  Administered 2023-08-26: 13 [IU] via SUBCUTANEOUS
  Filled 2023-08-26: qty 1

## 2023-08-26 SURGICAL SUPPLY — 7 items
CABLE ADAPT PACING TEMP 12FT (ADAPTER) IMPLANT
CATH S G BIP PACING (CATHETERS) IMPLANT
DRAPE BRACHIAL (DRAPES) IMPLANT
GLIDESHEATH SLEND SS 6F .021 (SHEATH) IMPLANT
KIT SYRINGE INJ CVI SPIKEX1 (MISCELLANEOUS) IMPLANT
PACK CARDIAC CATH (CUSTOM PROCEDURE TRAY) ×1 IMPLANT
SUT SILK 0 FSL (SUTURE) IMPLANT

## 2023-08-26 NOTE — Consult Note (Signed)
 Cardiology Consultation   Patient ID: Alejandra Lowe MRN: [id number]; DOB: Dec 19, 1945  Admit date: 08/26/2023 Date of Consult: 08/26/2023  PCP:  Jimmy Moulding, MD   [facility] Providers Cardiologist:  None        Patient Profile:   Alejandra Lowe is a 78 y.o. female with a hx of HTN, HLD, DM-1 and cognitive decline who is being seen 08/26/2023 for the evaluation of recent episodes of dizziness, syncope and collapse presented to [facility] ER after syncope and collapse.  The Patient Was Seen by [facility].  Were consulted by Dr. Braxton Calico to assist with temporary transvenous pacemaker placement  History of Present Illness:   Ms. Alejandra Lowe has noted several episodes of dizziness and at least near syncope if not true syncope over the last several weeks.  However she presented to [facility] ER after suffering a syncopal episode after getting up from her chiropractor's house.  She fell over and hit her head.  They brought her to the ER and she was found to be in complete heart block.  She was otherwise hemodynamic stable.  Urgent CT scan of the head performed to ensure no unstable bleed. Seen by [facility] Cardiology Team and we are asked to consult for assistance with temporary pacemaker placement.  Of note, EP is not available onsite until next week and therefore the patient will likely need to be transferred to [facility] ICU to be evaluated for permanent pacemaker placement.  She denies any chest pain pressure or dyspnea.  No PND, orthopnea or edema.  Past Medical History:  Diagnosis Date   Arthritis    Basal cell carcinoma 05/27/2020   R upper back - ED&C   Breast cancer (HCC) 1997   left breast, radiation, chemo   Diabetes mellitus without complication (HCC)    Dysplastic nevus 02/24/2021   Left distal ant thigh   Glaucoma    Hyperlipidemia    Hypertension    Personal history of chemotherapy 1997   BREAST CA   Personal history of radiation therapy 1997    BREAST CA   Seizure (HCC)     Past Surgical History:  Procedure Laterality Date   BREAST EXCISIONAL BIOPSY Left 1997   positive   BREAST LUMPECTOMY Left 1997   W/ radiation   ROTATOR CUFF REPAIR       Home Medications:  Prior to Admission medications   Medication Sig Start Date End Date Taking? Authorizing Provider  atorvastatin (LIPITOR) 10 MG tablet  11/01/17   [provider]  calcium carbonate (OS-CAL) 600 MG TABS tablet Take 600 mg by mouth daily with breakfast.    [provider]  conjugated estrogens  (PREMARIN ) vaginal cream Apply a pea-sized amount around the opening to the urethra 3 times weekly. 02/28/20   Vaillancourt, Samantha, PA-C  Continuous Blood Gluc Receiver (FREESTYLE LIBRE 14 DAY READER) DEVI Use 1 kit as directed E10.649 07/27/17   [provider]  Continuous Blood Gluc Sensor (FREESTYLE LIBRE 14 DAY SENSOR) MISC USE 1 EACH EVERY 14 (FOURTEEN) DAYS E10.649 12/08/17   [provider]  glucagon 1 MG injection Take as directed 07/27/17   [provider]  insulin  aspart (NOVOLOG) 100 UNIT/ML injection USE UP TO 40 UNITS PER DAY IN THE ACCUCHEK PUMP AS DIRECTED. 04/20/16   [provider]  nitrofurantoin , macrocrystal-monohydrate, (MACROBID ) 100 MG capsule Take 1 capsule (100 mg total) by mouth daily. 12/25/21   Vaillancourt, Samantha, PA-C  omega-3 acid ethyl esters (  LOVAZA) 1 g capsule Take 1 g by mouth daily.    [provider]  phenazopyridine  (PYRIDIUM ) 200 MG tablet Take 1 tablet (200 mg total) by mouth 3 (three) times daily as needed for pain. Do not take for longer than 2 days continuously. 12/25/21   Vaillancourt, Samantha, PA-C  vitamin E 400 UNIT capsule Take 400 Units by mouth daily.    [provider]    Inpatient Medications: Scheduled Meds:  Continuous Infusions:  PRN Meds:   Allergies:   No Known Allergies  Social History:   Social History   Socioeconomic History   Marital  status: Married    Spouse name: Not on file   Number of children: Not on file   Years of education: Not on file   Highest education level: Not on file  Occupational History   Not on file  Tobacco Use   Smoking status: Never   Smokeless tobacco: Never  Vaping Use   Vaping status: Never Used  Substance and Sexual Activity   Alcohol use: Yes    Alcohol/week: 7.0 standard drinks of alcohol    Types: 7 Glasses of wine per week   Drug use: Never   Sexual activity: Yes    Birth control/protection: Post-menopausal  Other Topics Concern   Not on file  Social History Narrative   Not on file   Social Drivers of Health   Financial Resource Strain: Low Risk  (08/19/2023)   Received from [facility]   Overall Financial Resource Strain (CARDIA)    Difficulty of Paying Living Expenses: Not hard at all  Food Insecurity: No Food Insecurity (08/19/2023)   Received from [facility]   Hunger Vital Sign    Worried About Running Out of Food in the Last Year: Never true    Ran Out of Food in the Last Year: Never true  Transportation Needs: No Transportation Needs (08/19/2023)   Received from [facility] - Transportation    In the past 12 months, has lack of transportation kept you from medical appointments or from getting medications?: No    Lack of Transportation (Non-Medical): No  Physical Activity: Not on file  Stress: Not on file  Social Connections: Not on file  Intimate Partner Violence: Not on file    Family History:    Family History  Problem Relation Age of Onset   Breast cancer Mother 24   Breast cancer Sister 27     ROS:  Please see the history of present illness.  Per HPI All other ROS reviewed and negative.     Physical Exam/Data:   Vitals:   08/26/23 1559 08/26/23 1628  BP:  (!) 120/40  Pulse: (!) 28   Resp: 18   Temp:  98.1 F (36.7 C)  TempSrc:  Oral  SpO2: 100%   Weight: 63.5 kg   Height: 5\' 7"   (1.702 m)    No intake or output data in the 24 hours ending 08/26/23 1701    08/26/2023    3:59 PM 05/04/2023    3:02 PM 12/25/2021    1:47 PM  Last 3 Weights  Weight (lbs) 140 lb 138 lb 136 lb 6.4 oz  Weight (kg) 63.504 kg 62.596 kg 61.871 kg     Body mass index is 21.93 kg/m.  General:  Well nourished, well developed, in no acute distress; HEENT: head laceration noted Neck: no JVD Vascular: No carotid bruits; Distal pulses 2+ bilaterally  Cardiac:  normal S1, S2; bradycardic with a regular rhythm;; no murmur rubs or gallops Lungs:  clear to auscultation bilaterally, no wheezing, rhonchi or rales  Abd: soft, nontender, no hepatomegaly  Ext: no edema Musculoskeletal:  No deformities, BUE and BLE strength normal and equal Skin: warm and dry  Neuro:  CNs 2-12 intact, no focal abnormalities noted Psych:  Normal affect   EKG:  The EKG was personally reviewed and demonstrates: Complete heart block with rate 29. Telemetry:  Telemetry was personally reviewed and demonstrates: Complete heart block with rates in 30s  Relevant CV Studies: Echo 05/19/2023: Normal EF> 55%.  No LVH.  Normal LA pressures.  GR 1 DD.  Normal RV function.  Essentially normal valves.  Trivial MR and PR.  Laboratory Data:  High Sensitivity Troponin:   Recent Labs  Lab 08/26/23 1554  TROPONINIHS 25*     Chemistry Recent Labs  Lab 08/26/23 1554  NA 135  K 4.8  CL 100  CO2 22  GLUCOSE 294*  BUN 31*  CREATININE 1.32*  CALCIUM 9.1  MG 2.3  GFRNONAA 42*  ANIONGAP 13    Recent Labs  Lab 08/26/23 1554  PROT 6.6  ALBUMIN 3.9  AST 26  ALT 21  ALKPHOS 48  BILITOT 1.1   Lipids No results for input(s): "CHOL", "TRIG", "HDL", "LABVLDL", "LDLCALC", "CHOLHDL" in the last 168 hours.  Hematology Recent Labs  Lab 08/26/23 1554  WBC 10.9*  RBC 3.73*  HGB 12.0  HCT 36.5  MCV 97.9  MCH 32.2  MCHC 32.9  RDW 12.7  PLT 213   Thyroid  No results for input(s): "TSH", "FREET4" in the last 168 hours.   BNPNo results for input(s): "BNP", "PROBNP" in the last 168 hours.  DDimer No results for input(s): "DDIMER" in the last 168 hours.   Radiology/Studies:  No results found.   Assessment and Plan:   Syncope secondary to complete heart block Plan is to stabilize with TPN placement and transfer to [facility] for permanent pacemaker.   Risk Assessment/Risk Scores:                For questions or updates, please contact [facility] Please consult [email or web address] for contact info under    Signed, Randene Bustard, MD  08/26/2023 5:01 PM

## 2023-08-26 NOTE — Consult Note (Cosign Needed Addendum)
 [facility] CARDIOLOGY CONSULT NOTE       Patient ID: Alejandra [facility] MRN: [id number] DOB/AGE: 1945-05-30 78 y.o.  Admit date: 08/26/2023 Referring Physician Dr. Daisy [facility]  Primary Physician Alejandra Moulding, MD  Primary Cardiologist Dr. Braxton Lowe Reason for Consultation complete heart block  HPI: Alejandra Lowe is a 78 y.o. female  with a past medical history of hypertension, hyperlipidemia, type 1 diabetes, acute cognitive decline, recent episodes of syncope and collapse who presented to the ED on 08/26/2023 for fall and head laceration.  EKG in the ED revealed complete heart block.  Cardiology was consulted for further evaluation.   Patient was brought to the ED for her husband this afternoon after a fall at home which resulted in head injury.  States that she was sitting at their counter whenever she just fell over and hit her head on the floor.  She was noticed to be bleeding from her head after this time and was brought to the ED for further evaluation.  Lab work thus far in the ED reveals hemoglobin 12.0, WBC 10.9, platelets 213.  Other lab work pending.  EKG revealed complete heart block with a heart rate of 29 bpm.  Patient has a head laceration and will be going for CT head and cervical spine, per ED provider she will need staples after this.  At the time my evaluation patient is resting comfortably in hospital bed with husband present at bedside.  She states that she has had some issues with dizziness/lightheadedness and some syncopal episodes as of recently.  Husband reports that earlier today after getting home from the chiropractor she fell over and hit her head.  They deny any overt loss of consciousness.  Given her head injury she was brought to the ED at which time she was found to be in complete heart block.  Need for temporary pacemaker was discussed with patient and husband at bedside and they are amenable to proceeding.  Review of systems complete and found to be  negative unless listed above    Past Medical History:  Diagnosis Date   Arthritis    Basal cell carcinoma 05/27/2020   R upper back - ED&C   Breast cancer (HCC) 1997   left breast, radiation, chemo   Diabetes mellitus without complication (HCC)    Dysplastic nevus 02/24/2021   Left distal ant thigh   Glaucoma    Hyperlipidemia    Hypertension    Personal history of chemotherapy 1997   BREAST CA   Personal history of radiation therapy 1997   BREAST CA   Seizure (HCC)     Past Surgical History:  Procedure Laterality Date   BREAST EXCISIONAL BIOPSY Left 1997   positive   BREAST LUMPECTOMY Left 1997   W/ radiation   ROTATOR CUFF REPAIR      (Not in a hospital admission)  Social History   Socioeconomic History   Marital status: Married    Spouse name: Not on file   Number of children: Not on file   Years of education: Not on file   Highest education level: Not on file  Occupational History   Not on file  Tobacco Use   Smoking status: Never   Smokeless tobacco: Never  Vaping Use   Vaping status: Never Used  Substance and Sexual Activity   Alcohol use: Yes    Alcohol/week: 7.0 standard drinks of alcohol    Types: 7 Glasses of wine per week   Drug use:  Never   Sexual activity: Yes    Birth control/protection: Post-menopausal  Other Topics Concern   Not on file  Social History Narrative   Not on file   Social Drivers of Health   Financial Resource Strain: Low Risk  (08/19/2023)   Received from [facility]   Overall Financial Resource Strain (CARDIA)    Difficulty of Paying Living Expenses: Not hard at all  Food Insecurity: No Food Insecurity (08/19/2023)   Received from [facility]   Hunger Vital Sign    Worried About Running Out of Food in the Last Year: Never true    Ran Out of Food in the Last Year: Never true  Transportation Needs: No Transportation Needs (08/19/2023)   Received from [facility] - Transportation    In the past 12 months, has lack of transportation kept you from medical appointments or from getting medications?: No    Lack of Transportation (Non-Medical): No  Physical Activity: Not on file  Stress: Not on file  Social Connections: Not on file  Intimate Partner Violence: Not on file    Family History  Problem Relation Age of Onset   Breast cancer Mother 88   Breast cancer Sister 85     Vitals:   08/26/23 1559 08/26/23 1628  BP:  (!) 120/40  Pulse: (!) 28   Resp: 18   Temp:  98.1 F (36.7 C)  TempSrc:  Oral  SpO2: 100%   Weight: 63.5 kg   Height: 5\' 7"  (1.702 m)     PHYSICAL EXAM General: Well-appearing elderly female, well nourished, in no acute distress. HEENT: Normocephalic and atraumatic. Neck: No JVD.  Lungs: Normal respiratory effort on room air. Clear bilaterally to auscultation. No wheezes, crackles, rhonchi.  Heart: Slow rate. Normal S1 and S2 without gallops or murmurs.  Abdomen: Non-distended appearing.  Msk: Normal strength and tone for age. Extremities: Warm and well perfused. No clubbing, cyanosis.  No edema.  Neuro: Alert and oriented X 3. Psych: Answers questions appropriately.   Labs: Basic Metabolic Panel: No results for input(s): "NA", "K", "CL", "CO2", "GLUCOSE", "BUN", "CREATININE", "CALCIUM", "MG", "PHOS" in the last 72 hours. Liver Function Tests: No results for input(s): "AST", "ALT", "ALKPHOS", "BILITOT", "PROT", "ALBUMIN" in the last 72 hours. No results for input(s): "LIPASE", "AMYLASE" in the last 72 hours. CBC: Recent Labs    08/26/23 1554  WBC 10.9*  NEUTROABS 9.1*  HGB 12.0  HCT 36.5  MCV 97.9  PLT 213   Cardiac Enzymes: Recent Labs    08/26/23 1554  TROPONINIHS 25*   BNP: No results for input(s): "BNP" in the last 72 hours. D-Dimer: No results for input(s): "DDIMER" in the last 72 hours. Hemoglobin A1C: No results for input(s): "HGBA1C" in the last 72 hours. Fasting Lipid Panel: No  results for input(s): "CHOL", "HDL", "LDLCALC", "TRIG", "CHOLHDL", "LDLDIRECT" in the last 72 hours. Thyroid  Function Tests: No results for input(s): "TSH", "T4TOTAL", "T3FREE", "THYROIDAB" in the last 72 hours.  Invalid input(s): "FREET3" Anemia Panel: No results for input(s): "VITAMINB12", "FOLATE", "FERRITIN", "TIBC", "IRON", "RETICCTPCT" in the last 72 hours.   Radiology: No results found.  ECHO ordered  TELEMETRY reviewed by me 08/26/2023: Complete heart block rate 20-30s  EKG reviewed by me: Complete heart block  Data reviewed by me 08/26/2023: last 24h vitals tele labs imaging I/O ED provider note, admission H&P  Active Problems:   * No active hospital problems. *  ASSESSMENT AND PLAN:  BRYNLEI LEDUFF is a 78 y.o. female  with a past medical history of hypertension, hyperlipidemia, type 1 diabetes, acute cognitive decline, recent episodes of syncope and collapse who presented to the ED on 08/26/2023 for fall and head laceration.  EKG in the ED revealed complete heart block.  Cardiology was consulted for further evaluation.   # Complete heart block # Fall # Head laceration Patient presented to the ED after a fall and head laceration.  Initial EKG in the ED revealed complete heart block with a heart rate of 29 bpm.  CBC with minimally elevated WBC at 10.9, otherwise unremarkable.   -Troponin, CMP, magnesium, BNP, thyroid  panel in progress. -Echo ordered. -Avoid AV nodal blocking agents. -Monitor/replenish electrolytes as indicated.  -Patient is aware of the need for temporary pacemaker placement and is amenable to proceeding understanding risk/benefits of this procedure. -Dr. Addie Holstein to place temporary pacemaker. -Will reach out to EP for evaluation and need for permanent pacemaker.   This patient's plan of care was discussed and created with Dr. Custovic and she is in agreement.  Signed: Creighton Doffing, PA-C  08/26/2023, 4:50 PM [facility] Cardiology

## 2023-08-26 NOTE — ED Notes (Signed)
 Patient on defib pads. Cardiac team at bedside.

## 2023-08-26 NOTE — ED Provider Notes (Signed)
 [facility] Provider Note    Event Date/Time   First MD Initiated Contact with Patient 08/26/23 1603     (approximate)   History   Fall   HPI Alejandra Lowe is a 78 y.o. female presenting today for syncope and fall.  Prior history of DM1, HTN, HLD presenting today for syncope.  Patient states for the past month she has had intermittent episodes of lightheadedness including 4 syncopal episodes with fall.  Today she hit the left side of her head with loss of consciousness.  Not on blood thinners.  Small laceration to the left side of her head.  Otherwise denies shortness of breath, nausea, vomiting, sweating.  No prior history of any heart blocks.  Reviewed most recent cardiology notes.     Physical Exam   Triage Vital Signs: ED Triage Vitals  Encounter Vitals Group     BP --      Systolic BP Percentile --      Diastolic BP Percentile --      Pulse Rate 08/26/23 1559 (!) 28     Resp 08/26/23 1559 18     Temp --      Temp src --      SpO2 08/26/23 1559 100 %     Weight 08/26/23 1559 140 lb (63.5 kg)     Height 08/26/23 1559 5\' 7"  (1.702 m)     Head Circumference --      Peak Flow --      Pain Score 08/26/23 1601 8     Pain Loc --      Pain Education --      Exclude from Growth Chart --     Most recent vital signs: Vitals:   08/26/23 1559 08/26/23 1628  BP:  (!) 120/40  Pulse: (!) 28   Resp: 18   Temp:  98.1 F (36.7 C)  SpO2: 100%    Physical Exam: I have reviewed the vital signs and nursing notes. General: Awake, alert, no acute distress.  Nontoxic appearing. Head:  Atraumatic, normocephalic.   ENT:  EOM intact, PERRL. Oral mucosa is pink and moist with no lesions. Neck: Neck is supple with full range of motion, No meningeal signs. Cardiovascular: Bradycardia, No murmurs. Peripheral pulses palpable and equal bilaterally. Respiratory:  Symmetrical chest wall expansion.  No rhonchi, rales, or wheezes.  Good air movement throughout.  No  use of accessory muscles.   Musculoskeletal:  No cyanosis or edema. Moving extremities with full ROM Abdomen:  Soft, nontender, nondistended. Neuro:  GCS 15, moving all four extremities, interacting appropriately. Speech clear. Psych:  Calm, appropriate.   Skin:  Warm, dry, no rash.    ED Results / Procedures / Treatments   Labs (all labs ordered are listed, but only abnormal results are displayed) Labs Reviewed  CBC WITH DIFFERENTIAL/PLATELET - Abnormal; Notable for the following components:      Result Value   WBC 10.9 (*)    RBC 3.73 (*)    Neutro Abs 9.1 (*)    All other components within normal limits  COMPREHENSIVE METABOLIC PANEL WITH GFR - Abnormal; Notable for the following components:   Glucose, Bld 294 (*)    BUN 31 (*)    Creatinine, Ser 1.32 (*)    GFR, Estimated 42 (*)    All other components within normal limits  TROPONIN I (HIGH SENSITIVITY) - Abnormal; Notable for the following components:   Troponin I (High Sensitivity) 25 (*)  All other components within normal limits  MAGNESIUM  BRAIN NATRIURETIC PEPTIDE  THYROID  PANEL WITH TSH  CBC  BASIC METABOLIC PANEL WITH GFR     EKG My EKG interpretation: Rate of 29, third-degree AV block with junctional rhythm.  Incomplete right bundle branch block.  No acute ST elevations or depressions   RADIOLOGY Independently interpreted CT head and CT C-spine with no obvious traumatic pathology although radiology formal read pending at time of admission   PROCEDURES:  Critical Care performed: Yes, see critical care procedure note(s)  .Critical Care  Performed by: Kandee Orion, MD Authorized by: Kandee Orion, MD   Critical care provider statement:    Critical care time (minutes):  30   Critical care was necessary to treat or prevent imminent or life-threatening deterioration of the following conditions:  Cardiac failure (Third-degree heart block)   Critical care was time spent personally by me on the  following activities:  Development of treatment plan with patient or surrogate, discussions with consultants, evaluation of patient's response to treatment, examination of patient, ordering and review of laboratory studies, ordering and review of radiographic studies, ordering and performing treatments and interventions, pulse oximetry, re-evaluation of patient's condition and review of old charts   I assumed direction of critical care for this patient from another provider in my specialty: no     Care discussed with: admitting provider   .Laceration Repair  Date/Time: 08/26/2023 4:57 PM  Performed by: Kandee Orion, MD Authorized by: Kandee Orion, MD   Consent:    Consent obtained:  Verbal   Consent given by:  Patient   Risks, benefits, and alternatives were discussed: yes     Risks discussed:  Infection, pain, poor cosmetic result and poor wound healing   Alternatives discussed:  No treatment Universal protocol:    Patient identity confirmed:  Verbally with patient Anesthesia:    Anesthesia method:  None Laceration details:    Location:  Scalp   Scalp location:  L parietal   Length (cm):  3   Depth (mm):  2 Pre-procedure details:    Preparation:  Patient was prepped and draped in usual sterile fashion and imaging obtained to evaluate for foreign bodies Exploration:    Hemostasis achieved with:  Direct pressure   Imaging outcome: foreign body not noted     Wound exploration: wound explored through full range of motion   Skin repair:    Repair method:  Staples   Number of staples:  2 Approximation:    Approximation:  Close Repair type:    Repair type:  Simple Post-procedure details:    Dressing:  Open (no dressing)   Procedure completion:  Tolerated well, no immediate complications    MEDICATIONS ORDERED IN ED: Medications  Heparin (Porcine) in NaCl 1000-0.9 UT/500ML-% SOLN (500 mLs  Given 08/26/23 1711)  Tdap (BOOSTRIX) injection 0.5 mL (0.5 mLs Intramuscular Given  08/26/23 1659)     IMPRESSION / MDM / ASSESSMENT AND PLAN / ED COURSE  I reviewed the triage vital signs and the nursing notes.                              Differential diagnosis includes, but is not limited to, third-degree heart block, electrolyte abnormality, NSTEMI, heart failure, ICH, soft tissue injury  Patient's presentation is most consistent with acute presentation with potential threat to life or bodily function.  Patient is a 78 year old female presenting today  for fall secondary to syncope.  On arrival she is bradycardic in the 30s.  EKG confirms third-degree heart block.  Blood pressure with systolic 120.  Asymptomatic at rest with no breathing difficulties.  Placed on pads in the room as a precaution.  Cardiology emergently consulted and at bedside with plan for TVP placement.  CT imaging ordered of head and C-spine to evaluate for any traumatic injuries.  No obvious traumatic pathology seen on CT and staples applied to left parietal scalp.  2 staples.  Formal CT read pending as patient was taken emergently to the Cath Lab for TVP placement.  ICU team aware to see patient afterwards.  Laboratory workup otherwise mostly reassuring.  The patient is on the cardiac monitor to evaluate for evidence of arrhythmia and/or significant heart rate changes. Clinical Course as of 08/26/23 1715  Thu Aug 26, 2023  1641 Cardiology team aware and at bedside. Planning to go to cath lab for TVP placement. ICU team notified and aware of patient [DW]    Clinical Course User Index [DW] Kandee Orion, MD     FINAL CLINICAL IMPRESSION(S) / ED DIAGNOSES   Final diagnoses:  Third degree heart block (HCC)  Fall, initial encounter  Laceration of scalp without foreign body, initial encounter     Rx / DC Orders   ED Discharge Orders     None        Note:  This document was prepared using Dragon voice recognition software and may include unintentional dictation errors.   Kandee Orion,  MD 08/26/23 [phone]

## 2023-08-26 NOTE — Discharge Summary (Cosign Needed Addendum)
 Physician Discharge Summary  Patient ID: Alejandra Lowe MRN: [id number] DOB/AGE: 10/28/1945 78 y.o.  Admit date: 08/26/2023 Discharge date: 08/26/2023    Discharge Diagnoses:  Complete heart block s/p right internal jugular temporary venous pacemaker placement  Syncope and fall resulting in left-sided head laceration  #Mildly elevated troponin suspect secondary to demand ischemia  HLD  Type I diabetes mellitus at baseline use insulin  pump Dementia                                                                               DISCHARGE SUMMARY   Alejandra Lowe is a 78 y.o. y/o female with a PMH of Arthritis, Basal Cell Carcinoma, Left-Sided Breast Cancer (underwent radiation and chemotherapy), Type II Diabetes Mellitus, Glaucoma, HLD, HTN, Seizures, and Type I Diabetes Mellitus.  She   presented to [facility] ER on 05/15 following a syncopal episode resulting in a fall.  Pt reported for the past month she has had intermittent episodes of lightheadedness including 4 syncopal episodes with fall.  However, today she hit the left side of her head resulting in loss of consciousness and noted to have a small laceration to the left side of her head.  She denies taking blood thinners.  Upon reviewing outpatient medication list she does not take beta-blockers or antiarrhythmic medications.  Although pts husband reports pts pharmacist did inform him some of the pts otc medications for memory can cause bradycardia    [facility] ED Course Upon arrival to the ER EKG revealed a third degree AV block with junctional rhythm; incomplete RBBB; no acute ST elevation or depression. EDP applied 2 staples to left side of scalp due to laceration.  Cardiologist Dr. Addie Holstein consulted by EDP and pt taken emergently to cardiac cath lab for temporary transvenous pacemaker placement.  PCCM team contacted for ICU admission pending transfer to [facility] for potential need for permanent pacemaker placement accepting physician  Dr. Julane Ny.      Significant lab results: glucose 294/BUN 31/creatinine 1.32/BNP 710.2/troponin 25/wbc 10.9   CT Head/Cervical Spine: No acute intracranial pathology. Small-vessel white matter disease. No fracture or static subluxation of the cervical spine. Mild disc space height loss and osteophytosis from C5-C7 with otherwise intact disc spaces.  SIGNIFICANT EVENTS 05/15: Pt admitted with complete heart block s/p temporary transvenous pacemaker placement pending transfer to [facility] for possible need for permanent pacemaker placement   MICRO DATA  MRSA PCR 05/15>>  ANTIBIOTICS None   CONSULTS Cardiology  [facility] PCCM team   TUBES / LINES Right internal temporary transvenous pacemaker (settings rate 70/output 5.0/sense 2.0) 05/15>>  Discharge Exam: General: Acutely-ill appearing female, NAD on RA  HENT: Supple, no JVD  Lungs: Clear throughout, even, non labored  Cardiovascular: Paced rhythm, no m/r/g, 2+ radial/1+ distal pulses, no edema; right internal jugular transvenous pacemaker dressing dry/intact  Abdomen: +BS x4, soft, non tender, non distended  Extremities: Normal bulk and tone, moves all extremities  Skin: Left head laceration with 2 staples present, left shin abrasion dressing dry/intact present on admission, chronic vascular discoloration bilateral lower extremities  Neuro: Alert and oriented, following commands, PERRLA  GU: Deferred    Vitals:   08/26/23 1559 08/26/23 1628  08/26/23 1827  BP:  (!) 120/40 (!) 150/75  Pulse: (!) 28  69  Resp: 18  14  Temp:  98.1 F (36.7 C) 98.6 F (37 C)  TempSrc:  Oral Oral  SpO2: 100%  100%  Weight: 63.5 kg  73.9 kg  Height: 5\' 7"  (1.702 m)  5\' 7"  (1.702 m)     Discharge Labs  BMET Recent Labs  Lab 08/26/23 1554  NA 135  K 4.8  CL 100  CO2 22  GLUCOSE 294*  BUN 31*  CREATININE 1.32*  CALCIUM 9.1  MG 2.3    CBC Recent Labs  Lab 08/26/23 1554  HGB 12.0  HCT 36.5  WBC 10.9*  PLT 213     Anti-Coagulation No results for input(s): "INR" in the last 168 hours.      Follow-up Information     Custovic, Lanell Pinta, DO. Go in 1 week(s).   Specialty: Cardiology Contact information: [address] [place] [address] [phone]                  Allergies as of 08/26/2023   No Known Allergies      Medication List     STOP taking these medications    atorvastatin 10 MG tablet Commonly known as: LIPITOR   calcium carbonate 600 MG Tabs tablet Commonly known as: OS-CAL   FreeStyle Libre 14 Day Reader [employer] 14 Day Sensor Misc   glucagon 1 MG injection   insulin  aspart 100 UNIT/ML injection Commonly known as: novoLOG Replaced by: insulin  aspart 100 UNIT/ML injection   nitrofurantoin  (macrocrystal-monohydrate) 100 MG capsule Commonly known as: MACROBID    omega-3 acid ethyl esters 1 g capsule Commonly known as: LOVAZA   phenazopyridine  200 MG tablet Commonly known as: Pyridium    Premarin  vaginal cream Generic drug: conjugated estrogens    vitamin E 180 MG (400 UNITS) capsule       TAKE these medications    insulin  aspart 100 UNIT/ML injection Commonly known as: novoLOG Inject 0-9 Units into the skin every 4 (four) hours. Replaces: insulin  aspart 100 UNIT/ML injection          Disposition: Transferring to [facility]   Janey Meek, [place]  Pulmonary/Critical Care Pager [phone] (please enter 7 digits) PCCM Consult Pager [phone] (please enter 7 digits)

## 2023-08-26 NOTE — ED Notes (Signed)
 CT called to obtain STAT CT due to patient being next to go to the cath lab.

## 2023-08-26 NOTE — H&P (Cosign Needed Addendum)
 NAME:  Alejandra Lowe, MRN:  [id number], DOB:  June 17, 1945, LOS: 0 ADMISSION DATE:  08/26/2023, CONSULTATION DATE: 08/26/2023 REFERRING MD: Dr. Karlynn Oyster, CHIEF COMPLAINT: Syncopal Episode    History of Present Illness:  This is a 78 yo female who presented to [facility] ER on 05/15 following a syncopal episode resulting in a fall.  Pt reported for the past month she has had intermittent episodes of lightheadedness including 4 syncopal episodes with fall.  However, today she hit the left side of her head resulting in loss of consciousness and noted to have a small laceration to the left side of her head.  She denies taking blood thinners.  Upon reviewing outpatient medication list she does not take beta-blockers or antiarrhythmic medications.  Although pts husband reports pts pharmacist did inform him some of the pts otc medications for memory can cause bradycardia   ED Course Upon arrival to the ER EKG revealed a third degree AV block with junctional rhythm; incomplete RBBB; no acute ST elevation or depression. EDP applied 2 staples to left side of scalp due to laceration.  Cardiologist Dr. Addie Holstein consulted by EDP and pt taken emergently to cardiac cath lab for temporary venous pacemaker placement.  PCCM team contacted for ICU admission pending transfer to [facility] for potential need for permanent pacemaker placement accepting physician Dr. Julane Ny.     Significant lab results: glucose 294/BUN 31/creatinine 1.32/BNP 710.2/troponin 25/wbc 10.9  CT Head/Cervical Spine: No acute intracranial pathology. Small-vessel white matter disease. No fracture or static subluxation of the cervical spine. Mild disc space height loss and osteophytosis from C5-C7 with otherwise intact disc spaces.  Pertinent  Medical History  Arthritis  Basal Cell Carcinoma Left-Sided Breast Cancer (underwent radiation and chemotherapy) Type II Diabetes Mellitus  Glaucoma HLD HTN  Seizures due to hypoglycemic episodes   Type I Diabetes Mellitus   Significant Hospital Events: Including procedures, antibiotic start and stop dates in addition to other pertinent events   05/15: Pt admitted with complete heart block s/p right internal jugular temporary transvenous pacemaker placement pending transfer to [facility] for possible need for permanent pacemaker placement   Interim History / Subjective:  Pt awake in no distress c/o chronic left shoulder pain   Objective    Blood pressure (!) 120/40, pulse (!) 28, temperature 98.1 F (36.7 C), temperature source Oral, resp. rate 18, height 5\' 7"  (1.702 m), weight 63.5 kg, SpO2 100%.       No intake or output data in the 24 hours ending 08/26/23 1706 Filed Weights   08/26/23 1559  Weight: 63.5 kg    Examination: General: Acutely-ill appearing female, NAD on RA  HENT: supple, no JVD  Lungs: Clear throughout, even, non labored  Cardiovascular: Paced rhythm, no m/r/g, 2+ radial/1+ distal pulses, no edema; right internal jugular transvenous pacemaker dressing dry/intact  Abdomen: +BS x4, soft, non tender, non distended  Extremities: Normal bulk and tone, moves all extremities  Skin: left head laceration with 2 staples present, left shin abrasion dressing dry/intact present on admission, chronic vascular discoloration bilateral lower extremities  Neuro: Alert and oriented, following commands, PERRLA  GU: Deferred   Resolved problem list   Assessment and Plan   #Dementia  - Maintain sleep/wake cycle - Frequent reorientation  - Provide supportive care   #Complete heart block s/p temporary venous pacemaker placement  #Syncope and fall resulting left-sided head laceration  #Mildly elevated troponin suspect secondary to demand ischemia  Hx: HTN and HLD   -  Continuous telemetry monitoring  - BNP 710.2: Echo pending  - Thyroid  panel pending  - Trend troponin until peaked  - Avoid AV nodal blocking agents  - Replace electrolytes as indicated  - Fall  precautions  - Once able to take po's resume outpatient atorvastatin  - Pending transfer to [facility] for evaluation and need for permanent pacemaker placement by EP   #Type I diabetes mellitus at baseline uses insulin  pump - CBG's q4hrs  - Sensitive SSI  - Follow hyper/hypoglycemic protocol  - Instructed pts husband to take insulin  pump home for now  - Diabetes coordinator consulted appreciate input   Best Practice (right click and "Reselect all SmartList Selections" daily)   Diet/type: NPO DVT prophylaxis SCD Pressure ulcer(s): N/A GI prophylaxis: N/A Lines: Right internal jugular temporary transvenous pacemaker placement  Foley:  N/A Code Status:  full code Last date of multidisciplinary goals of care discussion [N/A]  Updated pts husband Donnelle Mcbroom at bedside regarding pts condition and current plan of care  Labs   CBC: Recent Labs  Lab 08/26/23 1554  WBC 10.9*  NEUTROABS 9.1*  HGB 12.0  HCT 36.5  MCV 97.9  PLT 213    Basic Metabolic Panel: Recent Labs  Lab 08/26/23 1554  NA 135  K 4.8  CL 100  CO2 22  GLUCOSE 294*  BUN 31*  CREATININE 1.32*  CALCIUM 9.1  MG 2.3   GFR: Estimated Creatinine Clearance: 34.7 mL/min (A) (by C-G formula based on SCr of 1.32 mg/dL (H)). Recent Labs  Lab 08/26/23 1554  WBC 10.9*    Liver Function Tests: Recent Labs  Lab 08/26/23 1554  AST 26  ALT 21  ALKPHOS 48  BILITOT 1.1  PROT 6.6  ALBUMIN 3.9   No results for input(s): "LIPASE", "AMYLASE" in the last 168 hours. No results for input(s): "AMMONIA" in the last 168 hours.  ABG No results found for: "PHART", "PCO2ART", "PO2ART", "HCO3", "TCO2", "ACIDBASEDEF", "O2SAT"   Coagulation Profile: No results for input(s): "INR", "PROTIME" in the last 168 hours.  Cardiac Enzymes: No results for input(s): "CKTOTAL", "CKMB", "CKMBINDEX", "TROPONINI" in the last 168 hours.  HbA1C: No results found for: "HGBA1C"  CBG: No results for input(s): "GLUCAP" in  the last 168 hours.  Review of Systems: Positives in BOLD   Gen: Denies fever, chills, weight change, fatigue, night sweats HEENT: Denies blurred vision, double vision, hearing loss, tinnitus, sinus congestion, rhinorrhea, sore throat, neck stiffness, dysphagia PULM: Denies shortness of breath, cough, sputum production, hemoptysis, wheezing CV: chest pain, edema, orthopnea, paroxysmal nocturnal dyspnea, palpitations GI: Denies abdominal pain, nausea, vomiting, diarrhea, hematochezia, melena, constipation, change in bowel habits GU: Denies dysuria, hematuria, polyuria, oliguria, urethral discharge Endocrine: Denies hot or cold intolerance, polyuria, polyphagia or appetite change Derm: Denies rash, dry skin, scaling or peeling skin change Heme: Denies easy bruising, bleeding, bleeding gums Neuro: syncope and fall resulting in left-sided head laceration, headache, numbness, weakness, slurred speech, loss of memory or consciousness  Past Medical History:  She,  has a past medical history of Arthritis, Basal cell carcinoma (05/27/2020), Breast cancer (HCC) (1997), Diabetes mellitus without complication (HCC), Dysplastic nevus (02/24/2021), Glaucoma, Hyperlipidemia, Hypertension, Personal history of chemotherapy (1997), Personal history of radiation therapy (1997), and Seizure (HCC).   Surgical History:   Past Surgical History:  Procedure Laterality Date   BREAST EXCISIONAL BIOPSY Left 1997   positive   BREAST LUMPECTOMY Left 1997   W/ radiation   ROTATOR CUFF REPAIR  Social History:   reports that she has never smoked. She has never used smokeless tobacco. She reports current alcohol use of about 7.0 standard drinks of alcohol per week. She reports that she does not use drugs.   Family History:  Her family history includes Breast cancer (age of onset: 80) in her sister; Breast cancer (age of onset: 78) in her mother.   Allergies No Known Allergies   Home Medications  Prior to  Admission medications   Medication Sig Start Date End Date Taking? Authorizing Provider  atorvastatin (LIPITOR) 10 MG tablet  11/01/17   [provider]  calcium carbonate (OS-CAL) 600 MG TABS tablet Take 600 mg by mouth daily with breakfast.    [provider]  conjugated estrogens  (PREMARIN ) vaginal cream Apply a pea-sized amount around the opening to the urethra 3 times weekly. 02/28/20   Vaillancourt, Samantha, PA-C  Continuous Blood Gluc Receiver (FREESTYLE LIBRE 14 DAY READER) DEVI Use 1 kit as directed E10.649 07/27/17   [provider]  Continuous Blood Gluc Sensor (FREESTYLE LIBRE 14 DAY SENSOR) MISC USE 1 EACH EVERY 14 (FOURTEEN) DAYS E10.649 12/08/17   [provider]  glucagon 1 MG injection Take as directed 07/27/17   [provider]  insulin  aspart (NOVOLOG) 100 UNIT/ML injection USE UP TO 40 UNITS PER DAY IN THE ACCUCHEK PUMP AS DIRECTED. 04/20/16   [provider]  nitrofurantoin , macrocrystal-monohydrate, (MACROBID ) 100 MG capsule Take 1 capsule (100 mg total) by mouth daily. 12/25/21   Vaillancourt, Samantha, PA-C  omega-3 acid ethyl esters (LOVAZA) 1 g capsule Take 1 g by mouth daily.    [provider]  phenazopyridine  (PYRIDIUM ) 200 MG tablet Take 1 tablet (200 mg total) by mouth 3 (three) times daily as needed for pain. Do not take for longer than 2 days continuously. 12/25/21   Vaillancourt, Samantha, PA-C  vitamin E 400 UNIT capsule Take 400 Units by mouth daily.    [provider]     Critical care time: 60 minutes      Janey Meek, AGNP  Pulmonary/Critical Care Pager [phone] (please enter 7 digits) PCCM Consult Pager [phone] (please enter 7 digits)

## 2023-08-26 NOTE — Consult Note (Signed)
 PHARMACY CONSULT NOTE - ELECTROLYTES  Pharmacy Consult for Electrolyte Monitoring and Replacement   Recent Labs: Height: 5\' 7"  (170.2 cm) Weight: 63.5 kg (140 lb) IBW/kg (Calculated) : 61.6 Estimated Creatinine Clearance: 34.7 mL/min (A) (by C-G formula based on SCr of 1.32 mg/dL (H)).  Potassium (mmol/L)  Date Value  08/26/2023 4.8   Magnesium (mg/dL)  Date Value  16/01/9603 2.3   Calcium (mg/dL)  Date Value  54/12/8117 9.1   Albumin (g/dL)  Date Value  14/78/2956 3.9   Sodium (mmol/L)  Date Value  08/26/2023 135   Assessment  Alejandra Lowe is a 78 y.o. female presenting today after syncope and a fall. PMH significant for HTN, HLD, T1D, acute cognitive decline, & recent episodes of syncope and collapse. Pharmacy has been consulted to monitor and replace electrolytes.  Diet: Not yet ordered MIVF: None  Pertinent medications: None   Goal of Therapy: Electrolytes WNL  Plan:  No electrolyte replacement indicated at this time  Check BMP, Mg, Phos with AM labs  Thank you for allowing pharmacy to be a part of this patient's care.  Alejandra Lowe, PharmD Pharmacy Resident  08/26/2023 5:21 PM

## 2023-08-26 NOTE — Progress Notes (Signed)
 Received from cath lab in stable condition.   Temp pacer in place.  Rate 70 ,5 mA, 2 sensitivity

## 2023-08-26 NOTE — ED Triage Notes (Signed)
 Patient was sitting at table, felt dizzy and fell backwards and hit head on tile floor. Bleeding controlled at triage.

## 2023-08-27 ENCOUNTER — Inpatient Hospital Stay (HOSPITAL_COMMUNITY)
Admission: EM | Admit: 2023-08-27 | Discharge: 2023-08-30 | DRG: 244 | Disposition: A | Discharge status: Home / Self Care | Source: Other Acute Inpatient Hospital | Attending: Cardiovascular Disease | Admitting: Cardiovascular Disease

## 2023-08-27 ENCOUNTER — Inpatient Hospital Stay (HOSPITAL_COMMUNITY)
Admit: 2023-08-27 | Discharge: 2023-08-27 | Disposition: A | Discharge status: Home / Self Care | Attending: Cardiology | Admitting: Cardiology

## 2023-08-27 DIAGNOSIS — I442 Atrioventricular block, complete: Secondary | ICD-10-CM | POA: Diagnosis not present

## 2023-08-27 DIAGNOSIS — R001 Bradycardia, unspecified: Secondary | ICD-10-CM | POA: Diagnosis not present

## 2023-08-27 DIAGNOSIS — E109 Type 1 diabetes mellitus without complications: Secondary | ICD-10-CM | POA: Diagnosis present

## 2023-08-27 DIAGNOSIS — F03A0 Unspecified dementia, mild, without behavioral disturbance, psychotic disturbance, mood disturbance, and anxiety: Secondary | ICD-10-CM | POA: Diagnosis present

## 2023-08-27 DIAGNOSIS — Z853 Personal history of malignant neoplasm of breast: Secondary | ICD-10-CM

## 2023-08-27 DIAGNOSIS — Z9221 Personal history of antineoplastic chemotherapy: Secondary | ICD-10-CM | POA: Diagnosis not present

## 2023-08-27 DIAGNOSIS — E785 Hyperlipidemia, unspecified: Secondary | ICD-10-CM | POA: Diagnosis present

## 2023-08-27 DIAGNOSIS — R55 Syncope and collapse: Secondary | ICD-10-CM | POA: Diagnosis present

## 2023-08-27 DIAGNOSIS — Z950 Presence of cardiac pacemaker: Secondary | ICD-10-CM | POA: Diagnosis not present

## 2023-08-27 DIAGNOSIS — I10 Essential (primary) hypertension: Secondary | ICD-10-CM | POA: Diagnosis present

## 2023-08-27 DIAGNOSIS — Z85828 Personal history of other malignant neoplasm of skin: Secondary | ICD-10-CM

## 2023-08-27 DIAGNOSIS — Z923 Personal history of irradiation: Secondary | ICD-10-CM | POA: Diagnosis not present

## 2023-08-27 DIAGNOSIS — Z794 Long term (current) use of insulin: Secondary | ICD-10-CM | POA: Diagnosis not present

## 2023-08-27 DIAGNOSIS — Y92000 Kitchen of unspecified non-institutional (private) residence as  the place of occurrence of the external cause: Secondary | ICD-10-CM | POA: Diagnosis not present

## 2023-08-27 DIAGNOSIS — W07XXXA Fall from chair, initial encounter: Secondary | ICD-10-CM | POA: Diagnosis not present

## 2023-08-27 DIAGNOSIS — Z23 Encounter for immunization: Secondary | ICD-10-CM | POA: Diagnosis present

## 2023-08-27 LAB — ECHOCARDIOGRAM COMPLETE
AR max vel: 2.5 cm2
AV Area VTI: 2.27 cm2
AV Area mean vel: 2.08 cm2
AV Mean grad: 3 mmHg
AV Peak grad: 5.8 mmHg
Ao pk vel: 1.21 m/s
Area-P 1/2: 2.02 cm2
Height: 67 in
MV VTI: 1.36 cm2
S' Lateral: 3 cm
Weight: 2606.72 [oz_av]

## 2023-08-27 LAB — CBC
HCT: 32.5 % — ABNORMAL LOW (ref 36.0–46.0)
Hemoglobin: 11 g/dL — ABNORMAL LOW (ref 12.0–15.0)
MCH: 32.4 pg (ref 26.0–34.0)
MCHC: 33.8 g/dL (ref 30.0–36.0)
MCV: 95.6 fL (ref 80.0–100.0)
Platelets: 179 10*3/uL (ref 150–400)
RBC: 3.4 MIL/uL — ABNORMAL LOW (ref 3.87–5.11)
RDW: 12.6 % (ref 11.5–15.5)
WBC: 8.1 10*3/uL (ref 4.0–10.5)
nRBC: 0 % (ref 0.0–0.2)

## 2023-08-27 LAB — GLUCOSE, CAPILLARY
Glucose-Capillary: 125 mg/dL — ABNORMAL HIGH (ref 70–99)
Glucose-Capillary: 161 mg/dL — ABNORMAL HIGH (ref 70–99)
Glucose-Capillary: 179 mg/dL — ABNORMAL HIGH (ref 70–99)
Glucose-Capillary: 183 mg/dL — ABNORMAL HIGH (ref 70–99)
Glucose-Capillary: 196 mg/dL — ABNORMAL HIGH (ref 70–99)
Glucose-Capillary: 235 mg/dL — ABNORMAL HIGH (ref 70–99)
Glucose-Capillary: 274 mg/dL — ABNORMAL HIGH (ref 70–99)
Glucose-Capillary: 47 mg/dL — ABNORMAL LOW (ref 70–99)

## 2023-08-27 LAB — MAGNESIUM: Magnesium: 2.1 mg/dL (ref 1.7–2.4)

## 2023-08-27 LAB — BASIC METABOLIC PANEL WITH GFR
Anion gap: 9 (ref 5–15)
BUN: 32 mg/dL — ABNORMAL HIGH (ref 8–23)
CO2: 22 mmol/L (ref 22–32)
Calcium: 8.6 mg/dL — ABNORMAL LOW (ref 8.9–10.3)
Chloride: 106 mmol/L (ref 98–111)
Creatinine, Ser: 0.91 mg/dL (ref 0.44–1.00)
GFR, Estimated: >60 mL/min (ref >60)
Glucose, Bld: 241 mg/dL — ABNORMAL HIGH (ref 70–99)
Potassium: 3.6 mmol/L (ref 3.5–5.1)
Sodium: 137 mmol/L (ref 135–145)

## 2023-08-27 LAB — PHOSPHORUS: Phosphorus: 3.1 mg/dL (ref 2.5–4.6)

## 2023-08-27 LAB — MRSA NEXT GEN BY PCR, NASAL: MRSA by PCR Next Gen: NOT DETECTED

## 2023-08-27 MED ORDER — ATORVASTATIN CALCIUM 10 MG PO TABS
10.0000 mg | ORAL_TABLET | Freq: Every day | ORAL | Status: DC
  Administered 2023-08-28 – 2023-08-30 (×3): 10 mg via ORAL
  Filled 2023-08-27 (×3): qty 1

## 2023-08-27 MED ORDER — DEXTROSE 50 % IV SOLN
25.0000 g | INTRAVENOUS | Status: AC
Start: 2023-08-27 — End: 2023-08-27
  Administered 2023-08-27: 25 g via INTRAVENOUS

## 2023-08-27 MED ORDER — TRAMADOL HCL 50 MG PO TABS
50.0000 mg | ORAL_TABLET | Freq: Two times a day (BID) | ORAL | Status: DC | PRN
  Administered 2023-08-27 – 2023-08-28 (×2): 50 mg via ORAL
  Filled 2023-08-27 (×2): qty 1

## 2023-08-27 MED ORDER — HYDROCODONE-ACETAMINOPHEN 5-325 MG PO TABS
1.0000 | ORAL_TABLET | ORAL | Status: DC | PRN
  Administered 2023-08-27 (×2): 1 via ORAL
  Filled 2023-08-27 (×2): qty 1

## 2023-08-27 MED ORDER — DEXTROSE 50 % IV SOLN
INTRAVENOUS | Status: AC
  Filled 2023-08-27: qty 50

## 2023-08-27 MED ORDER — INSULIN ASPART 100 UNIT/ML IJ SOLN
0.0000 [IU] | INTRAMUSCULAR | Status: DC
  Administered 2023-08-27: 5 [IU] via SUBCUTANEOUS
  Administered 2023-08-27: 2 [IU] via SUBCUTANEOUS
  Filled 2023-08-27 (×2): qty 1

## 2023-08-27 MED ORDER — HYDRALAZINE HCL 20 MG/ML IJ SOLN
5.0000 mg | INTRAMUSCULAR | Status: DC | PRN
  Administered 2023-08-27 – 2023-08-30 (×5): 5 mg via INTRAVENOUS
  Filled 2023-08-27 (×5): qty 1

## 2023-08-27 MED ORDER — INSULIN ASPART 100 UNIT/ML IJ SOLN: 0.0000 [IU] | INTRAMUSCULAR | Status: DC

## 2023-08-27 MED ORDER — TRAMADOL HCL 50 MG PO TABS: 50.0000 mg | ORAL_TABLET | Freq: Four times a day (QID) | ORAL | Status: DC

## 2023-08-27 MED ORDER — INSULIN ASPART 100 UNIT/ML IJ SOLN
0.0000 [IU] | INTRAMUSCULAR | Status: DC
  Administered 2023-08-27 (×2): 2 [IU] via SUBCUTANEOUS
  Administered 2023-08-28: 3 [IU] via SUBCUTANEOUS
  Administered 2023-08-28 (×2): 2 [IU] via SUBCUTANEOUS
  Administered 2023-08-28: 5 [IU] via SUBCUTANEOUS
  Administered 2023-08-28: 1 [IU] via SUBCUTANEOUS
  Administered 2023-08-28: 7 [IU] via SUBCUTANEOUS
  Administered 2023-08-29: 1 [IU] via SUBCUTANEOUS
  Administered 2023-08-29: 3 [IU] via SUBCUTANEOUS
  Administered 2023-08-29: 5 [IU] via SUBCUTANEOUS
  Administered 2023-08-29: 3 [IU] via SUBCUTANEOUS
  Administered 2023-08-29: 1 [IU] via SUBCUTANEOUS
  Administered 2023-08-29: 3 [IU] via SUBCUTANEOUS
  Administered 2023-08-30 (×2): 2 [IU] via SUBCUTANEOUS

## 2023-08-27 MED ORDER — INSULIN GLARGINE-YFGN 100 UNIT/ML ~~LOC~~ SOLN
14.0000 [IU] | Freq: Every day | SUBCUTANEOUS | Status: DC
  Administered 2023-08-28 – 2023-08-30 (×3): 14 [IU] via SUBCUTANEOUS
  Filled 2023-08-27 (×3): qty 0.14

## 2023-08-27 MED ORDER — INSULIN GLARGINE-YFGN 100 UNIT/ML ~~LOC~~ SOLN: 14.0000 [IU] | Freq: Every day | SUBCUTANEOUS | Status: DC

## 2023-08-27 MED ORDER — ACETAMINOPHEN 325 MG PO TABS
650.0000 mg | ORAL_TABLET | Freq: Four times a day (QID) | ORAL | Status: DC | PRN
Start: 2023-08-27 — End: 2023-08-28
  Administered 2023-08-27 – 2023-08-28 (×3): 650 mg via ORAL
  Filled 2023-08-27 (×3): qty 2

## 2023-08-27 MED ORDER — CHLORHEXIDINE GLUCONATE CLOTH 2 % EX PADS
6.0000 | MEDICATED_PAD | Freq: Every day | CUTANEOUS | Status: DC
  Administered 2023-08-27 – 2023-08-30 (×3): 6 via TOPICAL

## 2023-08-27 MED ORDER — INSULIN GLARGINE-YFGN 100 UNIT/ML ~~LOC~~ SOLN
14.0000 [IU] | Freq: Every day | SUBCUTANEOUS | Status: DC
  Administered 2023-08-27: 14 [IU] via SUBCUTANEOUS
  Filled 2023-08-27: qty 0.14

## 2023-08-27 MED ORDER — SODIUM CHLORIDE 0.9% FLUSH
3.0000 mL | Freq: Two times a day (BID) | INTRAVENOUS | Status: DC
  Administered 2023-08-27 – 2023-08-30 (×6): 3 mL via INTRAVENOUS

## 2023-08-27 NOTE — Inpatient Diabetes Management (Addendum)
 Inpatient Diabetes Program Recommendations  AACE/ADA: New Consensus Statement on Inpatient Glycemic Control (2015)  Target Ranges:  Prepandial:   less than 140 mg/dL      Peak postprandial:   less than 180 mg/dL (1-2 hours)      Critically ill patients:  140 - 180 mg/dL    Latest Reference Range & Units 08/26/23 19:35  Hemoglobin A1C 4.8 - 5.6 % 5.4    Latest Reference Range & Units 08/26/23 18:07 08/26/23 19:32 08/26/23 22:32 08/27/23 00:45 08/27/23 03:58 08/27/23 04:30  Glucose-Capillary 70 - 99 mg/dL 782 (H) 956 (H)  7 units Novolog  309 (H)  13 units Novolog  125 (H)  3 units Novolog  47 (L) 235 (H)    Admit with:  Syncope with Fall Complete heart block s/p right internal jugular temporary transvenous pacemaker placement pending transfer to [facility] for possible need for permanent pacemaker placement   History: Type 1 diabetes  Home DM Meds: Insulin  Pump with Novolog       Freestyle Libre 2 CGM  Current Orders: Novolog Moderate Correction Scale/ SSI (0-15 units) Q4 hours   MD- Pt will need Basal Insulin  while she is off her Insulin  pump.  The Novolog correction likely needs to be lowered as well.  Please consider:  1. Start Semglee 14 units daily  2. Reduce the Novolog SSI to the 0-9 unit scale (Sensitive scale) Q4H  ENDO: Dr. Shelvy Dickens with [facility] Last Seen 08/25/2023 Basal Rate at MN lowered to 0.5 New Total Basal: 14.4 units Medtronic 630G pump (in 2023 I tried to start her on the T slim but she could never figure out how to work it despite meeting with the trainer twice. She returned it):  Basal rates Midnight = 0.6 12 PM = 0.7 TDD basal: 15.6 units   Bolus settings I/C: 10 ISF: 30 Target Glucose: 80-120 Active insulin  time: 4 hours     --Will follow patient during hospitalization--  Langston Pippins RN, MSN, CDCES Diabetes Coordinator Inpatient Glycemic Control Team Team Pager: [phone] (8a-5p)

## 2023-08-27 NOTE — Plan of Care (Signed)

## 2023-08-27 NOTE — Progress Notes (Signed)
 Alejandra Lowe       Patient ID: Alejandra Lowe MRN: [id number] DOB/AGE: 1946-02-25 78 y.o.  Admit date: 08/26/2023 Referring Physician Dr. Daisy Du  Primary Physician Jimmy Moulding, MD  Primary Cardiologist Dr. Braxton Calico Reason for Consultation complete heart block  HPI: Alejandra Lowe is a 78 y.o. female  with a past medical history of hypertension, hyperlipidemia, type 1 diabetes, acute cognitive decline, recent episodes of syncope and collapse who presented to the ED on 08/26/2023 for fall and head laceration.  EKG in the ED revealed complete heart block.  Cardiology was consulted for further evaluation.   Interval History: -Patient seen and examined this AM and laying comfortably in hospital bed. Patient states she's feeling well overall and denies lightheadedness, dizziness, SOB or CP.  -Patients BP elevated and HR  paced in 70s. Tele showed a few 2-3 second pauses. EP adjusted temporary pacemaker settings.  -Patient underwent temporary pacemaker with Dr. Addie Holstein yesterday. VP with rate 70s.  Review of systems complete and found to be negative unless listed above    Past Medical History:  Diagnosis Date   Arthritis    Basal cell carcinoma 05/27/2020   R upper back - ED&C   Breast cancer (HCC) 1997   left breast, radiation, chemo   Diabetes mellitus without complication (HCC)    Dysplastic nevus 02/24/2021   Left distal ant thigh   Glaucoma    Hyperlipidemia    Hypertension    Personal history of chemotherapy 1997   BREAST CA   Personal history of radiation therapy 1997   BREAST CA   Seizure (HCC)     Past Surgical History:  Procedure Laterality Date   BREAST EXCISIONAL BIOPSY Left 1997   positive   BREAST LUMPECTOMY Left 1997   W/ radiation   ROTATOR CUFF REPAIR      Medications Prior to Admission  Medication Sig Dispense Refill Last Dose/Taking   atorvastatin (LIPITOR) 10 MG tablet   1 08/26/2023   calcium carbonate (OS-CAL)  600 MG TABS tablet Take 600 mg by mouth daily with breakfast.   08/26/2023   Cholecalciferol 50 MCG (2000 UT) TABS Take 2,000 Units by mouth daily.   08/26/2023   Continuous Blood Gluc Receiver (FREESTYLE LIBRE 14 DAY READER) DEVI Use 1 kit as directed E10.649   08/26/2023   Continuous Blood Gluc Sensor (FREESTYLE LIBRE 14 DAY SENSOR) MISC USE 1 EACH EVERY 14 (FOURTEEN) DAYS E10.649  11 08/26/2023   donepezil (ARICEPT) 5 MG tablet Take 5 mg by mouth at bedtime.   08/25/2023   GVOKE HYPOPEN 2-PACK 1 MG/0.2ML SOAJ Inject 0.2 mLs into the muscle once as needed.   Unknown   insulin  aspart (NOVOLOG) 100 UNIT/ML injection USE UP TO 40 UNITS PER DAY IN THE ACCUCHEK PUMP AS DIRECTED.   08/26/2023   losartan (COZAAR) 25 MG tablet Take 25 mg by mouth daily.   08/26/2023   meclizine (ANTIVERT) 12.5 MG tablet Take 12.5 mg by mouth 3 (three) times daily as needed for dizziness.   08/26/2023   triamcinolone cream (KENALOG) 0.1 % Apply 1 Application topically 2 (two) times daily as needed (itching).   Past Month   vitamin E 400 UNIT capsule Take 400 Units by mouth daily.   08/26/2023   [DISCONTINUED] conjugated estrogens  (PREMARIN ) vaginal cream Apply a pea-sized amount around the opening to the urethra 3 times weekly. 30 g 6    [DISCONTINUED] glucagon 1 MG injection Take as directed      [  DISCONTINUED] nitrofurantoin , macrocrystal-monohydrate, (MACROBID ) 100 MG capsule Take 1 capsule (100 mg total) by mouth daily. 30 capsule 11    [DISCONTINUED] omega-3 acid ethyl esters (LOVAZA) 1 g capsule Take 1 g by mouth daily.      [DISCONTINUED] phenazopyridine  (PYRIDIUM ) 200 MG tablet Take 1 tablet (200 mg total) by mouth 3 (three) times daily as needed for pain. Do not take for longer than 2 days continuously. 30 tablet 0    Social History   Socioeconomic History   Marital status: Married    Spouse name: Not on file   Number of children: Not on file   Years of education: Not on file   Highest education level: Not on file   Occupational History   Not on file  Tobacco Use   Smoking status: Never   Smokeless tobacco: Never  Vaping Use   Vaping status: Never Used  Substance and Sexual Activity   Alcohol use: Yes    Alcohol/week: 7.0 standard drinks of alcohol    Types: 7 Glasses of wine per week   Drug use: Never   Sexual activity: Yes    Birth control/protection: Post-menopausal  Other Topics Concern   Not on file  Social History Narrative   Not on file   Social Drivers of Health   Financial Resource Strain: Low Risk  (08/19/2023)   Received from [facility]   Overall Financial Resource Strain (CARDIA)    Difficulty of Paying Living Expenses: Not hard at all  Food Insecurity: No Food Insecurity (08/26/2023)   Hunger Vital Sign    Worried About Running Out of Food in the Last Year: Never true    Ran Out of Food in the Last Year: Never true  Transportation Needs: No Transportation Needs (08/26/2023)   PRAPARE - Administrator, Civil Service (Medical): No    Lack of Transportation (Non-Medical): No  Physical Activity: Not on file  Stress: Not on file  Social Connections: Socially Integrated (08/26/2023)   Social Connection and Isolation Panel [NHANES]    Frequency of Communication with Friends and Family: More than three times a week    Frequency of Social Gatherings with Friends and Family: Three times a week    Attends Religious Services: More than 4 times per year    Active Member of Clubs or Organizations: No    Attends Banker Meetings: 1 to 4 times per year    Marital Status: Married  Catering manager Violence: Not At Risk (08/26/2023)   Humiliation, Afraid, Rape, and Kick questionnaire    Fear of Current or Ex-Partner: No    Emotionally Abused: No    Physically Abused: No    Sexually Abused: No    Family History  Problem Relation Age of Onset   Breast cancer Mother 12   Breast cancer Sister 38     Vitals:   08/27/23 0900 08/27/23 1000  08/27/23 1100 08/27/23 1207  BP: (!) 137/54 (!) 140/59 (!) 147/63 (!) 165/65  Pulse: (!) 59 (!) 58 (!) 58 60  Resp: 19 18 19 18   Temp:    98.8 F (37.1 C)  TempSrc:    Oral  SpO2: 98% 100% 98% 97%  Weight:      Height:        PHYSICAL EXAM General: Well-appearing elderly female, well nourished, in no acute distress. HEENT: Normocephalic and atraumatic. Neck: No JVD.  Lungs: Normal respiratory effort on room air. Clear bilaterally to auscultation. No wheezes,  crackles, rhonchi.  Heart: V-paced. Normal S1 and S2 without gallops or murmurs.  Abdomen: Non-distended appearing.  Msk: Normal strength and tone for age. Extremities: Warm and well perfused. No clubbing, cyanosis.  No edema.  Neuro: Alert and oriented X 3. Psych: Answers questions appropriately.   Labs: Basic Metabolic Panel: Recent Labs    08/26/23 1554 08/26/23 1935 08/27/23 0414  NA 135  --  137  K 4.8  --  3.6  CL 100  --  106  CO2 22  --  22  GLUCOSE 294*  --  241*  BUN 31*  --  32*  CREATININE 1.32*  --  0.91  CALCIUM 9.1  --  8.6*  MG 2.3  --  2.1  PHOS  --  3.9 3.1   Liver Function Tests: Recent Labs    08/26/23 1554  AST 26  ALT 21  ALKPHOS 48  BILITOT 1.1  PROT 6.6  ALBUMIN 3.9   No results for input(s): "LIPASE", "AMYLASE" in the last 72 hours. CBC: Recent Labs    08/26/23 1554 08/27/23 0414  WBC 10.9* 8.1  NEUTROABS 9.1*  --   HGB 12.0 11.0*  HCT 36.5 32.5*  MCV 97.9 95.6  PLT 213 179   Cardiac Enzymes: Recent Labs    08/26/23 1554 08/26/23 1935  TROPONINIHS 25* 45*   BNP: Recent Labs    08/26/23 1554  BNP 710.2*   D-Dimer: No results for input(s): "DDIMER" in the last 72 hours. Hemoglobin A1C: Recent Labs    08/26/23 1935  HGBA1C 5.4   Fasting Lipid Panel: No results for input(s): "CHOL", "HDL", "LDLCALC", "TRIG", "CHOLHDL", "LDLDIRECT" in the last 72 hours. Thyroid  Function Tests: Recent Labs    08/26/23 1935  TSH 1.006   Anemia Panel: No results for  input(s): "VITAMINB12", "FOLATE", "FERRITIN", "TIBC", "IRON", "RETICCTPCT" in the last 72 hours.   Radiology: ECHOCARDIOGRAM COMPLETE Result Date: 08/27/2023    ECHOCARDIOGRAM REPORT   Patient Name:   Alejandra Lowe Date of Exam: 08/27/2023 Medical Rec #:  [id number]      Height:       67.0 in Accession #:    [id number]     Weight:       162.9 lb Date of Birth:  August 04, 1945     BSA:          1.853 m Patient Age:    77 years       BP:           137/54 mmHg Patient Gender: F              HR:           59 bpm. Exam Location:  [facility] Procedure: 2D Echo, Cardiac Doppler and Color Doppler (Both Spectral and Color            Flow Doppler were utilized during procedure). Indications:     Heart block, complete I44.2  History:         Patient has no prior history of Echocardiogram examinations.                  Risk Factors:Diabetes, Hypertension and Dyslipidemia.  Sonographer:     Broadus Canes Referring Phys:  [id number] DAVID W HARDING Diagnosing Phys: Isabell Manzanilla  Sonographer Comments: Suboptimal apical window. IMPRESSIONS  1. Left ventricular ejection fraction, by estimation, is 60 to 65%. The left ventricle has normal function. The left ventricle has no regional wall motion abnormalities. Left ventricular diastolic parameters  were normal.  2. Right ventricular systolic function is normal. The right ventricular size is normal.  3. The mitral valve is normal in structure. No evidence of mitral valve regurgitation. No evidence of mitral stenosis.  4. The aortic valve is normal in structure. Aortic valve regurgitation is not visualized. Aortic valve sclerosis/calcification is present, without any evidence of aortic stenosis.  5. The inferior vena cava is normal in size with greater than 50% respiratory variability, suggesting right atrial pressure of 3 mmHg. FINDINGS  Left Ventricle: Left ventricular ejection fraction, by estimation, is 60 to 65%. The left ventricle has normal function. The left ventricle has no regional wall  motion abnormalities. The left ventricular internal cavity size was normal in size. There is  no left ventricular hypertrophy. Left ventricular diastolic parameters were normal. Right Ventricle: The right ventricular size is normal. No increase in right ventricular wall thickness. Right ventricular systolic function is normal. Left Atrium: Left atrial size was normal in size. Right Atrium: Right atrial size was normal in size. Pericardium: There is no evidence of pericardial effusion. Mitral Valve: The mitral valve is normal in structure. No evidence of mitral valve regurgitation. No evidence of mitral valve stenosis. MV peak gradient, 5.8 mmHg. The mean mitral valve gradient is 2.0 mmHg. Tricuspid Valve: The tricuspid valve is normal in structure. Tricuspid valve regurgitation is trivial. Aortic Valve: The aortic valve is normal in structure. Aortic valve regurgitation is not visualized. Aortic valve sclerosis/calcification is present, without any evidence of aortic stenosis. Aortic valve mean gradient measures 3.0 mmHg. Aortic valve peak  gradient measures 5.8 mmHg. Aortic valve area, by VTI measures 2.27 cm. Pulmonic Valve: The pulmonic valve was normal in structure. Pulmonic valve regurgitation is not visualized. Aorta: The aortic root is normal in size and structure. Venous: The inferior vena cava is normal in size with greater than 50% respiratory variability, suggesting right atrial pressure of 3 mmHg. IAS/Shunts: No atrial level shunt detected by color flow Doppler.  LEFT VENTRICLE PLAX 2D LVIDd:         4.20 cm   Diastology LVIDs:         3.00 cm   LV e' medial:    5.33 cm/s LV PW:         0.90 cm   LV E/e' medial:  14.2 LV IVS:        0.90 cm   LV e' lateral:   6.85 cm/s LVOT diam:     2.10 cm   LV E/e' lateral: 11.0 LV SV:         56 LV SV Index:   30 LVOT Area:     3.46 cm  LEFT ATRIUM             Index        RIGHT ATRIUM           Index LA diam:        2.90 cm 1.56 cm/m   RA Area:     13.20 cm LA  Vol (A2C):   22.2 ml 11.98 ml/m  RA Volume:   34.40 ml  18.56 ml/m LA Vol (A4C):   18.5 ml 9.98 ml/m LA Biplane Vol: 20.7 ml 11.17 ml/m  AORTIC VALVE AV Area (Vmax):    2.50 cm AV Area (Vmean):   2.08 cm AV Area (VTI):     2.27 cm AV Vmax:           120.50 cm/s AV Vmean:  81.550 cm/s AV VTI:            0.248 m AV Peak Grad:      5.8 mmHg AV Mean Grad:      3.0 mmHg LVOT Vmax:         87.00 cm/s LVOT Vmean:        49.000 cm/s LVOT VTI:          0.163 m LVOT/AV VTI ratio: 0.66  AORTA Ao Root diam: 2.70 cm MITRAL VALVE                TRICUSPID VALVE MV Area (PHT): 2.02 cm     TR Peak grad:   17.6 mmHg MV Area VTI:   1.36 cm     TR Vmax:        210.00 cm/s MV Peak grad:  5.8 mmHg MV Mean grad:  2.0 mmHg     SHUNTS MV Vmax:       1.20 m/s     Systemic VTI:  0.16 m MV Vmean:      70.2 cm/s    Systemic Diam: 2.10 cm MV Decel Time: 375 msec MV E velocity: 75.50 cm/s MV A velocity: 129.00 cm/s MV E/A ratio:  0.59 Sabina Custovic Electronically signed by Isabell Manzanilla Signature Date/Time: 08/27/2023/11:13:30 AM    Final    DG Chest Port 1 View Result Date: 08/26/2023 CLINICAL DATA:  Syncope, heart block EXAM: PORTABLE CHEST 1 VIEW COMPARISON:  None FINDINGS: Single frontal view of the chest demonstrates a transvenous pacer via right internal jugular approach, tip overlying right ventricle. Cardiac silhouette is unremarkable. No airspace disease, effusion, or pneumothorax. No acute bony abnormalities. External defibrillator pads overlie the left lower chest. IMPRESSION: 1. No acute intrathoracic process. Electronically Signed   By: Bobbye Burrow M.D.   On: 08/26/2023 23:08   CARDIAC CATHETERIZATION Result Date: 08/26/2023   Successful Temporary Transvenous Pacemaker placement via right IJ access-6 French sheath.- > Catheter placed and RV-38 cm; rate 70 bpm, output 5 MA; threshold 1 MA Temporary Pacemaker Placed for stabilization to allow transport to [facility] for Permanent Pacer Placement  Plan: Temporary Admission admitted to [facility] ICU after TPM placement for stabilization (Dr. Julane Ny Accepting Physician) Patient will require transport from [facility] to [facility] CVICU to allow for EP Consultation for Permanent Pacemaker Placement Randene Bustard, MD  CT Head Wo Contrast Result Date: 08/26/2023 CLINICAL DATA:  Fall, dizziness EXAM: CT HEAD WITHOUT CONTRAST CT CERVICAL SPINE WITHOUT CONTRAST TECHNIQUE: Multidetector CT imaging of the head and cervical spine was performed following the standard protocol without intravenous contrast. Multiplanar CT image reconstructions of the cervical spine were also generated. RADIATION DOSE REDUCTION: This exam was performed according to the departmental dose-optimization program which includes automated exposure control, adjustment of the mA and/or kV according to patient size and/or use of iterative reconstruction technique. COMPARISON:  None Available. FINDINGS: CT HEAD FINDINGS Brain: No evidence of acute infarction, hemorrhage, hydrocephalus, extra-axial collection or mass lesion/mass effect. Periventricular and deep white matter hypodensity. Vascular: No hyperdense vessel or unexpected calcification. Skull: Normal. Negative for fracture or focal lesion. Sinuses/Orbits: No acute finding. Other: None. CT CERVICAL SPINE FINDINGS Alignment: Normal. Skull base and vertebrae: No acute fracture. No primary bone lesion or focal pathologic process. Soft tissues and spinal canal: No prevertebral fluid or swelling. No visible canal hematoma. Disc levels: Mild disc space height loss and osteophytosis from C5-C7 with otherwise intact disc spaces. Upper chest: Negative. Other: None. IMPRESSION: 1.  No acute intracranial pathology. Small-vessel white matter disease. 2. No fracture or static subluxation of the cervical spine. 3. Mild disc space height loss and osteophytosis from C5-C7 with otherwise intact disc spaces. Electronically Signed   By: Fredricka Jenny M.D.   On:  08/26/2023 17:15   CT Cervical Spine Wo Contrast Result Date: 08/26/2023 CLINICAL DATA:  Fall, dizziness EXAM: CT HEAD WITHOUT CONTRAST CT CERVICAL SPINE WITHOUT CONTRAST TECHNIQUE: Multidetector CT imaging of the head and cervical spine was performed following the standard protocol without intravenous contrast. Multiplanar CT image reconstructions of the cervical spine were also generated. RADIATION DOSE REDUCTION: This exam was performed according to the departmental dose-optimization program which includes automated exposure control, adjustment of the mA and/or kV according to patient size and/or use of iterative reconstruction technique. COMPARISON:  None Available. FINDINGS: CT HEAD FINDINGS Brain: No evidence of acute infarction, hemorrhage, hydrocephalus, extra-axial collection or mass lesion/mass effect. Periventricular and deep white matter hypodensity. Vascular: No hyperdense vessel or unexpected calcification. Skull: Normal. Negative for fracture or focal lesion. Sinuses/Orbits: No acute finding. Other: None. CT CERVICAL SPINE FINDINGS Alignment: Normal. Skull base and vertebrae: No acute fracture. No primary bone lesion or focal pathologic process. Soft tissues and spinal canal: No prevertebral fluid or swelling. No visible canal hematoma. Disc levels: Mild disc space height loss and osteophytosis from C5-C7 with otherwise intact disc spaces. Upper chest: Negative. Other: None. IMPRESSION: 1. No acute intracranial pathology. Small-vessel white matter disease. 2. No fracture or static subluxation of the cervical spine. 3. Mild disc space height loss and osteophytosis from C5-C7 with otherwise intact disc spaces. Electronically Signed   By: Fredricka Jenny M.D.   On: 08/26/2023 17:15    ECHO as above  TELEMETRY reviewed by me 08/27/2023: V-paced, rate 70s  EKG reviewed by me: Complete heart block  Data reviewed by me 08/27/2023: last 24h vitals tele labs imaging I/O ED provider Lowe, admission  H&P  Principal Problem:   Third degree heart block (HCC) Active Problems:   Syncope and collapse    ASSESSMENT AND PLAN:  ANAIS MCGROARTY is a 78 y.o. female  with a past medical history of hypertension, hyperlipidemia, type 1 diabetes, acute cognitive decline, recent episodes of syncope and collapse who presented to the ED on 08/26/2023 for fall and head laceration.  EKG in the ED revealed complete heart block.  Cardiology was consulted for further evaluation.   # Complete heart block # Fall # Head laceration Patient presented to the ED after a fall and head laceration.  Initial EKG in the ED revealed complete heart block with a heart rate of 29 bpm.  CBC with minimally elevated WBC at 10.9, otherwise unremarkable.  Echo this admission with pEF with no RWMA, and no significant valvular disease.  -Avoid AV nodal blocking agents. -Monitor/replenish electrolytes as indicated.  -Patient underwent temporary pacemaker with Dr. Addie Holstein yesterday. Currently VP with rate 70s. -EP following and adjusted pacemaker setting this morning for better capture. -Patient transferred for permanent pacemaker placement to [facility] this AM.   This patient's plan of care was discussed and created with Dr. Custovic and she is in agreement.  Signed: Creighton Doffing, PA-C  08/27/2023, 12:11 PM [facility]

## 2023-08-27 NOTE — Progress Notes (Signed)
*  PRELIMINARY RESULTS* Echocardiogram 2D Echocardiogram has been performed.  Alejandra Lowe 08/27/2023, 10:54 AM

## 2023-08-27 NOTE — Consult Note (Addendum)
 ELECTROPHYSIOLOGY CONSULT NOTE    Patient ID: [name] MRN: [id number], DOB/AGE: 1945-10-21 78 y.o.  Admit date: 08/27/2023 Date of Consult: 08/27/2023  Primary Physician: Jimmy Moulding, MD Primary Cardiologist: None  Electrophysiologist: Dr. Daneil Dunker (new this admission)   Patient Profile: Alejandra Lowe is a 78 y.o. female with a history of syncope, L-sided breast Ca (lumpectopy + radiation), T1DM, HTN, mild dementia, seizure-like activity,  who is being seen today for the evaluation of CHB at the request of Dr. Addie Holstein.  HPI:  Alejandra Lowe is a 78 y.o. female with PMH as above who has had a several month history of lightheadedness and syncope. She was eval'd in Jan 2025 at [facility] ER after a syncopal episode and it was deemed likely not cardiac in nature.   She has had increasing dizziness and LH for the past few weeks. Her husband discussed with pharmacist who recommended an OTC for virtigo, which did not help. She was at a chiropractor appt yesterday for adjustment of L shoulder, and had acute episode of dizziness, LH, and LOC falling off a stool and striking the L side of her head with laceration.  She presented to [facility] ER for evaluation and was found to be in CHB with a wide escape rhythm in the 20s. Stat head and c-spine CT done to rule out traumatic pathology which were negative. Staples to L head lac. She was urgently taken to the cath lab for temp pacing wire placement.   She is now s/p R internal jugular temp wire with continued CHB.    Her husband provided most of the HPI. Per husband and patient, she performs all ADLs independently, but husband performs all IADLs d/t patient's dementia.   She is not on any AVN blocking agents, normal thyroid  labs   She denies chest pain, chest pressure, palpitations. Her main complaint is ongoing L shoulder pain and neck stiffness. Other then disappointed to have to wait to Monday for her PPM, she reports doing  well Denies any active symptoms  Labs Potassium3.6 (05/16 0414) Magnesium  2.1 (05/16 0414) Creatinine, ser  0.91 (05/16 0414) PLT  179 (05/16 0414) HGB  11.0* (05/16 0414) WBC 8.1 (05/16 0414) Troponin I (High Sensitivity)45* (05/15 1935).    Past Medical History:  Diagnosis Date   Arthritis    Basal cell carcinoma 05/27/2020   R upper back - ED&C   Breast cancer (HCC) 1997   left breast, radiation, chemo   Diabetes mellitus without complication (HCC)    Dysplastic nevus 02/24/2021   Left distal ant thigh   Glaucoma    Hyperlipidemia    Hypertension    Personal history of chemotherapy 1997   BREAST CA   Personal history of radiation therapy 1997   BREAST CA   Seizure (HCC)      Surgical History:  Past Surgical History:  Procedure Laterality Date   BREAST EXCISIONAL BIOPSY Left 1997   positive   BREAST LUMPECTOMY Left 1997   W/ radiation   ROTATOR CUFF REPAIR       Medications Prior to Admission  Medication Sig Dispense Refill Last Dose/Taking   atorvastatin (LIPITOR) 10 MG tablet Take 10 mg by mouth daily.   Past Week   calcium carbonate (OSCAL) 1500 (600 Ca) MG TABS tablet Take 600 mg of elemental calcium by mouth daily with breakfast.   Past Week   Cholecalciferol (VITAMIN D) 50 MCG (2000 UT) CAPS Take 2,000 Units by mouth  daily.   Past Week   Continuous Glucose Receiver (FREESTYLE LIBRE 14 DAY READER) DEVI 1 kit by Does not apply route as directed.   Past Week   Continuous Glucose Sensor (FREESTYLE LIBRE 14 DAY SENSOR) MISC 1 each by Does not apply route every 14 (fourteen) days.   Unknown   donepezil (ARICEPT) 5 MG tablet Take 5 mg by mouth at bedtime.   Past Week   Glucagon (GVOKE HYPOPEN 2-PACK) 1 MG/0.2ML SOAJ Inject 1 mg into the skin once as needed (allergic reaction).   Unknown   losartan (COZAAR) 25 MG tablet Take 1 tablet by mouth daily.   Past Week   meclizine (ANTIVERT) 12.5 MG tablet Take 12.5 mg by mouth 3 (three) times daily as needed for  dizziness.   Unknown   triamcinolone cream (KENALOG) 0.1 % Apply 1 Application topically 2 (two) times daily as needed (itching).   Unknown   vitamin E 180 MG (400 UNITS) capsule Take 400 Units by mouth daily.   Past Week   insulin  aspart (NOVOLOG) 100 UNIT/ML injection Inject 0-9 Units into the skin every 4 (four) hours.      insulin  aspart (NOVOLOG) 100 UNIT/ML injection Inject 0-9 Units into the skin every 4 (four) hours.      [START ON 08/28/2023] insulin  glargine-yfgn (SEMGLEE) 100 UNIT/ML injection Inject 0.14 mLs (14 Units total) into the skin daily.       Inpatient Medications:   [START ON 08/28/2023] atorvastatin  10 mg Oral Daily   Chlorhexidine Gluconate Cloth  6 each Topical Daily   insulin  aspart  0-9 Units Subcutaneous Q4H   [START ON 08/28/2023] insulin  glargine-yfgn  14 Units Subcutaneous Daily    Allergies: No Known Allergies  Family History  Problem Relation Age of Onset   Breast cancer Mother 32   Breast cancer Sister 21     Physical Exam: Vitals:   08/27/23 1600 08/27/23 1615 08/27/23 1620 08/27/23 1630  BP: (!) 150/64 135/61  (!) 154/62  Pulse: 63 62  64  Resp: 13 13  15   Temp:   98.1 F (36.7 C)   TempSrc:   Oral   SpO2: 98% 99%  99%    GEN- NAD, A&O x 3, normal affect HEENT: Normocephalic, atraumatic Right internal jugular is CDI Lungs- CTAB, Normal effort.  Heart- Regular rate and rhythm, No M/G/R.  GI- Soft, NT, ND.  Extremities- No clubbing, cyanosis, or edema   Radiology/Studies:     DG Chest Port 1 View Result Date: 08/26/2023 CLINICAL DATA:  Syncope, heart block EXAM: PORTABLE CHEST 1 VIEW COMPARISON:  None FINDINGS: Single frontal view of the chest demonstrates a transvenous pacer via right internal jugular approach, tip overlying right ventricle. Cardiac silhouette is unremarkable. No airspace disease, effusion, or pneumothorax. No acute bony abnormalities. External defibrillator pads overlie the left lower chest. IMPRESSION: 1. No acute  intrathoracic process. Electronically Signed   By: Bobbye Burrow M.D.   On: 08/26/2023 23:08    CT Head Wo Contrast Result Date: 08/26/2023 CLINICAL DATA:  Fall, dizziness EXAM: CT HEAD WITHOUT CONTRAST CT CERVICAL SPINE WITHOUT CONTRAST TECHNIQUE: Multidetector CT imaging of the head and cervical spine was performed following the standard protocol without intravenous contrast. Multiplanar CT image reconstructions of the cervical spine were also generated. RADIATION DOSE REDUCTION: This exam was performed according to the departmental dose-optimization program which includes automated exposure control, adjustment of the mA and/or kV according to patient size and/or use of iterative reconstruction technique. COMPARISON:  None Available. FINDINGS: CT HEAD FINDINGS Brain: No evidence of acute infarction, hemorrhage, hydrocephalus, extra-axial collection or mass lesion/mass effect. Periventricular and deep white matter hypodensity. Vascular: No hyperdense vessel or unexpected calcification. Skull: Normal. Negative for fracture or focal lesion. Sinuses/Orbits: No acute finding. Other: None. CT CERVICAL SPINE FINDINGS Alignment: Normal. Skull base and vertebrae: No acute fracture. No primary bone lesion or focal pathologic process. Soft tissues and spinal canal: No prevertebral fluid or swelling. No visible canal hematoma. Disc levels: Mild disc space height loss and osteophytosis from C5-C7 with otherwise intact disc spaces. Upper chest: Negative. Other: None. IMPRESSION: 1. No acute intracranial pathology. Small-vessel white matter disease. 2. No fracture or static subluxation of the cervical spine. 3. Mild disc space height loss and osteophytosis from C5-C7 with otherwise intact disc spaces. Electronically Signed   By: Fredricka Jenny M.D.   On: 08/26/2023 17:15    EKG: 08/26/2023 - CHB with RBBB escape  05/04/2023 - SR with 1st deg HB, LBBB; rate 63 PR 286; QRS 124   (personally reviewed)  TELEMETRY: SR  70's currently (personally reviewed)  08/27/23: TTE  1. Left ventricular ejection fraction, by estimation, is 60 to 65%. The  left ventricle has normal function. The left ventricle has no regional  wall motion abnormalities. Left ventricular diastolic parameters were  normal.   2. Right ventricular systolic function is normal. The right ventricular  size is normal.   3. The mitral valve is normal in structure. No evidence of mitral valve  regurgitation. No evidence of mitral stenosis.   4. The aortic valve is normal in structure. Aortic valve regurgitation is  not visualized. Aortic valve sclerosis/calcification is present, without  any evidence of aortic stenosis.   5. The inferior vena cava is normal in size with greater than 50%  respiratory variability, suggesting right atrial pressure of 3 mmHg.      Assessment/Plan: #) CHB with RBBB escape #) syncope, LOC with L head lac #) L breast Ca with radiation  S/p R internal jugular temp wire No AVN blocking medications (not Aricept, though has been helpful) Thyroid  labs stable  She has conduction currently though with no reversible causes or intervention to have helped her AV conduction, not felt reliable and recommend PPM Discussed with patient and husband bedside EP labs are still running and will not be able to get done today, they are aware She is on for Monday  Dr.Matilda Fleig has seen the patient   #) dementia She is pleasant and cooperative Asks appropriate questions   #) T1DM Per primary team  #) HTN Stable, no changes needed at this time PRN hydralazine is available  For questions or updates, please contact [facility] Please consult [email or web address] for contact info under Cardiology/STEMI.  Signed, Debbie Fails, PA-C  08/27/2023 4:46 PM   I have seen, examined the patient, and reviewed the above assessment and plan.    HPI: Ms. Maureena Garofolo is a 78 year old female with a past medical history notable for  syncope, L-sided breast Ca (lumpectopy + radiation), T1DM, HTN, mild dementia, seizure-like activity who presented to the [facility] ED on 5/15 after a syncopal event. She has had 4 similar episodes in the past month. EKG in the ED demonstrated complete heart block. Temporary pacing wire was implanted. Patient transferred to [facility] for evaluation for permanent pacemaker. Currently, she reports feeling improved. No new or acute complaints.   General: Well developed, in no acute distress.  Neck: No JVD. RIJ  temporary  pacing wire.   Cardiac: Normal rate, regular rhythm.  Resp: Normal work of breathing.  Ext: No edema.  Neuro: No gross focal deficits.  Psych: Normal affect.   Echo 08/27/23:   1. Left ventricular ejection fraction, by estimation, is 60 to 65%. The  left ventricle has normal function. The left ventricle has no regional  wall motion abnormalities. Left ventricular diastolic parameters were  normal.   2. Right ventricular systolic function is normal. The right ventricular  size is normal.   3. The mitral valve is normal in structure. No evidence of mitral valve  regurgitation. No evidence of mitral stenosis.   4. The aortic valve is normal in structure. Aortic valve regurgitation is  not visualized. Aortic valve sclerosis/calcification is present, without  any evidence of aortic stenosis.   5. The inferior vena cava is normal in size with greater than 50%  respiratory variability, suggesting right atrial pressure of 3 mmHg.   EKG 08/26/23:    Assessment:  MEDORA HOLIHAN is a 78 y.o. female with a history of L-sided breast Ca (lumpectopy + radiation), T1DM, HTN, mild dementia, seizure-like activity who presented to the ED after a syncopal event and was found to be in complete heart block.  Patient has baseline conduction disease.  EKG from January 2025 shows left bundle branch block with long first-degree AV delay (PR 286 ms).  Now appears to have progressed to intermittent  complete heart block.  There are no obviously reversible causes.  She does take Aricept for her dementia, but in the background of underlying conduction disease I do not think stopping this medication alone would be safe enough to prevent her from permanent pacemaker implant.  Plan:  #. Intermittent complete heart block: - Keep temporary pacing wire in place. - Tentatively planning for right sided dual-chamber permanent pacemaker implant on Monday.  NPO. after midnight on Sunday.  Ardeen Kohler, MD 08/27/2023 8:52 PM

## 2023-08-27 NOTE — Progress Notes (Signed)
 NAME:  Alejandra Lowe, MRN:  [id number], DOB:  [birth year], LOS: 1 ADMISSION DATE:  08/26/2023, CONSULTATION DATE: 08/26/2023 REFERRING MD: Dr. Karlynn Oyster, CHIEF COMPLAINT: Syncopal Episode    History of Present Illness:  This is a 78 yo female who presented to [facility] ER on 05/15 following a syncopal episode resulting in a fall.  Pt reported for the past month she has had intermittent episodes of lightheadedness including 4 syncopal episodes with fall.  However, today she hit the left side of her head resulting in loss of consciousness and noted to have a small laceration to the left side of her head.  She denies taking blood thinners.  Upon reviewing outpatient medication list she does not take beta-blockers or antiarrhythmic medications.  Although pts husband reports pts pharmacist did inform him some of the pts otc medications for memory can cause bradycardia   ED Course Upon arrival to the ER EKG revealed a third degree AV block with junctional rhythm; incomplete RBBB; no acute ST elevation or depression. EDP applied 2 staples to left side of scalp due to laceration.  Cardiologist Dr. Addie Holstein consulted by EDP and pt taken emergently to cardiac cath lab for temporary venous pacemaker placement.  PCCM team contacted for ICU admission pending transfer to [facility] for potential need for permanent pacemaker placement accepting physician Dr. Julane Ny.     Significant lab results: glucose 294/BUN 31/creatinine 1.32/BNP 710.2/troponin 25/wbc 10.9  CT Head/Cervical Spine: No acute intracranial pathology. Small-vessel white matter disease. No fracture or static subluxation of the cervical spine. Mild disc space height loss and osteophytosis from C5-C7 with otherwise intact disc spaces.  Pertinent  Medical History  Arthritis  Basal Cell Carcinoma Left-Sided Breast Cancer (underwent radiation and chemotherapy) Type II Diabetes Mellitus  Glaucoma HLD HTN  Seizures due to hypoglycemic episodes   Type I Diabetes Mellitus   Significant Hospital Events: Including procedures, antibiotic start and stop dates in addition to other pertinent events   05/15: Pt admitted with complete heart block s/p right internal jugular temporary transvenous pacemaker placement pending transfer to [facility] for possible need for permanent pacemaker placement  05/16: No significant events overnight.  Continues with temporary TV pacemaker, hemodynamically stable.  Awaiting transfer to [facility] for Permanent Pacemaker placement.  Interim History / Subjective:  Pt awake in no distress c/o chronic left shoulder and back pain  Objective    Blood pressure (!) 129/108, pulse 69, temperature 98 F (36.7 C), temperature source Oral, resp. rate 14, height 5\' 7"  (1.702 m), weight 73.9 kg, SpO2 96%.       No intake or output data in the 24 hours ending 08/27/23 0732 Filed Weights   08/26/23 1559 08/26/23 1827  Weight: 63.5 kg 73.9 kg    Examination: General: Acutely-ill appearing female, NAD on RA  HENT: supple, no JVD  Lungs: Clear throughout, even, non labored  Cardiovascular: Paced rhythm, no m/r/g, 2+ radial/1+ distal pulses, no edema; right internal jugular transvenous pacemaker dressing dry/intact  Abdomen: +BS x4, soft, non tender, non distended  Extremities: Normal bulk and tone, moves all extremities  Skin: left head laceration with 2 staples present, left shin abrasion dressing dry/intact present on admission, chronic vascular discoloration bilateral lower extremities  Neuro: Awake and Alert, oriented x2, following commands, no focal deficits, PERRLA  GU: Deferred   Resolved problem list   Assessment and Plan   #Dementia  - Maintain sleep/wake cycle - Frequent reorientation  - Provide supportive care   #Complete  heart block s/p temporary venous pacemaker placement  #Syncope and fall resulting left-sided head laceration  #Mildly elevated troponin suspect secondary to demand ischemia   Hx: HTN and HLD   -Continuous cardiac monitoring -Maintain MAP >65 -Vasopressors as needed to maintain MAP goal ~ Not requiring -BNP 710 ~ Echocardiogram pending -TSH is normal at 1 -Avoid AV nodal blocking agents  -Replace electrolytes as indicated  -Fall precautions  -Once able to take po's resume outpatient atorvastatin  -Pending transfer to [facility] for evaluation and need for permanent pacemaker placement by EP   #Type I diabetes mellitus at baseline uses insulin  pump - CBG's q4hrs  - Sensitive SSI  - Follow hyper/hypoglycemic protocol  - Instructed pts husband to take insulin  pump home for now  - Diabetes coordinator consulted appreciate input    Best Practice (right click and "Reselect all SmartList Selections" daily)   Diet/type: NPO DVT prophylaxis SCD Pressure ulcer(s): N/A GI prophylaxis: N/A Lines: Right internal jugular temporary transvenous pacemaker placement  Foley:  N/A Code Status:  full code Last date of multidisciplinary goals of care discussion [5/16]  5/16: Pt updated at bedside on plan of care.  Labs   CBC: Recent Labs  Lab 08/26/23 1554 08/27/23 0414  WBC 10.9* 8.1  NEUTROABS 9.1*  --   HGB 12.0 11.0*  HCT 36.5 32.5*  MCV 97.9 95.6  PLT 213 179    Basic Metabolic Panel: Recent Labs  Lab 08/26/23 1554 08/26/23 1935 08/27/23 0414  NA 135  --  137  K 4.8  --  3.6  CL 100  --  106  CO2 22  --  22  GLUCOSE 294*  --  241*  BUN 31*  --  32*  CREATININE 1.32*  --  0.91  CALCIUM 9.1  --  8.6*  MG 2.3  --  2.1  PHOS  --  3.9 3.1   GFR: Estimated Creatinine Clearance: 50.3 mL/min (by C-G formula based on SCr of 0.91 mg/dL). Recent Labs  Lab 08/26/23 1554 08/27/23 0414  WBC 10.9* 8.1    Liver Function Tests: Recent Labs  Lab 08/26/23 1554  AST 26  ALT 21  ALKPHOS 48  BILITOT 1.1  PROT 6.6  ALBUMIN 3.9   No results for input(s): "LIPASE", "AMYLASE" in the last 168 hours. No results for input(s): "AMMONIA" in  the last 168 hours.  ABG No results found for: "PHART", "PCO2ART", "PO2ART", "HCO3", "TCO2", "ACIDBASEDEF", "O2SAT"   Coagulation Profile: No results for input(s): "INR", "PROTIME" in the last 168 hours.  Cardiac Enzymes: No results for input(s): "CKTOTAL", "CKMB", "CKMBINDEX", "TROPONINI" in the last 168 hours.  HbA1C: Hgb A1c MFr Bld  Date/Time Value Ref Range Status  08/26/2023 07:35 PM 5.4 4.8 - 5.6 % Final    Comment:    (NOTE) Pre diabetes:          5.7%-6.4%  Diabetes:              >6.4%  Glycemic control for   <7.0% adults with diabetes     CBG: Recent Labs  Lab 08/26/23 1932 08/26/23 2232 08/27/23 0045 08/27/23 0358 08/27/23 0430  GLUCAP 311* 309* 125* 47* 235*    Review of Systems: Positives in BOLD   Gen: Denies fever, chills, weight change, fatigue, night sweats HEENT: Denies blurred vision, double vision, hearing loss, tinnitus, sinus congestion, rhinorrhea, sore throat, neck stiffness, dysphagia PULM: Denies shortness of breath, cough, sputum production, hemoptysis, wheezing CV: chest pain, edema, orthopnea, paroxysmal  nocturnal dyspnea, palpitations GI: Denies abdominal pain, nausea, vomiting, diarrhea, hematochezia, melena, constipation, change in bowel habits GU: Denies dysuria, hematuria, polyuria, oliguria, urethral discharge Endocrine: Denies hot or cold intolerance, polyuria, polyphagia or appetite change Derm: Denies rash, dry skin, scaling or peeling skin change Heme: Denies easy bruising, bleeding, bleeding gums Neuro: syncope and fall resulting in left-sided head laceration, headache, numbness, weakness, slurred speech, loss of memory or consciousness  Past Medical History:  She,  has a past medical history of Arthritis, Basal cell carcinoma (05/27/2020), Breast cancer (HCC) (1997), Diabetes mellitus without complication (HCC), Dysplastic nevus (02/24/2021), Glaucoma, Hyperlipidemia, Hypertension, Personal history of chemotherapy (1997),  Personal history of radiation therapy (1997), and Seizure (HCC).   Surgical History:   Past Surgical History:  Procedure Laterality Date   BREAST EXCISIONAL BIOPSY Left 1997   positive   BREAST LUMPECTOMY Left 1997   W/ radiation   ROTATOR CUFF REPAIR       Social History:   reports that she has never smoked. She has never used smokeless tobacco. She reports current alcohol use of about 7.0 standard drinks of alcohol per week. She reports that she does not use drugs.   Family History:  Her family history includes Breast cancer (age of onset: 48) in her sister; Breast cancer (age of onset: 29) in her mother.   Allergies No Known Allergies   Home Medications  Prior to Admission medications   Medication Sig Start Date End Date Taking? Authorizing Provider  atorvastatin (LIPITOR) 10 MG tablet  11/01/17   [provider]  calcium carbonate (OS-CAL) 600 MG TABS tablet Take 600 mg by mouth daily with breakfast.    [provider]  conjugated estrogens  (PREMARIN ) vaginal cream Apply a pea-sized amount around the opening to the urethra 3 times weekly. 02/28/20   Vaillancourt, Samantha, PA-C  Continuous Blood Gluc Receiver (FREESTYLE LIBRE 14 DAY READER) DEVI Use 1 kit as directed E10.649 07/27/17   [provider]  Continuous Blood Gluc Sensor (FREESTYLE LIBRE 14 DAY SENSOR) MISC USE 1 EACH EVERY 14 (FOURTEEN) DAYS E10.649 12/08/17   [provider]  glucagon 1 MG injection Take as directed 07/27/17   [provider]  insulin  aspart (NOVOLOG) 100 UNIT/ML injection USE UP TO 40 UNITS PER DAY IN THE ACCUCHEK PUMP AS DIRECTED. 04/20/16   [provider]  nitrofurantoin , macrocrystal-monohydrate, (MACROBID ) 100 MG capsule Take 1 capsule (100 mg total) by mouth daily. 12/25/21   Vaillancourt, Samantha, PA-C  omega-3 acid ethyl esters (LOVAZA) 1 g capsule Take 1 g by mouth daily.    [provider]  phenazopyridine  (PYRIDIUM ) 200 MG tablet  Take 1 tablet (200 mg total) by mouth 3 (three) times daily as needed for pain. Do not take for longer than 2 days continuously. 12/25/21   Vaillancourt, Samantha, PA-C  vitamin E 400 UNIT capsule Take 400 Units by mouth daily.    [provider]     Critical care time: 40 minutes     Cherylann Corpus, AGACNP-BC [facility] Prefer epic messenger for cross cover needs If after hours, please call E-link

## 2023-08-27 NOTE — H&P (Addendum)
 Advanced Heart Failure Team History and Physical Note   PCP:  Jimmy Moulding, MD  PCP-Cardiology: None     Reason for Admission: Syncope> CHB  HPI:   Alejandra Lowe is a 78 y.o. female with L sided breast cancer (s/p lumpectomy and radiation), HTN, DM I, mild dementia and HLD.  She initially presented to [facility] for syncope and collapse. She had been experiencing dizziness for several weeks. Of note she was seen at [facility] ED 1/25 with dizziness and unsteadiness. Full workup was incomplete as patient left d/t wait times. This time she was sitting at their counter when she fell over and hit her head. Work up in the ED showed CHB on EKG with rate at 29 bpm. She was taken to the cath lab for temp TVP. L sided head lac, staples were placed. Echo with EF 60-65%, LV with no RWMA. RV normal. K 3.6, Mg 2.1.   Arrived to unit in no acute distress. Resting comfortably in bed. Paced at 60. Denies CP/SOB.   Home Medications Prior to Admission medications   Medication Sig Start Date End Date Taking? Authorizing Provider  insulin  aspart (NOVOLOG) 100 UNIT/ML injection Inject 0-9 Units into the skin every 4 (four) hours. 08/26/23   Nelson, Dana G, NP  insulin  aspart (NOVOLOG) 100 UNIT/ML injection Inject 0-9 Units into the skin every 4 (four) hours. 08/27/23   Keene, Jeremiah D, NP  insulin  glargine-yfgn (SEMGLEE) 100 UNIT/ML injection Inject 0.14 mLs (14 Units total) into the skin daily. 08/28/23   Delanna Fears, NP    Past Medical History: Past Medical History:  Diagnosis Date   Arthritis    Basal cell carcinoma 05/27/2020   R upper back - ED&C   Breast cancer (HCC) 1997   left breast, radiation, chemo   Diabetes mellitus without complication (HCC)    Dysplastic nevus 02/24/2021   Left distal ant thigh   Glaucoma    Hyperlipidemia    Hypertension    Personal history of chemotherapy 1997   BREAST CA   Personal history of radiation therapy 1997   BREAST CA   Seizure (HCC)     Past  Surgical History: Past Surgical History:  Procedure Laterality Date   BREAST EXCISIONAL BIOPSY Left 1997   positive   BREAST LUMPECTOMY Left 1997   W/ radiation   ROTATOR CUFF REPAIR      Family History:  Family History  Problem Relation Age of Onset   Breast cancer Mother 99   Breast cancer Sister 13    Social History: Social History   Socioeconomic History   Marital status: Married    Spouse name: Not on file   Number of children: Not on file   Years of education: Not on file   Highest education level: Not on file  Occupational History   Not on file  Tobacco Use   Smoking status: Never   Smokeless tobacco: Never  Vaping Use   Vaping status: Never Used  Substance and Sexual Activity   Alcohol use: Yes    Alcohol/week: 7.0 standard drinks of alcohol    Types: 7 Glasses of wine per week   Drug use: Never   Sexual activity: Yes    Birth control/protection: Post-menopausal  Other Topics Concern   Not on file  Social History Narrative   Not on file   Social Drivers of Health   Financial Resource Strain: Low Risk  (08/19/2023)   Received from [facility]  Overall Financial Resource Strain (CARDIA)    Difficulty of Paying Living Expenses: Not hard at all  Food Insecurity: No Food Insecurity (08/26/2023)   Hunger Vital Sign    Worried About Running Out of Food in the Last Year: Never true    Ran Out of Food in the Last Year: Never true  Transportation Needs: No Transportation Needs (08/26/2023)   PRAPARE - Administrator, Civil Service (Medical): No    Lack of Transportation (Non-Medical): No  Physical Activity: Not on file  Stress: Not on file  Social Connections: Socially Integrated (08/26/2023)   Social Connection and Isolation Panel [NHANES]    Frequency of Communication with Friends and Family: More than three times a week    Frequency of Social Gatherings with Friends and Family: Three times a week    Attends Religious Services:  More than 4 times per year    Active Member of Clubs or Organizations: No    Attends Banker Meetings: 1 to 4 times per year    Marital Status: Married    Allergies:  No Known Allergies  Objective:    Vital Signs:   Temp:  [98 F (36.7 C)-98.8 F (37.1 C)] 98.8 F (37.1 C) (05/16 1207) Pulse Rate:  [0-76] 60 (05/16 1207) Resp:  [12-20] 18 (05/16 1207) BP: (120-166)/(40-108) 165/65 (05/16 1207) SpO2:  [94 %-100 %] 97 % (05/16 1207) Weight:  [63.5 kg-73.9 kg] 73.9 kg (05/15 1827)   There were no vitals filed for this visit.   Physical Exam     General:  elderly appearing.  No respiratory difficulty Neck: supple. JVD ~6 cm. Temp TVP R IJ Cor: PMI nondisplaced. Regular rate & rhythm. No rubs, gallops or murmurs. Lungs: clear Extremities: no cyanosis, clubbing, rash, edema  Neuro: alert & oriented x 3 but forgetful. Moves all 4 extremities w/o difficulty. Affect pleasant.   Telemetry   Paced 70 bpm (Personally reviewed)    EKG   CHB 29 bpm   Labs     Basic Metabolic Panel: Recent Labs  Lab 08/26/23 1554 08/26/23 1935 08/27/23 0414  NA 135  --  137  K 4.8  --  3.6  CL 100  --  106  CO2 22  --  22  GLUCOSE 294*  --  241*  BUN 31*  --  32*  CREATININE 1.32*  --  0.91  CALCIUM 9.1  --  8.6*  MG 2.3  --  2.1  PHOS  --  3.9 3.1    Liver Function Tests: Recent Labs  Lab 08/26/23 1554  AST 26  ALT 21  ALKPHOS 48  BILITOT 1.1  PROT 6.6  ALBUMIN 3.9   No results for input(s): "LIPASE", "AMYLASE" in the last 168 hours. No results for input(s): "AMMONIA" in the last 168 hours.  CBC: Recent Labs  Lab 08/26/23 1554 08/27/23 0414  WBC 10.9* 8.1  NEUTROABS 9.1*  --   HGB 12.0 11.0*  HCT 36.5 32.5*  MCV 97.9 95.6  PLT 213 179    Cardiac Enzymes: No results for input(s): "CKTOTAL", "CKMB", "CKMBINDEX", "TROPONINI" in the last 168 hours.  BNP: BNP (last 3 results) Recent Labs    08/26/23 1554  BNP 710.2*    ProBNP (last 3  results) No results for input(s): "PROBNP" in the last 8760 hours.   CBG: Recent Labs  Lab 08/27/23 0045 08/27/23 0358 08/27/23 0430 08/27/23 0805 08/27/23 1156  GLUCAP 125* 47* 235* 196* 274*  Coagulation Studies: No results for input(s): "LABPROT", "INR" in the last 72 hours.  Imaging: ECHOCARDIOGRAM COMPLETE Result Date: 08/27/2023    ECHOCARDIOGRAM REPORT   Patient Name:   CORAYMA MCCROSSEN Date of Exam: 08/27/2023 Medical Rec #:  [id number]      Height:       67.0 in Accession #:    [id number]     Weight:       162.9 lb Date of Birth:  [birth year]     BSA:          1.853 m Patient Age:    77 years       BP:           137/54 mmHg Patient Gender: F              HR:           59 bpm. Exam Location:  [facility] Procedure: 2D Echo, Cardiac Doppler and Color Doppler (Both Spectral and Color            Flow Doppler were utilized during procedure). Indications:     Heart block, complete I44.2  History:         Patient has no prior history of Echocardiogram examinations.                  Risk Factors:Diabetes, Hypertension and Dyslipidemia.  Sonographer:     Broadus Canes Referring Phys:  [id number] DAVID W HARDING Diagnosing Phys: Isabell Manzanilla  Sonographer Comments: Suboptimal apical window. IMPRESSIONS  1. Left ventricular ejection fraction, by estimation, is 60 to 65%. The left ventricle has normal function. The left ventricle has no regional wall motion abnormalities. Left ventricular diastolic parameters were normal.  2. Right ventricular systolic function is normal. The right ventricular size is normal.  3. The mitral valve is normal in structure. No evidence of mitral valve regurgitation. No evidence of mitral stenosis.  4. The aortic valve is normal in structure. Aortic valve regurgitation is not visualized. Aortic valve sclerosis/calcification is present, without any evidence of aortic stenosis.  5. The inferior vena cava is normal in size with greater than 50% respiratory variability, suggesting right  atrial pressure of 3 mmHg. FINDINGS  Left Ventricle: Left ventricular ejection fraction, by estimation, is 60 to 65%. The left ventricle has normal function. The left ventricle has no regional wall motion abnormalities. The left ventricular internal cavity size was normal in size. There is  no left ventricular hypertrophy. Left ventricular diastolic parameters were normal. Right Ventricle: The right ventricular size is normal. No increase in right ventricular wall thickness. Right ventricular systolic function is normal. Left Atrium: Left atrial size was normal in size. Right Atrium: Right atrial size was normal in size. Pericardium: There is no evidence of pericardial effusion. Mitral Valve: The mitral valve is normal in structure. No evidence of mitral valve regurgitation. No evidence of mitral valve stenosis. MV peak gradient, 5.8 mmHg. The mean mitral valve gradient is 2.0 mmHg. Tricuspid Valve: The tricuspid valve is normal in structure. Tricuspid valve regurgitation is trivial. Aortic Valve: The aortic valve is normal in structure. Aortic valve regurgitation is not visualized. Aortic valve sclerosis/calcification is present, without any evidence of aortic stenosis. Aortic valve mean gradient measures 3.0 mmHg. Aortic valve peak  gradient measures 5.8 mmHg. Aortic valve area, by VTI measures 2.27 cm. Pulmonic Valve: The pulmonic valve was normal in structure. Pulmonic valve regurgitation is not visualized. Aorta: The aortic root is normal in size and structure. Venous: The  inferior vena cava is normal in size with greater than 50% respiratory variability, suggesting right atrial pressure of 3 mmHg. IAS/Shunts: No atrial level shunt detected by color flow Doppler.  LEFT VENTRICLE PLAX 2D LVIDd:         4.20 cm   Diastology LVIDs:         3.00 cm   LV e' medial:    5.33 cm/s LV PW:         0.90 cm   LV E/e' medial:  14.2 LV IVS:        0.90 cm   LV e' lateral:   6.85 cm/s LVOT diam:     2.10 cm   LV E/e'  lateral: 11.0 LV SV:         56 LV SV Index:   30 LVOT Area:     3.46 cm  LEFT ATRIUM             Index        RIGHT ATRIUM           Index LA diam:        2.90 cm 1.56 cm/m   RA Area:     13.20 cm LA Vol (A2C):   22.2 ml 11.98 ml/m  RA Volume:   34.40 ml  18.56 ml/m LA Vol (A4C):   18.5 ml 9.98 ml/m LA Biplane Vol: 20.7 ml 11.17 ml/m  AORTIC VALVE AV Area (Vmax):    2.50 cm AV Area (Vmean):   2.08 cm AV Area (VTI):     2.27 cm AV Vmax:           120.50 cm/s AV Vmean:          81.550 cm/s AV VTI:            0.248 m AV Peak Grad:      5.8 mmHg AV Mean Grad:      3.0 mmHg LVOT Vmax:         87.00 cm/s LVOT Vmean:        49.000 cm/s LVOT VTI:          0.163 m LVOT/AV VTI ratio: 0.66  AORTA Ao Root diam: 2.70 cm MITRAL VALVE                TRICUSPID VALVE MV Area (PHT): 2.02 cm     TR Peak grad:   17.6 mmHg MV Area VTI:   1.36 cm     TR Vmax:        210.00 cm/s MV Peak grad:  5.8 mmHg MV Mean grad:  2.0 mmHg     SHUNTS MV Vmax:       1.20 m/s     Systemic VTI:  0.16 m MV Vmean:      70.2 cm/s    Systemic Diam: 2.10 cm MV Decel Time: 375 msec MV E velocity: 75.50 cm/s MV A velocity: 129.00 cm/s MV E/A ratio:  0.59 Designer, multimedia signed by Isabell Manzanilla Signature Date/Time: 08/27/2023/11:13:30 AM    Final    DG Chest Port 1 View Result Date: 08/26/2023 CLINICAL DATA:  Syncope, heart block EXAM: PORTABLE CHEST 1 VIEW COMPARISON:  None FINDINGS: Single frontal view of the chest demonstrates a transvenous pacer via right internal jugular approach, tip overlying right ventricle. Cardiac silhouette is unremarkable. No airspace disease, effusion, or pneumothorax. No acute bony abnormalities. External defibrillator pads overlie the left lower chest. IMPRESSION: 1. No acute intrathoracic process. Electronically Signed  By: Bobbye Burrow M.D.   On: 08/26/2023 23:08   CARDIAC CATHETERIZATION Result Date: 08/26/2023   Successful Temporary Transvenous Pacemaker placement via right IJ access-6  French sheath.- > Catheter placed and RV-38 cm; rate 70 bpm, output 5 MA; threshold 1 MA Temporary Pacemaker Placed for stabilization to allow transport to [facility] for Permanent Pacer Placement Plan: Temporary Admission admitted to [facility] ICU after TPM placement for stabilization (Dr. Julane Ny Accepting Physician) Patient will require transport from [facility] to [facility] CVICU to allow for EP Consultation for Permanent Pacemaker Placement Randene Bustard, MD  CT Head Wo Contrast Result Date: 08/26/2023 CLINICAL DATA:  Fall, dizziness EXAM: CT HEAD WITHOUT CONTRAST CT CERVICAL SPINE WITHOUT CONTRAST TECHNIQUE: Multidetector CT imaging of the head and cervical spine was performed following the standard protocol without intravenous contrast. Multiplanar CT image reconstructions of the cervical spine were also generated. RADIATION DOSE REDUCTION: This exam was performed according to the departmental dose-optimization program which includes automated exposure control, adjustment of the mA and/or kV according to patient size and/or use of iterative reconstruction technique. COMPARISON:  None Available. FINDINGS: CT HEAD FINDINGS Brain: No evidence of acute infarction, hemorrhage, hydrocephalus, extra-axial collection or mass lesion/mass effect. Periventricular and deep white matter hypodensity. Vascular: No hyperdense vessel or unexpected calcification. Skull: Normal. Negative for fracture or focal lesion. Sinuses/Orbits: No acute finding. Other: None. CT CERVICAL SPINE FINDINGS Alignment: Normal. Skull base and vertebrae: No acute fracture. No primary bone lesion or focal pathologic process. Soft tissues and spinal canal: No prevertebral fluid or swelling. No visible canal hematoma. Disc levels: Mild disc space height loss and osteophytosis from C5-C7 with otherwise intact disc spaces. Upper chest: Negative. Other: None. IMPRESSION: 1. No acute intracranial pathology. Small-vessel white matter disease.  2. No fracture or static subluxation of the cervical spine. 3. Mild disc space height loss and osteophytosis from C5-C7 with otherwise intact disc spaces. Electronically Signed   By: Fredricka Jenny M.D.   On: 08/26/2023 17:15   CT Cervical Spine Wo Contrast Result Date: 08/26/2023 CLINICAL DATA:  Fall, dizziness EXAM: CT HEAD WITHOUT CONTRAST CT CERVICAL SPINE WITHOUT CONTRAST TECHNIQUE: Multidetector CT imaging of the head and cervical spine was performed following the standard protocol without intravenous contrast. Multiplanar CT image reconstructions of the cervical spine were also generated. RADIATION DOSE REDUCTION: This exam was performed according to the departmental dose-optimization program which includes automated exposure control, adjustment of the mA and/or kV according to patient size and/or use of iterative reconstruction technique. COMPARISON:  None Available. FINDINGS: CT HEAD FINDINGS Brain: No evidence of acute infarction, hemorrhage, hydrocephalus, extra-axial collection or mass lesion/mass effect. Periventricular and deep white matter hypodensity. Vascular: No hyperdense vessel or unexpected calcification. Skull: Normal. Negative for fracture or focal lesion. Sinuses/Orbits: No acute finding. Other: None. CT CERVICAL SPINE FINDINGS Alignment: Normal. Skull base and vertebrae: No acute fracture. No primary bone lesion or focal pathologic process. Soft tissues and spinal canal: No prevertebral fluid or swelling. No visible canal hematoma. Disc levels: Mild disc space height loss and osteophytosis from C5-C7 with otherwise intact disc spaces. Upper chest: Negative. Other: None. IMPRESSION: 1. No acute intracranial pathology. Small-vessel white matter disease. 2. No fracture or static subluxation of the cervical spine. 3. Mild disc space height loss and osteophytosis from C5-C7 with otherwise intact disc spaces. Electronically Signed   By: Fredricka Jenny M.D.   On: 08/26/2023 17:15     Patient  Profile   Alejandra Lowe is a  78 y.o. female with L sided cancer (s/p lumpectomy and radiation), BCC,  HTN, DM I, mild dementia and HLD. Admitted with syncope. Found to be in CHB.   Assessment/Plan   CHB - New this admission. Now s/p temp TVP. Transferred to [facility].  - Initially in CHB with wide escape rhythm in 20s - EP consulted. Plan for PPM prior to d/c.  - Electrolytes WNL. TSH WNL.  - No AV nodal blockers - Echo with EF 60-65%, LV with no RWMA. RV normal  Syncope Fall Head laceration - Suspect 2/2 #1 - Head CT with no acute findings.  - Staples to L head lac  HTN - BP elevated today - Start PRN hydralazine  DM I - DM coordinator saw. Orders placed per their recs.    Sheryl Donna, NP 08/27/2023, 12:57 PM  Advanced Heart Failure Team Pager [phone] (M-F; 7a - 5p)  Please contact [facility] for night-coverage after hours (4p -7a ) and weekends on [email or web address]   Patient seen and examined with the above-signed Advanced Practice Provider and/or Housestaff. I personally reviewed laboratory data, imaging studies and relevant notes. I independently examined the patient and formulated the important aspects of the plan. I have edited the note to reflect any of my changes or salient points. I have personally discussed the plan with the patient and/or family.  78 y/o woman with DM1, HTN, mild dementia admitted with syncope in setting of CHB.   Echo EF 60-65%  Electrolytes ok. Hstrop negative  TVP placed at [facility] and transferred here for PPM.   Now in NSR  General:  Elderly No resp difficulty HEENT: normal Neck: supple. RIJ TVP . Cor: PMI nondisplaced. Regular rate & rhythm. No rubs, gallops or murmurs. Lungs: clear Abdomen: soft, nontender, nondistended. No hepatosplenomegaly. No bruits or masses. Good bowel sounds. Extremities: no cyanosis, clubbing, rash, edema Neuro: alert conversant. Moves all 4  Syncope in setting of CHB. EF normal on echo. No evidence ACS. Labs ok.    Case d/w EP. Plan PPM on Monday  Jules Oar, MD  7:08 PM

## 2023-08-27 NOTE — Inpatient Diabetes Management (Signed)
 Inpatient Diabetes Program Recommendations  AACE/ADA: New Consensus Statement on Inpatient Glycemic Control (2015)  Target Ranges:  Prepandial:   less than 140 mg/dL      Peak postprandial:   less than 180 mg/dL (1-2 hours)      Critically ill patients:  140 - 180 mg/dL   Lab Results  Component Value Date   GLUCAP 274 (H) 08/27/2023   HGBA1C 5.4 08/26/2023    Review of Glycemic Control  Latest Reference Range & Units 08/27/23 00:45 08/27/23 03:58 08/27/23 04:30 08/27/23 08:05 08/27/23 11:56  Glucose-Capillary 70 - 99 mg/dL 409 (H) 47 (L) 811 (H) 196 (H) 274 (H)   Diabetes history: Type 1 DM Home DM Meds: Insulin  Pump with Novolog                             Freestyle Libre 2 CGM  Insulin  pump settings:  Basal Rate at MN lowered to 0.5 New Total Basal: 14.4 units Medtronic 630G pump (in 2023 I tried to start her on the T slim but she could never figure out how to work it despite meeting with the trainer twice. She returned it):  Basal rates Midnight = 0.5 12 PM = 0.7 TDD basal: 14.4 units   Bolus settings I/C: 10 ISF: 30 Target Glucose: 80-120 Active insulin  time: 4 hours    Inpatient Diabetes Program Recommendations:    Note patient transferred from [facility]. She has history of Type 1 DM and uses insulin  pump at home.   She has received Semglee 14 units today at 1021a.  Consider resuming Semglee 14 units daily tomorrow morning.  Also please add Novolog 0-9 units q 4 hours.  Once eating, will also need Novolog meal coverage 3-4 units tid with meals (hold if patient eats less than 50% or NPO).    Thanks,  Josefa Ni, RN, BC-ADM Inpatient Diabetes Coordinator Pager [phone]  (8a-5p)

## 2023-08-28 LAB — THYROID PANEL WITH TSH
Free Thyroxine Index: 2.3 (ref 1.2–4.9)
T3 Uptake Ratio: 30 % (ref 24–39)
T4, Total: 7.5 ug/dL (ref 4.5–12.0)
TSH: 1.1 u[IU]/mL (ref 0.450–4.500)

## 2023-08-28 LAB — GLUCOSE, CAPILLARY
Glucose-Capillary: 234 mg/dL — ABNORMAL HIGH (ref 70–99)
Glucose-Capillary: 300 mg/dL — ABNORMAL HIGH (ref 70–99)
Glucose-Capillary: 301 mg/dL — ABNORMAL HIGH (ref 70–99)
Glucose-Capillary: 163 mg/dL — ABNORMAL HIGH (ref 70–99)
Glucose-Capillary: 149 mg/dL — ABNORMAL HIGH (ref 70–99)

## 2023-08-28 LAB — T3, FREE: T3, Free: 2.1 pg/mL (ref 2.0–4.4)

## 2023-08-28 MED ORDER — TRAMADOL HCL 50 MG PO TABS
50.0000 mg | ORAL_TABLET | Freq: Two times a day (BID) | ORAL | Status: DC | PRN
  Administered 2023-08-29 – 2023-08-30 (×2): 50 mg via ORAL
  Filled 2023-08-28 (×2): qty 1

## 2023-08-28 MED ORDER — ONDANSETRON HCL 4 MG/2ML IJ SOLN
INTRAMUSCULAR | Status: AC
Start: 2023-08-28 — End: 2023-08-29
  Administered 2023-08-29: 4 mg via INTRAVENOUS
  Filled 2023-08-28: qty 2

## 2023-08-28 MED ORDER — OXYCODONE HCL 5 MG PO TABS
5.0000 mg | ORAL_TABLET | Freq: Four times a day (QID) | ORAL | Status: DC | PRN
  Administered 2023-08-28 (×2): 5 mg via ORAL
  Filled 2023-08-28 (×2): qty 1

## 2023-08-28 MED ORDER — INSULIN ASPART 100 UNIT/ML IJ SOLN: 10.0000 [IU] | Freq: Once | INTRAMUSCULAR | Status: DC

## 2023-08-28 MED ORDER — ACETAMINOPHEN 325 MG PO TABS
650.0000 mg | ORAL_TABLET | Freq: Four times a day (QID) | ORAL | Status: DC | PRN
  Administered 2023-08-28: 650 mg via ORAL
  Filled 2023-08-28: qty 2

## 2023-08-28 MED ORDER — ONDANSETRON HCL 4 MG/2ML IJ SOLN: 4.0000 mg | Freq: Three times a day (TID) | INTRAMUSCULAR | Status: DC | PRN

## 2023-08-28 NOTE — Plan of Care (Signed)

## 2023-08-28 NOTE — Plan of Care (Signed)

## 2023-08-28 NOTE — Progress Notes (Signed)
   Patient Name: Alejandra Lowe Date of Encounter: 08/28/2023 [facility] Cardiologist: None   Interval Summary  .    Feeling well without acute complaint  Vital Signs .    Vitals:   08/28/23 0600 08/28/23 0615 08/28/23 0700 08/28/23 0715  BP: (!) 155/62 (!) 149/70 (!) 158/68 (!) 150/66  Pulse: 69 67 74 71  Resp: 12 14 20 19   Temp:      TempSrc:      SpO2: 99% 98% 94% 98%    Intake/Output Summary (Last 24 hours) at 08/28/2023 0801 Last data filed at 08/28/2023 0400 Gross per 24 hour  Intake --  Output 300 ml  Net -300 ml      08/26/2023    6:27 PM 08/26/2023    3:59 PM 05/04/2023    3:02 PM  Last 3 Weights  Weight (lbs) 162 lb 14.7 oz 140 lb 138 lb  Weight (kg) 73.9 kg 63.504 kg 62.596 kg      Telemetry/ECG    Sinus rhythm- Personally Reviewed  Physical Exam .   GEN: No acute distress.   Neck: No JVD Cardiac: RRR, no murmurs, rubs, or gallops.  Respiratory: Clear to auscultation bilaterally. GI: Soft, nontender, non-distended  MS: No edema  Assessment & Plan .     1.  Complete heart block: Has right bundle branch escape.  Heart block is intermittent.  She is not pacing.  She does have a temporary pacing wire in place.  For now we Noreen Mackintosh plan for pacemaker implant on Monday.  Americo Vallery keep n.p.o. after midnight Sunday night.  2.  Dementia: Pleasant and cooperative.  3.  Type 1 diabetes: Per primary team  4.  Hypertension: Mildly elevated today but stable.  Continue hydralazine   For questions or updates, please contact [place] Please consult [email or web address] for contact info under        Signed, Beverlyann Broxterman Cortland Ding, MD

## 2023-08-29 ENCOUNTER — Encounter (HOSPITAL_COMMUNITY)
Admission: EM | Disposition: A | Discharge status: Home / Self Care | Payer: Self-pay | Source: Other Acute Inpatient Hospital | Attending: Internal Medicine

## 2023-08-29 ENCOUNTER — Encounter (HOSPITAL_COMMUNITY): Payer: Self-pay

## 2023-08-29 ENCOUNTER — Encounter (HOSPITAL_COMMUNITY): Payer: Self-pay | Admitting: Internal Medicine

## 2023-08-29 DIAGNOSIS — I442 Atrioventricular block, complete: Principal | ICD-10-CM | POA: Diagnosis present

## 2023-08-29 DIAGNOSIS — R001 Bradycardia, unspecified: Secondary | ICD-10-CM

## 2023-08-29 HISTORY — PX: PACEMAKER IMPLANT: EP1218

## 2023-08-29 LAB — SURGICAL PCR SCREEN
MRSA, PCR: NEGATIVE
Staphylococcus aureus: NEGATIVE

## 2023-08-29 LAB — GLUCOSE, CAPILLARY
Glucose-Capillary: 141 mg/dL — ABNORMAL HIGH (ref 70–99)
Glucose-Capillary: 142 mg/dL — ABNORMAL HIGH (ref 70–99)
Glucose-Capillary: 159 mg/dL — ABNORMAL HIGH (ref 70–99)
Glucose-Capillary: 160 mg/dL — ABNORMAL HIGH (ref 70–99)
Glucose-Capillary: 207 mg/dL — ABNORMAL HIGH (ref 70–99)
Glucose-Capillary: 215 mg/dL — ABNORMAL HIGH (ref 70–99)
Glucose-Capillary: 234 mg/dL — ABNORMAL HIGH (ref 70–99)
Glucose-Capillary: 253 mg/dL — ABNORMAL HIGH (ref 70–99)

## 2023-08-29 SURGERY — PACEMAKER IMPLANT
Anesthesia: LOCAL

## 2023-08-29 MED ORDER — FENTANYL CITRATE (PF) 100 MCG/2ML IJ SOLN
INTRAMUSCULAR | Status: AC
  Filled 2023-08-29: qty 2

## 2023-08-29 MED ORDER — LIDOCAINE HCL 1 % IJ SOLN
INTRAMUSCULAR | Status: AC
  Filled 2023-08-29: qty 60

## 2023-08-29 MED ORDER — MIDAZOLAM HCL 2 MG/2ML IJ SOLN
INTRAMUSCULAR | Status: AC
  Filled 2023-08-29: qty 2

## 2023-08-29 MED ORDER — CHLORHEXIDINE GLUCONATE 4 % EX SOLN
60.0000 mL | Freq: Once | CUTANEOUS | Status: DC
  Filled 2023-08-29: qty 60

## 2023-08-29 MED ORDER — SODIUM CHLORIDE 0.9% FLUSH: 3.0000 mL | INTRAVENOUS | Status: DC | PRN

## 2023-08-29 MED ORDER — MIDAZOLAM HCL 5 MG/5ML IJ SOLN
INTRAMUSCULAR | Status: DC | PRN
Start: 2023-08-29 — End: 2023-08-29
  Administered 2023-08-29: 1 mg via INTRAVENOUS

## 2023-08-29 MED ORDER — FENTANYL CITRATE (PF) 100 MCG/2ML IJ SOLN
INTRAMUSCULAR | Status: DC | PRN
  Administered 2023-08-29: 25 ug via INTRAVENOUS

## 2023-08-29 MED ORDER — HEPARIN (PORCINE) IN NACL 1000-0.9 UT/500ML-% IV SOLN
INTRAVENOUS | Status: DC | PRN
Start: 2023-08-29 — End: 2023-08-29
  Administered 2023-08-29: 500 mL

## 2023-08-29 MED ORDER — CEFAZOLIN SODIUM-DEXTROSE 1-4 GM/50ML-% IV SOLN
1.0000 g | Freq: Four times a day (QID) | INTRAVENOUS | Status: AC
  Administered 2023-08-29 – 2023-08-30 (×3): 1 g via INTRAVENOUS
  Filled 2023-08-29 (×5): qty 50

## 2023-08-29 MED ORDER — SODIUM CHLORIDE 0.9 % IV SOLN
INTRAVENOUS | Status: AC
  Filled 2023-08-29: qty 2

## 2023-08-29 MED ORDER — LIDOCAINE HCL (PF) 1 % IJ SOLN
INTRAMUSCULAR | Status: DC | PRN
  Administered 2023-08-29: 60 mL

## 2023-08-29 MED ORDER — CEFAZOLIN SODIUM-DEXTROSE 2-4 GM/100ML-% IV SOLN
2.0000 g | INTRAVENOUS | Status: AC
  Administered 2023-08-29: 2 g via INTRAVENOUS
  Filled 2023-08-29: qty 100

## 2023-08-29 MED ORDER — SODIUM CHLORIDE 0.9 % IV SOLN: INTRAVENOUS | Status: DC

## 2023-08-29 MED ORDER — SODIUM CHLORIDE 0.9 % IV SOLN
80.0000 mg | INTRAVENOUS | Status: AC
  Administered 2023-08-29: 80 mg
  Filled 2023-08-29: qty 2

## 2023-08-29 MED ORDER — SODIUM CHLORIDE 0.9 % IV SOLN: 250.0000 mL | INTRAVENOUS | Status: DC

## 2023-08-29 MED ORDER — MUPIROCIN 2 % EX OINT
1.0000 | TOPICAL_OINTMENT | Freq: Two times a day (BID) | CUTANEOUS | Status: DC
Start: 2023-08-29 — End: 2023-09-03
  Administered 2023-08-29 – 2023-08-30 (×2): 1 via NASAL
  Filled 2023-08-29 (×3): qty 22

## 2023-08-29 SURGICAL SUPPLY — 11 items
CABLE SURGICAL S-101-97-12 (CABLE) ×1 IMPLANT
CATH RIGHTSITE C315HIS02 (CATHETERS) IMPLANT
IPG PACE AZUR XT DR MRI W1DR01 (Pacemaker) IMPLANT
KIT MICROPUNCTURE NIT STIFF (SHEATH) IMPLANT
LEAD CAPSURE NOVUS 5076-52CM (Lead) IMPLANT
LEAD SELECT SECURE 3830 383069 (Lead) IMPLANT
PAD DEFIB RADIO PHYSIO CONN (PAD) ×1 IMPLANT
SHEATH 7FR PRELUDE SNAP 13 (SHEATH) IMPLANT
SLITTER 6232ADJ (MISCELLANEOUS) IMPLANT
TRAY PACEMAKER INSERTION (PACKS) ×1 IMPLANT
WIRE BENTSON .035X145CM (WIRE) IMPLANT

## 2023-08-29 NOTE — Discharge Instructions (Addendum)
 After Your Pacemaker   You have a Medtronic Pacemaker  ACTIVITY Do not lift your arm above shoulder height for 1 week after your procedure. After 7 days, you may progress as below.  You should remove your sling 24 hours after your procedure, unless otherwise instructed by your provider.     Sunday Sep 05, 2023  Monday Sep 06, 2023 Tuesday Sep 07, 2023 Wednesday Sep 08, 2023   Do not lift, push, pull, or carry anything over 10 pounds with the affected arm until 6 weeks (Sunday October 10, 2023 ) after your procedure.   You may drive AFTER your wound check, unless you have been told otherwise by your provider.   Ask your healthcare provider when you can go back to work   INCISION/Dressing If you are on a blood thinner such as Coumadin, Xarelto, Eliquis, Plavix, or Pradaxa please confirm with your provider when this should be resumed.   If large square, outer bandage is left in place, this can be removed after 24 hours from your procedure. Do not remove steri-strips or glue as below.   If a PRESSURE DRESSING (a bulky dressing that usually goes up over your shoulder) was applied or left in place, please follow instructions given by your provider on when to return to have this removed.   Monitor your Pacemaker site for redness, swelling, and drainage. Call the device clinic at [phone] if you experience these symptoms or fever/chills.  If your incision is sealed with Steri-strips or staples, you may shower 7 days after your procedure or when told by your provider. Do not remove the steri-strips or let the shower hit directly on your site. You may wash around your site with soap and water.    If you were discharged in a sling, please do not wear this during the day more than 48 hours after your surgery unless otherwise instructed. This may increase the risk of stiffness and soreness in your shoulder.   Avoid lotions, ointments, or perfumes over your incision until it is well-healed.  You  may use a hot tub or a pool AFTER your wound check appointment if the incision is completely closed.  Pacemaker Alerts:  Some alerts are vibratory and others beep. These are NOT emergencies. Please call our office to let us  know. If this occurs at night or on weekends, it can wait until the next business day. Send a remote transmission.  If your device is capable of reading fluid status (for heart failure), you will be offered monthly monitoring to review this with you.   DEVICE MANAGEMENT Remote monitoring is used to monitor your pacemaker from home. This monitoring is scheduled every 91 days by our office. It allows us  to keep an eye on the functioning of your device to ensure it is working properly. You will routinely see your Electrophysiologist annually (more often if necessary).   You should receive your ID card for your new device in 4-8 weeks. Keep this card with you at all times once received. Consider wearing a medical alert bracelet or necklace.  Your Pacemaker may be MRI compatible. This will be discussed at your next office visit/wound check.  You should avoid contact with strong electric or magnetic fields.   Do not use amateur (ham) radio equipment or electric (arc) welding torches. MP3 player headphones with magnets should not be used. Some devices are safe to use if held at least 12 inches (30 cm) from your Pacemaker. These include power tools, lawn  mowers, and speakers. If you are unsure if something is safe to use, ask your health care provider.  When using your cell phone, hold it to the ear that is on the opposite side from the Pacemaker. Do not leave your cell phone in a pocket over the Pacemaker.  You may safely use electric blankets, heating pads, computers, and microwave ovens.  Call the office right away if: You have chest pain. You feel more short of breath than you have felt before. You feel more light-headed than you have felt before. Your incision starts to open  up.  This information is not intended to replace advice given to you by your health care provider. Make sure you discuss any questions you have with your health care provider.

## 2023-08-29 NOTE — Progress Notes (Signed)
   Patient Name: Alejandra Lowe Date of Encounter: 08/29/2023 [facility] Cardiologist: None   Interval Summary  .    Feeling well without acute complaint  Vital Signs .    Vitals:   08/29/23 0530 08/29/23 0600 08/29/23 0630 08/29/23 0700  BP: (!) 149/68 (!) 146/61 (!) 140/72 133/66  Pulse: 87 84 82 88  Resp: 13 14 14 19   Temp:      TempSrc:      SpO2: 98% 98% 98% 98%  Weight:        Intake/Output Summary (Last 24 hours) at 08/29/2023 0808 Last data filed at 08/29/2023 0000 Gross per 24 hour  Intake 10 ml  Output 1150 ml  Net -1140 ml      08/29/2023    4:20 AM 08/26/2023    6:27 PM 08/26/2023    3:59 PM  Last 3 Weights  Weight (lbs) 143 lb 8.3 oz 162 lb 14.7 oz 140 lb  Weight (kg) 65.1 kg 73.9 kg 63.504 kg      Telemetry/ECG    Sinus rhythm- Personally Reviewed  Physical Exam .   GEN: No acute distress.   Neck: No JVD Cardiac: RRR, no murmurs, rubs, or gallops.  Respiratory: Clear to auscultation bilaterally. GI: Soft, nontender, non-distended  MS: No edema  Assessment & Plan .     1.  Intermittent complete heart block: Has right bundle escape when she is in heart block.  She has a temporary pacing wire in place, though the threshold is significantly elevated and has intermittent capture at maximum output.  She is not requiring pacing at this time.  If she does require pacing Alejandra Lowe need adjustment of her pacing wire.  Alejandra Lowe plan for pacemaker implant tomorrow.  Alejandra Lowe keep n.p.o. after midnight tonight.  2.  Dementia: Pleasant and cooperative  3.  Type 1 diabetes: Per primary team  4.  Hypertension: Stable  For questions or updates, please contact  HeartCare Please consult [email or web address] for contact info under        Signed, Alejandra Franzoni Cortland Ding, MD

## 2023-08-29 NOTE — Progress Notes (Signed)
 I Met Alejandra Lowe and reviewed her chart. Her pacemaker wire is not functioing properly.  She has not eaten anything in the past 2 hours. I reviewed her labs.  We discussed the pacemaker implant. I discussed the indication for the procedure and the logistics, risks, potential benefit, and after care. I specifically explained that risks include but are not limited to infection, bleeding,damage to blood vessels, lung, and the heart -- but risk of prolonged hospitalization, need for surgery, or the event of stroke, heart attack, or death are low but not zero.  I also spoke with her husband, Forestine Igo, by phone, and he is aware and in agreement.  Will proceed with placement of a dual chamber pacemaker for paroxysmal complete heart block. We will place the atrial lead in the ventricle temporarily to have reliable back-up pacing during the procedure.

## 2023-08-29 NOTE — Plan of Care (Signed)

## 2023-08-29 NOTE — Discharge Summary (Signed)
 ELECTROPHYSIOLOGY PROCEDURE DISCHARGE SUMMARY    Patient ID: Alejandra Lowe,  MRN: [id number], DOB/AGE: June 07, 1945 78 y.o.  Admit date: 08/27/2023 Discharge date: 08/30/2023  Primary Care Physician: Jimmy Moulding, MD  Primary Cardiologist: None  Electrophysiologist: Dr. Arlester Ladd   Primary Discharge Diagnosis:  Intermittent heart block bradycardia status post pacemaker implantation this admission  Secondary Discharge Diagnosis:  Dementia DM1  No Known Allergies   Procedures This Admission:  1.  Implantation of a Medtronic Dual Chamber PPM on 08/29/2023 by Dr. Arlester Ladd. The patient received a Medtronic Azure XT DR [id number]  with a Medtronic CapSureFix Novus 5076 right atrial lead and a Medtronic SelectSure 3830 right ventricular lead.  There were no immediate post procedure complications.   2.  CXR on 08/29/2023 demonstrated no pneumothorax status post device implantation.       Brief HPI: Alejandra Lowe is a 78 y.o. female was admitted for fall and head laceration, and found to be in CHB on presentation and electrophysiology team asked to see for consideration of PPM implantation.  Past medical history includes above. Pt underwent urgent temp wire 5/15 by Dr. Addie Holstein at [facility], and was transferred to [facility] for permanent pacing. She developed increased threshold with only intermittent pacing at max threshold on 5/18, and underwent permanent PPM. The patient has had AV block without reversible causes identified.  Risks, benefits, and alternatives to PPM implantation were reviewed with the patient who wished to proceed.   Hospital Course:  The patient was admitted and underwent implantation of a Medtronic dual chamber PPM with details as outlined above.  She was monitored on telemetry overnight which demonstrated appropriate pacing.  Left chest was without hematoma or ecchymosis.  The device was interrogated and found to be functioning normally.  CXR was obtained and demonstrated no  pneumothorax status post device implantation.  Wound care, arm mobility, and restrictions were reviewed with the patient.  The patient was examined and considered stable for discharge to home.    Pt will resume insulin  pump at discharge. Have asked that she have her husband bring one when he comes to pick her up.   Anticoagulation resumption This patient is not on anticoagulation     Physical Exam: Vitals:   08/29/23 2352 08/30/23 0120 08/30/23 0458 08/30/23 0743  BP: (!) 174/74 (!) 141/59 (!) 145/55 (!) 145/70  Pulse: 80  90 80  Resp: 16  17 16   Temp: 98.6 F (37 C)  98 F (36.7 C) 98.5 F (36.9 C)  TempSrc: Oral  Oral Oral  SpO2:    100%  Weight:      Height:        GEN- NAD. A&O x 3.  HEENT: Normocephalic, atraumatic Lungs- CTAB, Normal effort.  Heart- RRR, No M/G/R.  GI- Soft, NT, ND.  Extremities- No clubbing, cyanosis, or edema;  Skin- warm and dry, no rash or lesion, left chest without hematoma/ecchymosis  Discharge Medications:  Allergies as of 08/30/2023   No Known Allergies      Medication List     TAKE these medications    acetaminophen  325 MG tablet Commonly known as: TYLENOL  Take 2 tablets (650 mg total) by mouth every 6 (six) hours as needed for mild pain (pain score 1-3).   atorvastatin  10 MG tablet Commonly known as: LIPITOR Take 10 mg by mouth daily.   calcium  carbonate 1500 (600 Ca) MG Tabs tablet Commonly known as: OSCAL Take 600 mg of elemental calcium  by mouth daily with  breakfast.   donepezil 5 MG tablet Commonly known as: ARICEPT Take 5 mg by mouth at bedtime.   FreeStyle Libre 14 Day Reader Seymour Dapper 1 kit by Does not apply route as directed.   FreeStyle Libre 14 Day Sensor Misc 1 each by Does not apply route every 14 (fourteen) days.   Gvoke HypoPen 2-Pack 1 MG/0.2ML Soaj Generic drug: Glucagon Inject 1 mg into the skin once as needed (allergic reaction).   insulin  aspart 100 UNIT/ML injection Commonly known as: novoLOG  Inject  0-9 Units into the skin every 4 (four) hours.   insulin  aspart 100 UNIT/ML injection Commonly known as: novoLOG  Inject 0-9 Units into the skin every 4 (four) hours.   insulin  glargine-yfgn 100 UNIT/ML injection Commonly known as: SEMGLEE  Inject 0.14 mLs (14 Units total) into the skin daily.   losartan 25 MG tablet Commonly known as: COZAAR Take 1 tablet by mouth daily.   meclizine 12.5 MG tablet Commonly known as: ANTIVERT Take 12.5 mg by mouth 3 (three) times daily as needed for dizziness.   triamcinolone cream 0.1 % Commonly known as: KENALOG Apply 1 Application topically 2 (two) times daily as needed (itching).   Vitamin D 50 MCG (2000 UT) Caps Take 2,000 Units by mouth daily.   vitamin E  180 MG (400 UNITS) capsule Take 400 Units by mouth daily.        Disposition:  Home with usual follow up as in AVS   Duration of Discharge Encounter:  APP time: 25 minutes  Signed, Tylene Galla, PA-C  08/30/2023 9:19 AM

## 2023-08-30 ENCOUNTER — Encounter (HOSPITAL_COMMUNITY): Payer: Self-pay | Admitting: Cardiovascular Disease

## 2023-08-30 ENCOUNTER — Inpatient Hospital Stay (HOSPITAL_COMMUNITY)

## 2023-08-30 LAB — GLUCOSE, CAPILLARY
Glucose-Capillary: 157 mg/dL — ABNORMAL HIGH (ref 70–99)
Glucose-Capillary: 130 mg/dL — ABNORMAL HIGH (ref 70–99)

## 2023-08-30 SURGERY — PACEMAKER IMPLANT

## 2023-08-30 MED ORDER — ACETAMINOPHEN 325 MG PO TABS: 650.0000 mg | ORAL_TABLET | Freq: Four times a day (QID) | ORAL | Status: AC | PRN

## 2023-08-30 MED FILL — Lidocaine HCl Local Inj 1%: INTRAMUSCULAR | Qty: 60 | Status: AC

## 2023-08-30 NOTE — Plan of Care (Signed)
   Problem: Education: Goal: Knowledge of General Education information will improve Description Including pain rating scale, medication(s)/side effects and non-pharmacologic comfort measures Outcome: Progressing   Problem: Health Behavior/Discharge Planning: Goal: Ability to manage health-related needs will improve Outcome: Progressing

## 2023-09-08 ENCOUNTER — Ambulatory Visit: Attending: Cardiology

## 2023-09-08 DIAGNOSIS — I442 Atrioventricular block, complete: Secondary | ICD-10-CM

## 2023-09-08 LAB — CUP PACEART INCLINIC DEVICE CHECK
Date Time Interrogation Session: 20250528091832
Implantable Lead Connection Status: 753985
Implantable Lead Connection Status: 753985
Implantable Lead Implant Date: 20250518
Implantable Lead Implant Date: 20250518
Implantable Lead Location: 753859
Implantable Lead Location: 753860
Implantable Lead Model: 3830
Implantable Lead Model: 5076
Implantable Pulse Generator Implant Date: 20250518

## 2023-09-08 NOTE — Patient Instructions (Signed)

## 2023-09-08 NOTE — Progress Notes (Signed)
 Normal dual chamber pacemaker wound check. Presenting rhythm: AS/VS 71 . Wound well healed. Routine testing performed. Thresholds, sensing, and impedances consistent with implant measurements and at 3.5V safety margin/auto capture until 3 month visit. No episodes. Reviewed arm restrictions to continue for 6 weeks total post op.  Pt enrolled in remote follow-up.

## 2023-09-15 ENCOUNTER — Ambulatory Visit: Payer: Self-pay | Admitting: Cardiovascular Disease

## 2023-09-22 DIAGNOSIS — I10 Essential (primary) hypertension: Secondary | ICD-10-CM | POA: Diagnosis not present

## 2023-09-22 DIAGNOSIS — R4189 Other symptoms and signs involving cognitive functions and awareness: Secondary | ICD-10-CM | POA: Diagnosis not present

## 2023-09-22 DIAGNOSIS — R55 Syncope and collapse: Secondary | ICD-10-CM | POA: Diagnosis not present

## 2023-09-29 DIAGNOSIS — E10649 Type 1 diabetes mellitus with hypoglycemia without coma: Secondary | ICD-10-CM | POA: Diagnosis not present

## 2023-10-05 ENCOUNTER — Ambulatory Visit: Admitting: Physician Assistant

## 2023-10-05 VITALS — BP 144/74 | HR 86 | Ht 67.0 in | Wt 137.5 lb

## 2023-10-05 DIAGNOSIS — N390 Urinary tract infection, site not specified: Secondary | ICD-10-CM

## 2023-10-05 LAB — URINALYSIS, COMPLETE
Bilirubin, UA: NEGATIVE
Glucose, UA: NEGATIVE
Ketones, UA: NEGATIVE
Nitrite, UA: NEGATIVE
Protein,UA: NEGATIVE
RBC, UA: NEGATIVE
Specific Gravity, UA: 1.01 (ref 1.005–1.030)
Urobilinogen, Ur: 1 mg/dL (ref 0.2–1.0)
pH, UA: 6.5 (ref 5.0–7.5)

## 2023-10-05 LAB — MICROSCOPIC EXAMINATION: WBC, UA: >30 /HPF — AB (ref 0–5)

## 2023-10-05 MED ORDER — SULFAMETHOXAZOLE-TRIMETHOPRIM 800-160 MG PO TABS: 1.0000 | ORAL_TABLET | Freq: Two times a day (BID) | ORAL | 0 refills | Status: AC

## 2023-10-05 NOTE — Progress Notes (Signed)
 10/05/2023 4:20 PM   Alejandra Lowe [birth year] [id number]  CC: Chief Complaint  Patient presents with   Recurrent UTI   HPI: Alejandra Lowe is a 78 y.o. female with PMH recurrent UTI previously on suppressive Macrobid , GSM on topical vaginal estrogen cream, and microscopic hematuria with negative workup in 2021 who presents today for evaluation of possible UTI.  She is accompanied today by her husband, Alejandra Lowe.  Today she reports a 10-day history of dysuria.  She denies fever, chills, nausea, or vomiting.  She has been taking urinary pain relievers, which helped.  This would represent her first UTI in about 2 years.  In-office UA today positive for 2+ leukocytes; urine microscopy with >30 WBCs/HPF and many bacteria.   PMH: Past Medical History:  Diagnosis Date   Arthritis    Basal cell carcinoma 05/27/2020   R upper back - ED&C   Breast cancer (HCC) 1997   left breast, radiation, chemo   Diabetes mellitus without complication (HCC)    Dysplastic nevus 02/24/2021   Left distal ant thigh   Glaucoma    Hyperlipidemia    Hypertension    Personal history of chemotherapy 1997   BREAST CA   Personal history of radiation therapy 1997   BREAST CA   Seizure (HCC)     Surgical History: Past Surgical History:  Procedure Laterality Date   BREAST EXCISIONAL BIOPSY Left 1997   positive   BREAST LUMPECTOMY Left 1997   W/ radiation   PACEMAKER IMPLANT N/A 08/29/2023   Procedure: PACEMAKER IMPLANT;  Surgeon: Nancey Eulas BRAVO, MD;  Location: [facility] INVASIVE CV LAB;  Service: Cardiovascular;  Laterality: N/A;   ROTATOR CUFF REPAIR     TEMPORARY PACEMAKER N/A 08/26/2023   Procedure: TEMPORARY PACEMAKER;  Surgeon: Anner Alm ORN, MD;  Location: [facility] INVASIVE CV LAB;  Service: Cardiovascular;  Laterality: N/A;    Home Medications:  Allergies as of 10/05/2023   No Known Allergies      Medication List        Accurate as of October 05, 2023  4:20 PM. If you have any questions, ask  your nurse or doctor.          STOP taking these medications    insulin  glargine-yfgn 100 UNIT/ML injection Commonly known as: SEMGLEE    meclizine 12.5 MG tablet Commonly known as: ANTIVERT       TAKE these medications    acetaminophen  325 MG tablet Commonly known as: TYLENOL  Take 2 tablets (650 mg total) by mouth every 6 (six) hours as needed for mild pain (pain score 1-3).   atorvastatin  10 MG tablet Commonly known as: LIPITOR Take 10 mg by mouth daily.   calcium  carbonate 1500 (600 Ca) MG Tabs tablet Commonly known as: OSCAL Take 600 mg of elemental calcium  by mouth daily with breakfast.   donepezil 5 MG tablet Commonly known as: ARICEPT Take 5 mg by mouth at bedtime.   FreeStyle Libre 14 Day Reader Espiridion 1 kit by Does not apply route as directed.   FreeStyle Libre 14 Day Sensor Misc 1 each by Does not apply route every 14 (fourteen) days.   Gvoke HypoPen 2-Pack 1 MG/0.2ML Soaj Generic drug: Glucagon Inject 1 mg into the skin once as needed (allergic reaction).   insulin  aspart 100 UNIT/ML injection Commonly known as: novoLOG  Inject 0-9 Units into the skin every 4 (four) hours.   losartan 25 MG tablet Commonly known as: COZAAR Take 1 tablet by mouth daily.  sulfamethoxazole -trimethoprim  800-160 MG tablet Commonly known as: BACTRIM  DS Take 1 tablet by mouth 2 (two) times daily for 5 days.   triamcinolone cream 0.1 % Commonly known as: KENALOG Apply 1 Application topically 2 (two) times daily as needed (itching).   Vitamin D 50 MCG (2000 UT) Caps Take 2,000 Units by mouth daily.   vitamin E  180 MG (400 UNITS) capsule Take 400 Units by mouth daily.        Allergies:  No Known Allergies  Family History: Family History  Problem Relation Age of Onset   Breast cancer Mother 5   Breast cancer Sister 35    Social History:   reports that she has never smoked. She has never used smokeless tobacco. She reports current alcohol use of about 7.0  standard drinks of alcohol per week. She reports that she does not use drugs.  Physical Exam: BP (!) 144/74   Pulse 86   Ht 5' 7 (1.702 m)   Wt 137 lb 8 oz (62.4 kg)   BMI 21.54 kg/m   Constitutional:  Alert and oriented, no acute distress, nontoxic appearing HEENT: [place], AT Cardiovascular: No clubbing, cyanosis, or edema Respiratory: Normal respiratory effort, no increased work of breathing Skin: No rashes, bruises or suspicious lesions Neurologic: Grossly intact, no focal deficits, moving all 4 extremities Psychiatric: Normal mood and affect  Laboratory Data: Results for orders placed or performed in visit on 10/05/23  Microscopic Examination   Collection Time: 10/05/23  2:32 PM   Urine  Result Value Ref Range   WBC, UA >30 (A) 0 - 5 /hpf   RBC, Urine 0-2 0 - 2 /hpf   Epithelial Cells (non renal) 0-10 0 - 10 /hpf   Bacteria, UA Many (A) None seen/Few  Urinalysis, Complete   Collection Time: 10/05/23  2:32 PM  Result Value Ref Range   Specific Gravity, UA 1.010 1.005 - 1.030   pH, UA 6.5 5.0 - 7.5   Color, UA Yellow Yellow   Appearance Ur Slightly cloudy Clear   Leukocytes,UA 2+ (A) Negative   Protein,UA Negative Negative/Trace   Glucose, UA Negative Negative   Ketones, UA Negative Negative   RBC, UA Negative Negative   Bilirubin, UA Negative Negative   Urobilinogen, Ur 1.0 0.2 - 1.0 mg/dL   Nitrite, UA Negative Negative   Microscopic Examination See below:    Assessment & Plan:   1. Recurrent UTI (Primary) UA is positive, will start empiric Bactrim  and send for culture.  She no longer meets diagnostic criteria for recurrent UTI.  If these become more frequent again, may consider resuming suppressive antibiotics. - Urinalysis, Complete - CULTURE, URINE COMPREHENSIVE - sulfamethoxazole -trimethoprim  (BACTRIM  DS) 800-160 MG tablet; Take 1 tablet by mouth 2 (two) times daily for 5 days.  Dispense: 10 tablet; Refill: 0  Return if symptoms worsen or fail to  improve.  Lucie Hones, PA-C  [facility] [place] [address], [address] [place], [place] [address] [phone]

## 2023-10-07 ENCOUNTER — Ambulatory Visit: Payer: Self-pay | Admitting: Physician Assistant

## 2023-10-07 LAB — CULTURE, URINE COMPREHENSIVE

## 2023-10-08 ENCOUNTER — Ambulatory Visit: Admitting: Physician Assistant

## 2023-10-08 MED ORDER — CEPHALEXIN 500 MG PO CAPS: 500.0000 mg | ORAL_CAPSULE | Freq: Two times a day (BID) | ORAL | 0 refills | Status: DC

## 2023-10-11 ENCOUNTER — Ambulatory Visit

## 2023-10-11 DIAGNOSIS — I442 Atrioventricular block, complete: Secondary | ICD-10-CM | POA: Diagnosis not present

## 2023-10-12 LAB — CUP PACEART REMOTE DEVICE CHECK
Battery Remaining Longevity: 181 mo
Battery Voltage: 3.23 V
Brady Statistic AP VP Percent: 0.21 %
Brady Statistic AP VS Percent: 3.66 %
Brady Statistic AS VP Percent: 6.9 %
Brady Statistic AS VS Percent: 89.23 %
Brady Statistic RA Percent Paced: 3.77 %
Brady Statistic RV Percent Paced: 7.11 %
Date Time Interrogation Session: 20250630002839
Implantable Lead Connection Status: 753985
Implantable Lead Connection Status: 753985
Implantable Lead Implant Date: 20250518
Implantable Lead Implant Date: 20250518
Implantable Lead Location: 753859
Implantable Lead Location: 753860
Implantable Lead Model: 3830
Implantable Lead Model: 5076
Implantable Pulse Generator Implant Date: 20250518
Lead Channel Impedance Value: 399 Ohm
Lead Channel Impedance Value: 399 Ohm
Lead Channel Impedance Value: 494 Ohm
Lead Channel Impedance Value: 551 Ohm
Lead Channel Pacing Threshold Amplitude: 0.5 V
Lead Channel Pacing Threshold Amplitude: 0.625 V
Lead Channel Pacing Threshold Pulse Width: 0.4 ms
Lead Channel Pacing Threshold Pulse Width: 0.4 ms
Lead Channel Sensing Intrinsic Amplitude: 13.75 mV
Lead Channel Sensing Intrinsic Amplitude: 13.75 mV
Lead Channel Sensing Intrinsic Amplitude: 3.625 mV
Lead Channel Sensing Intrinsic Amplitude: 3.625 mV
Lead Channel Setting Pacing Amplitude: 2 V
Lead Channel Setting Pacing Amplitude: 2 V
Lead Channel Setting Pacing Pulse Width: 0.4 ms
Lead Channel Setting Sensing Sensitivity: 1.2 mV
Zone Setting Status: 755011

## 2023-10-17 ENCOUNTER — Ambulatory Visit: Payer: Self-pay | Admitting: Cardiovascular Disease

## 2023-10-21 ENCOUNTER — Telehealth: Payer: Self-pay | Admitting: Physician Assistant

## 2023-10-21 NOTE — Telephone Encounter (Signed)
 Patient called requesting a refill for cephalexin  (Keflex ). Patient requested a call back.

## 2023-10-21 NOTE — Telephone Encounter (Signed)
 Please offer soonest available UTI visit, or go to UC/PCP.

## 2023-10-21 NOTE — Telephone Encounter (Signed)
 Pt states she still having UTI symptoms, pt states she has a yucky feeling in the stomach/ lower abdomen  feels heavy down there. Pt states it feels like something needs to come out. Pt states no burning, no urine odor or cloudy urine. Pt is emptying bladder well, state she has one BM a day. Please advise.

## 2023-10-22 NOTE — Telephone Encounter (Signed)
 LVM for pt to return call, to schedule an appt.

## 2023-11-24 ENCOUNTER — Encounter: Payer: Self-pay | Admitting: Physician Assistant

## 2023-11-24 ENCOUNTER — Ambulatory Visit: Admitting: Physician Assistant

## 2023-11-24 VITALS — BP 139/76 | HR 80 | Ht 67.0 in | Wt 136.0 lb

## 2023-11-24 DIAGNOSIS — N390 Urinary tract infection, site not specified: Secondary | ICD-10-CM | POA: Diagnosis not present

## 2023-11-24 LAB — URINALYSIS, COMPLETE
Bilirubin, UA: NEGATIVE
Nitrite, UA: POSITIVE — AB
RBC, UA: NEGATIVE
Specific Gravity, UA: 1.01 (ref 1.005–1.030)
Urobilinogen, Ur: 2 mg/dL — ABNORMAL HIGH (ref 0.2–1.0)
pH, UA: 6 (ref 5.0–7.5)

## 2023-11-24 LAB — MICROSCOPIC EXAMINATION: WBC, UA: >30 /HPF — AB (ref 0–5)

## 2023-11-24 MED ORDER — CEPHALEXIN 500 MG PO CAPS: 500.0000 mg | ORAL_CAPSULE | Freq: Two times a day (BID) | ORAL | 0 refills | Status: AC

## 2023-11-24 NOTE — Progress Notes (Signed)
 11/24/2023 2:39 PM   Alejandra Lowe January 08, 1946 [id number]  CC: Chief Complaint  Patient presents with   Recurrent UTI   HPI: Alejandra Lowe is a 78 y.o. female with PMH mild dementia on Aricept, recurrent UTI previously on suppressive Macrobid , GSM previously on topical vaginal estrogen cream, and microscopic hematuria with negative workup in 2021 who presents today for valuation of possible UTI.  She is accompanied today by her husband, Alejandra Lowe.   Today she reports 1 week of dysuria without fever, chills, nausea, or vomiting.  She called us  last month with reports of the same, but I was unable to see her in clinic and we advised her to go to urgent care.  She states she did not seek care for her symptoms at that time and she has been using Azo.  She is not using topical vaginal estrogen cream, unclear when this was stopped.  Her husband recalls seeing a tube of cream at their home a while ago.  In-office UA today positive for 2+ leukocytes, 1+ protein, trace glucose, 2.0 EU/DL urobilinogen, nitrites, and trace ketones; urine microscopy with >30 WBCs/HPF and many bacteria.  PMH: Past Medical History:  Diagnosis Date   Arthritis    Basal cell carcinoma 05/27/2020   R upper back - ED&C   Breast cancer (HCC) 1997   left breast, radiation, chemo   Diabetes mellitus without complication (HCC)    Dysplastic nevus 02/24/2021   Left distal ant thigh   Glaucoma    Hyperlipidemia    Hypertension    Personal history of chemotherapy 1997   BREAST CA   Personal history of radiation therapy 1997   BREAST CA   Seizure (HCC)     Surgical History: Past Surgical History:  Procedure Laterality Date   BREAST EXCISIONAL BIOPSY Left 1997   positive   BREAST LUMPECTOMY Left 1997   W/ radiation   PACEMAKER IMPLANT N/A 08/29/2023   Procedure: PACEMAKER IMPLANT;  Surgeon: Nancey Eulas BRAVO, MD;  Location: [facility] INVASIVE CV LAB;  Service: Cardiovascular;  Laterality: N/A;   ROTATOR CUFF REPAIR      TEMPORARY PACEMAKER N/A 08/26/2023   Procedure: TEMPORARY PACEMAKER;  Surgeon: Anner Alm ORN, MD;  Location: [facility] INVASIVE CV LAB;  Service: Cardiovascular;  Laterality: N/A;    Home Medications:  Allergies as of 11/24/2023   No Known Allergies      Medication List        Accurate as of November 24, 2023  2:39 PM. If you have any questions, ask your nurse or doctor.          acetaminophen  325 MG tablet Commonly known as: TYLENOL  Take 2 tablets (650 mg total) by mouth every 6 (six) hours as needed for mild pain (pain score 1-3).   atorvastatin  10 MG tablet Commonly known as: LIPITOR Take 10 mg by mouth daily.   calcium  carbonate 1500 (600 Ca) MG Tabs tablet Commonly known as: OSCAL Take 600 mg of elemental calcium  by mouth daily with breakfast.   cephALEXin  500 MG capsule Commonly known as: KEFLEX  Take 1 capsule (500 mg total) by mouth 2 (two) times daily for 5 days.   donepezil 5 MG tablet Commonly known as: ARICEPT Take 5 mg by mouth at bedtime.   FreeStyle Libre 14 Day Reader Espiridion 1 kit by Does not apply route as directed.   FreeStyle Libre 14 Day Sensor Misc 1 each by Does not apply route every 14 (fourteen) days.   Gvoke HypoPen  2-Pack 1 MG/0.2ML Soaj Generic drug: Glucagon Inject 1 mg into the skin once as needed (allergic reaction).   insulin  aspart 100 UNIT/ML injection Commonly known as: novoLOG  Inject 0-9 Units into the skin every 4 (four) hours.   losartan 25 MG tablet Commonly known as: COZAAR Take 1 tablet by mouth daily.   triamcinolone cream 0.1 % Commonly known as: KENALOG Apply 1 Application topically 2 (two) times daily as needed (itching).   Vitamin D 50 MCG (2000 UT) Caps Take 2,000 Units by mouth daily.   vitamin E  180 MG (400 UNITS) capsule Take 400 Units by mouth daily.        Allergies:  No Known Allergies  Family History: Family History  Problem Relation Age of Onset   Breast cancer Mother 44   Breast cancer  Sister 73    Social History:   reports that she has never smoked. She has never used smokeless tobacco. She reports current alcohol use of about 7.0 standard drinks of alcohol per week. She reports that she does not use drugs.  Physical Exam: BP 139/76 (BP Location: Left Arm, Patient Position: Sitting, Cuff Size: Normal)   Pulse 80   Ht 5' 7 (1.702 m)   Wt 136 lb (61.7 kg)   BMI 21.30 kg/m   Constitutional:  Alert and oriented, no acute distress, nontoxic appearing HEENT: [place], AT Cardiovascular: No clubbing, cyanosis, or edema Respiratory: Normal respiratory effort, no increased work of breathing Skin: No rashes, bruises or suspicious lesions Neurologic: Grossly intact, no focal deficits, moving all 4 extremities Psychiatric: Normal mood and affect  Laboratory Data: Results for orders placed or performed in visit on 11/24/23  Microscopic Examination   Collection Time: 11/24/23  2:16 PM   Urine  Result Value Ref Range   WBC, UA >30 (A) 0 - 5 /hpf   RBC, Urine 0-2 0 - 2 /hpf   Epithelial Cells (non renal) 0-10 0 - 10 /hpf   Casts Present (A) None seen /lpf   Cast Type Hyaline casts N/A   Bacteria, UA Many (A) None seen/Few  Urinalysis, Complete   Collection Time: 11/24/23  2:16 PM  Result Value Ref Range   Specific Gravity, UA 1.010 1.005 - 1.030   pH, UA 6.0 5.0 - 7.5   Color, UA Amber (A) Yellow   Appearance Ur Slightly cloudy Clear   Leukocytes,UA 2+ (A) Negative   Protein,UA 1+ (A) Negative/Trace   Glucose, UA Trace (A) Negative   Ketones, UA Trace (A) Negative   RBC, UA Negative Negative   Bilirubin, UA Negative Negative   Urobilinogen, Ur 2.0 (H) 0.2 - 1.0 mg/dL   Nitrite, UA Positive (A) Negative   Microscopic Examination See below:    Assessment & Plan:   1. Recurrent UTI (Primary) UA appears grossly infected, at least her second UTI in 2 months.  Now meeting diagnostic criteria for recurrent UTI again.  Will start empiric Keflex  and send for culture.  I  think she may have a hard time remembering to use estrogen cream consistently, so will defer this in favor of low-dose antibiotic prophylaxis, to be prescribed based on culture results. - Urinalysis, Complete - US  RENAL; Future - CULTURE, URINE COMPREHENSIVE - cephALEXin  (KEFLEX ) 500 MG capsule; Take 1 capsule (500 mg total) by mouth 2 (two) times daily for 5 days.  Dispense: 10 capsule; Refill: 0  Return for Will call to start suppressive abx per cx results.  Lucie Hones, PA-C  [facility]  [address], [address] [place], [place] [address] [phone]

## 2023-11-25 DIAGNOSIS — E103311 Type 1 diabetes mellitus with moderate nonproliferative diabetic retinopathy with macular edema, right eye: Secondary | ICD-10-CM | POA: Diagnosis not present

## 2023-11-25 DIAGNOSIS — E103392 Type 1 diabetes mellitus with moderate nonproliferative diabetic retinopathy without macular edema, left eye: Secondary | ICD-10-CM | POA: Diagnosis not present

## 2023-11-30 ENCOUNTER — Ambulatory Visit: Admission: RE | Admit: 2023-11-30 | Source: Ambulatory Visit

## 2023-11-30 ENCOUNTER — Telehealth: Payer: Self-pay

## 2023-11-30 NOTE — Telephone Encounter (Signed)
 Left VM on husbands phone in response to the evening triage line call from 8/18. Let them know that we are waiting on the culture to come back to prescribed new or continue current antibiotic.

## 2023-12-01 ENCOUNTER — Ambulatory Visit: Payer: Self-pay | Admitting: Physician Assistant

## 2023-12-01 ENCOUNTER — Telehealth: Payer: Self-pay | Admitting: Physician Assistant

## 2023-12-01 LAB — CULTURE, URINE COMPREHENSIVE

## 2023-12-01 MED ORDER — NITROFURANTOIN MONOHYD MACRO 100 MG PO CAPS: 100.0000 mg | ORAL_CAPSULE | Freq: Every day | ORAL | 6 refills | Status: DC

## 2023-12-01 NOTE — Telephone Encounter (Signed)
 Patient called stating she still feels some discomfort in lower abdomen, even after finishing her KEFLEX . Patient would like to know if another script can be sent to pharmacy or does she need to be seen? Please advise.

## 2023-12-01 NOTE — Progress Notes (Unsigned)
 Electrophysiology Clinic Note    Date:  12/02/2023  Patient ID:  Suhailah, Kwan 1945-11-25, MRN [id number] PCP:  Lenon Layman ORN, MD  Cardiologist:  None   Electrophysiologist:  Eulas FORBES Furbish, MD  Electrophysiology APP:  Romain Erion, NP     Discussed the use of AI scribe software for clinical note transcription with the patient, who gave verbal consent to proceed.   Patient Profile    Chief Complaint: 3mon PPM implant follow-up  History of Present Illness: Judye HEBAH BOGOSIAN is a 78 y.o. female with PMH notable for CHB s/p PPM, T1DM, HTN, dementia; seen today for Eulas FORBES Furbish, MD for routine electrophysiology follow-up s/p Pacemaker implant.  She presented to ER 08/27/2023 after syncopal episode where she was found to be in CHB with wide escape in 20s requiring a temp wire.  She is s/p PPM implant 5/18.   On follow-up today, she has no acute complaints related to her PPM. She denies chest pain, chest pressure, palpitations. She is back to her usual activities. She has two stables still in-place from her fall, placed by [facility] urgent care. Husband asks if they can be removed.       Arrhythmia/Device History MDT dual chamber PPM, imp 08/2023; dx CHB    ROS:  Please see the history of present illness. All other systems are reviewed and otherwise negative.    Physical Exam    VS:  BP 130/70 (BP Location: Left Arm, Patient Position: Sitting, Cuff Size: Normal)   Pulse 77   Ht 5' 7 (1.702 m)   Wt 137 lb 6.4 oz (62.3 kg)   SpO2 96%   BMI 21.52 kg/m  BMI: Body mass index is 21.52 kg/m.      Wt Readings from Last 3 Encounters:  12/02/23 137 lb 6.4 oz (62.3 kg)  11/24/23 136 lb (61.7 kg)  10/05/23 137 lb 8 oz (62.4 kg)     GEN- The patient is well appearing, alert and oriented x 3 today.   Head - two staples in place, easily removed Lungs- Clear to ausculation bilaterally, normal work of breathing.  Heart- Regular rate and rhythm, no murmurs,  rubs or gallops Extremities- No peripheral edema, warm, dry   PPM interrogated and reviewed by myself: Battery 14.7years Lead measurements stable No episodes VP 12.3% Good histograms No changes made  Studies Reviewed   Previous EP, cardiology notes.    EKG is ordered. Personal review of EKG from today shows:    EKG Interpretation Date/Time:  Thursday December 02 2023 14:15:45 EDT Ventricular Rate:  77 PR Interval:  160 QRS Duration:  92 QT Interval:  396 QTC Calculation: 448 R Axis:   -70  Text Interpretation: Sinus rhythm with sinus arrhythmia with Fusion complexes Incomplete right bundle branch block Left anterior fascicular block Confirmed by Javan Gonzaga [phone]) on 12/02/2023 2:19:27 PM    TTE, 08/27/2023  1. Left ventricular ejection fraction, by estimation, is 60 to 65%. The left ventricle has normal function. The left ventricle has no regional wall motion abnormalities. Left ventricular diastolic parameters were normal.   2. Right ventricular systolic function is normal. The right ventricular size is normal.   3. The mitral valve is normal in structure. No evidence of mitral valve regurgitation. No evidence of mitral stenosis.   4. The aortic valve is normal in structure. Aortic valve regurgitation is not visualized. Aortic valve sclerosis/calcification is present, without any evidence of aortic stenosis.   5. The  inferior vena cava is normal in size with greater than 50% respiratory variability, suggesting right atrial pressure of 3 mmHg.     Assessment and Plan     #) CHB s/p PPM #) syncope Device functioning well, see paceart for details Stable lead measurements No episodes Low VP Staples removed     Current medicines are reviewed at length with the patient today.   The patient does not have concerns regarding her medicines.  The following changes were made today:  none  Labs/ tests ordered today include:  Orders Placed This Encounter  Procedures   EKG  12-Lead     Disposition: Follow up with Dr. Nancey or EP APP in 12 months Continue remote monitoring   Signed, Chantal Needle, NP  12/02/23  3:04 PM  Electrophysiology [facility]

## 2023-12-02 ENCOUNTER — Ambulatory Visit: Attending: Cardiology | Admitting: Cardiology

## 2023-12-02 VITALS — BP 130/70 | HR 77 | Ht 67.0 in | Wt 137.4 lb

## 2023-12-02 DIAGNOSIS — I442 Atrioventricular block, complete: Secondary | ICD-10-CM | POA: Diagnosis not present

## 2023-12-02 DIAGNOSIS — Z950 Presence of cardiac pacemaker: Secondary | ICD-10-CM | POA: Diagnosis not present

## 2023-12-02 LAB — CUP PACEART INCLINIC DEVICE CHECK
Date Time Interrogation Session: 20250821150941
Implantable Lead Connection Status: 753985
Implantable Lead Connection Status: 753985
Implantable Lead Implant Date: 20250518
Implantable Lead Implant Date: 20250518
Implantable Lead Location: 753859
Implantable Lead Location: 753860
Implantable Lead Model: 3830
Implantable Lead Model: 5076
Implantable Pulse Generator Implant Date: 20250518

## 2023-12-02 NOTE — Patient Instructions (Addendum)
 Medication Instructions:  The current medical regimen is effective;  continue present plan and medications as directed. Please refer to the Current Medication list given to you today.   *If you need a refill on your cardiac medications before your next appointment, please call your pharmacy*  Follow-Up: At [facility], you and your health needs are our priority.  As part of our continuing mission to provide you with exceptional heart care, our providers are all part of one team.  This team includes your primary Cardiologist (physician) and Advanced Practice Providers or APPs (Physician Assistants and Nurse Practitioners) who all work together to provide you with the care you need, when you need it.  Your next appointment:   12 month(s)  Provider:   Eulas Furbish, MD or Suzann Riddle, NP    We recommend signing up for the patient portal called MyChart.  Sign up information is provided on this After Visit Summary.  MyChart is used to connect with patients for Virtual Visits (Telemedicine).  Patients are able to view lab/test results, encounter notes, upcoming appointments, etc.  Non-urgent messages can be sent to your provider as well.   To learn more about what you can do with MyChart, go to [email or web address].

## 2023-12-03 ENCOUNTER — Ambulatory Visit
Admission: RE | Admit: 2023-12-03 | Discharge: 2023-12-03 | Disposition: A | Discharge status: Home / Self Care | Source: Ambulatory Visit | Attending: Physician Assistant | Admitting: Physician Assistant

## 2023-12-03 DIAGNOSIS — N390 Urinary tract infection, site not specified: Secondary | ICD-10-CM | POA: Insufficient documentation

## 2023-12-06 ENCOUNTER — Ambulatory Visit: Payer: Self-pay | Admitting: Cardiovascular Disease

## 2023-12-09 ENCOUNTER — Inpatient Hospital Stay
Admission: EM | Admit: 2023-12-09 | Discharge: 2023-12-12 | DRG: 919 | Disposition: A | Discharge status: Home / Self Care | Attending: Student | Admitting: Student

## 2023-12-09 ENCOUNTER — Other Ambulatory Visit: Payer: Self-pay

## 2023-12-09 ENCOUNTER — Other Ambulatory Visit (HOSPITAL_COMMUNITY): Payer: Self-pay

## 2023-12-09 ENCOUNTER — Encounter: Payer: Self-pay | Admitting: *Deleted

## 2023-12-09 DIAGNOSIS — M549 Dorsalgia, unspecified: Secondary | ICD-10-CM | POA: Diagnosis not present

## 2023-12-09 DIAGNOSIS — Z803 Family history of malignant neoplasm of breast: Secondary | ICD-10-CM

## 2023-12-09 DIAGNOSIS — E1010 Type 1 diabetes mellitus with ketoacidosis without coma: Secondary | ICD-10-CM | POA: Diagnosis present

## 2023-12-09 DIAGNOSIS — Z79899 Other long term (current) drug therapy: Secondary | ICD-10-CM | POA: Diagnosis not present

## 2023-12-09 DIAGNOSIS — N179 Acute kidney failure, unspecified: Secondary | ICD-10-CM | POA: Diagnosis not present

## 2023-12-09 DIAGNOSIS — I10 Essential (primary) hypertension: Secondary | ICD-10-CM | POA: Diagnosis present

## 2023-12-09 DIAGNOSIS — E785 Hyperlipidemia, unspecified: Secondary | ICD-10-CM | POA: Diagnosis not present

## 2023-12-09 DIAGNOSIS — Z66 Do not resuscitate: Secondary | ICD-10-CM | POA: Diagnosis present

## 2023-12-09 DIAGNOSIS — H409 Unspecified glaucoma: Secondary | ICD-10-CM | POA: Diagnosis present

## 2023-12-09 DIAGNOSIS — Z794 Long term (current) use of insulin: Secondary | ICD-10-CM

## 2023-12-09 DIAGNOSIS — Z9221 Personal history of antineoplastic chemotherapy: Secondary | ICD-10-CM

## 2023-12-09 DIAGNOSIS — R739 Hyperglycemia, unspecified: Principal | ICD-10-CM

## 2023-12-09 DIAGNOSIS — R7989 Other specified abnormal findings of blood chemistry: Secondary | ICD-10-CM | POA: Diagnosis not present

## 2023-12-09 DIAGNOSIS — E1110 Type 2 diabetes mellitus with ketoacidosis without coma: Secondary | ICD-10-CM | POA: Diagnosis present

## 2023-12-09 DIAGNOSIS — E875 Hyperkalemia: Secondary | ICD-10-CM | POA: Diagnosis not present

## 2023-12-09 DIAGNOSIS — F0390 Unspecified dementia without behavioral disturbance: Secondary | ICD-10-CM | POA: Diagnosis present

## 2023-12-09 DIAGNOSIS — Z923 Personal history of irradiation: Secondary | ICD-10-CM | POA: Diagnosis not present

## 2023-12-09 DIAGNOSIS — Z85828 Personal history of other malignant neoplasm of skin: Secondary | ICD-10-CM

## 2023-12-09 DIAGNOSIS — R1111 Vomiting without nausea: Secondary | ICD-10-CM | POA: Diagnosis not present

## 2023-12-09 DIAGNOSIS — E1100 Type 2 diabetes mellitus with hyperosmolarity without nonketotic hyperglycemic-hyperosmolar coma (NKHHC): Secondary | ICD-10-CM

## 2023-12-09 DIAGNOSIS — T85614A Breakdown (mechanical) of insulin pump, initial encounter: Principal | ICD-10-CM | POA: Diagnosis present

## 2023-12-09 DIAGNOSIS — T383X6A Underdosing of insulin and oral hypoglycemic [antidiabetic] drugs, initial encounter: Secondary | ICD-10-CM | POA: Diagnosis present

## 2023-12-09 DIAGNOSIS — D638 Anemia in other chronic diseases classified elsewhere: Secondary | ICD-10-CM | POA: Diagnosis not present

## 2023-12-09 DIAGNOSIS — Y828 Other medical devices associated with adverse incidents: Secondary | ICD-10-CM | POA: Diagnosis present

## 2023-12-09 DIAGNOSIS — Z853 Personal history of malignant neoplasm of breast: Secondary | ICD-10-CM

## 2023-12-09 DIAGNOSIS — Z950 Presence of cardiac pacemaker: Secondary | ICD-10-CM

## 2023-12-09 DIAGNOSIS — E1065 Type 1 diabetes mellitus with hyperglycemia: Secondary | ICD-10-CM | POA: Diagnosis not present

## 2023-12-09 DIAGNOSIS — R112 Nausea with vomiting, unspecified: Secondary | ICD-10-CM | POA: Diagnosis not present

## 2023-12-09 LAB — CBG MONITORING, ED
Glucose-Capillary: >600 mg/dL (ref 70–99)
Glucose-Capillary: >600 mg/dL (ref 70–99)
Glucose-Capillary: >600 mg/dL (ref 70–99)
Glucose-Capillary: >600 mg/dL (ref 70–99)
Glucose-Capillary: >600 mg/dL (ref 70–99)
Glucose-Capillary: >600 mg/dL (ref 70–99)

## 2023-12-09 LAB — GLUCOSE, CAPILLARY
Glucose-Capillary: 392 mg/dL — ABNORMAL HIGH (ref 70–99)
Glucose-Capillary: 445 mg/dL — ABNORMAL HIGH (ref 70–99)
Glucose-Capillary: 447 mg/dL — ABNORMAL HIGH (ref 70–99)
Glucose-Capillary: 461 mg/dL — ABNORMAL HIGH (ref 70–99)
Glucose-Capillary: 473 mg/dL — ABNORMAL HIGH (ref 70–99)
Glucose-Capillary: 482 mg/dL — ABNORMAL HIGH (ref 70–99)
Glucose-Capillary: 526 mg/dL (ref 70–99)
Glucose-Capillary: 530 mg/dL (ref 70–99)
Glucose-Capillary: 539 mg/dL (ref 70–99)
Glucose-Capillary: >600 mg/dL (ref 70–99)
Glucose-Capillary: >600 mg/dL (ref 70–99)
Glucose-Capillary: >600 mg/dL (ref 70–99)
Glucose-Capillary: >600 mg/dL (ref 70–99)
Glucose-Capillary: >600 mg/dL (ref 70–99)
Glucose-Capillary: >600 mg/dL (ref 70–99)

## 2023-12-09 LAB — URINALYSIS, ROUTINE W REFLEX MICROSCOPIC
Bacteria, UA: NONE SEEN
Bilirubin Urine: NEGATIVE
Glucose, UA: >=500 mg/dL — AB
Hgb urine dipstick: NEGATIVE
Ketones, ur: 20 mg/dL — AB
Leukocytes,Ua: NEGATIVE
Nitrite: NEGATIVE
Protein, ur: NEGATIVE mg/dL
Specific Gravity, Urine: 1.017 (ref 1.005–1.030)
Squamous Epithelial / HPF: 0 /HPF (ref 0–5)
pH: 5 (ref 5.0–8.0)

## 2023-12-09 LAB — BLOOD GAS, VENOUS
Acid-base deficit: 23.7 mmol/L — ABNORMAL HIGH (ref 0.0–2.0)
Acid-base deficit: 2.7 mmol/L — ABNORMAL HIGH (ref 0.0–2.0)
Bicarbonate: 6.4 mmol/L — ABNORMAL LOW (ref 20.0–28.0)
Bicarbonate: 22.6 mmol/L (ref 20.0–28.0)
O2 Saturation: 72.5 %
O2 Saturation: 77.1 %
Patient temperature: 37
Patient temperature: 37
pCO2, Ven: 26 mmHg — ABNORMAL LOW (ref 44–60)
pCO2, Ven: 40 mmHg — ABNORMAL LOW (ref 44–60)
pH, Ven: 7 — CL (ref 7.25–7.43)
pH, Ven: 7.36 (ref 7.25–7.43)
pO2, Ven: 51 mmHg — ABNORMAL HIGH (ref 32–45)
pO2, Ven: 40 mmHg (ref 32–45)

## 2023-12-09 LAB — CBC
HCT: 36.3 % (ref 36.0–46.0)
Hemoglobin: 11 g/dL — ABNORMAL LOW (ref 12.0–15.0)
MCH: 32.6 pg (ref 26.0–34.0)
MCHC: 30.3 g/dL (ref 30.0–36.0)
MCV: 107.7 fL — ABNORMAL HIGH (ref 80.0–100.0)
Platelets: 267 K/uL (ref 150–400)
RBC: 3.37 MIL/uL — ABNORMAL LOW (ref 3.87–5.11)
RDW: 13.1 % (ref 11.5–15.5)
WBC: 12.7 K/uL — ABNORMAL HIGH (ref 4.0–10.5)
nRBC: 0 % (ref 0.0–0.2)

## 2023-12-09 LAB — BASIC METABOLIC PANEL WITH GFR
Anion gap: 26 — ABNORMAL HIGH (ref 5–15)
Anion gap: 11 (ref 5–15)
BUN: 56 mg/dL — ABNORMAL HIGH (ref 8–23)
BUN: 56 mg/dL — ABNORMAL HIGH (ref 8–23)
BUN: 53 mg/dL — ABNORMAL HIGH (ref 8–23)
CO2: <7 mmol/L — ABNORMAL LOW (ref 22–32)
CO2: 8 mmol/L — ABNORMAL LOW (ref 22–32)
CO2: 19 mmol/L — ABNORMAL LOW (ref 22–32)
Calcium: 9 mg/dL (ref 8.9–10.3)
Calcium: 8.6 mg/dL — ABNORMAL LOW (ref 8.9–10.3)
Calcium: 8.6 mg/dL — ABNORMAL LOW (ref 8.9–10.3)
Chloride: 93 mmol/L — ABNORMAL LOW (ref 98–111)
Chloride: 95 mmol/L — ABNORMAL LOW (ref 98–111)
Chloride: 101 mmol/L (ref 98–111)
Creatinine, Ser: 2.58 mg/dL — ABNORMAL HIGH (ref 0.44–1.00)
Creatinine, Ser: 2.67 mg/dL — ABNORMAL HIGH (ref 0.44–1.00)
Creatinine, Ser: 2.15 mg/dL — ABNORMAL HIGH (ref 0.44–1.00)
GFR, Estimated: 19 mL/min — ABNORMAL LOW (ref >60)
GFR, Estimated: 18 mL/min — ABNORMAL LOW (ref >60)
GFR, Estimated: 23 mL/min — ABNORMAL LOW (ref >60)
Glucose, Bld: 938 mg/dL (ref 70–99)
Glucose, Bld: 836 mg/dL (ref 70–99)
Glucose, Bld: 528 mg/dL (ref 70–99)
Potassium: 6.3 mmol/L (ref 3.5–5.1)
Potassium: 5.5 mmol/L — ABNORMAL HIGH (ref 3.5–5.1)
Potassium: 4.5 mmol/L (ref 3.5–5.1)
Sodium: 128 mmol/L — ABNORMAL LOW (ref 135–145)
Sodium: 129 mmol/L — ABNORMAL LOW (ref 135–145)
Sodium: 131 mmol/L — ABNORMAL LOW (ref 135–145)

## 2023-12-09 LAB — PHOSPHORUS: Phosphorus: 2.6 mg/dL (ref 2.5–4.6)

## 2023-12-09 LAB — MRSA NEXT GEN BY PCR, NASAL: MRSA by PCR Next Gen: NOT DETECTED

## 2023-12-09 LAB — COMPREHENSIVE METABOLIC PANEL WITH GFR
ALT: 35 U/L (ref 0–44)
AST: 55 U/L — ABNORMAL HIGH (ref 15–41)
Albumin: 3.9 g/dL (ref 3.5–5.0)
Alkaline Phosphatase: 100 U/L (ref 38–126)
BUN: 61 mg/dL — ABNORMAL HIGH (ref 8–23)
CO2: <7 mmol/L — ABNORMAL LOW (ref 22–32)
Calcium: 9.2 mg/dL (ref 8.9–10.3)
Chloride: 87 mmol/L — ABNORMAL LOW (ref 98–111)
Creatinine, Ser: 2.84 mg/dL — ABNORMAL HIGH (ref 0.44–1.00)
GFR, Estimated: 17 mL/min — ABNORMAL LOW (ref >60)
Glucose, Bld: 1045 mg/dL (ref 70–99)
Potassium: 6.7 mmol/L (ref 3.5–5.1)
Sodium: 122 mmol/L — ABNORMAL LOW (ref 135–145)
Total Bilirubin: 1.4 mg/dL — ABNORMAL HIGH (ref 0.0–1.2)
Total Protein: 6.7 g/dL (ref 6.5–8.1)

## 2023-12-09 LAB — MAGNESIUM: Magnesium: 1.7 mg/dL (ref 1.7–2.4)

## 2023-12-09 LAB — BETA-HYDROXYBUTYRIC ACID
Beta-Hydroxybutyric Acid: >8 mmol/L — ABNORMAL HIGH (ref 0.05–0.27)
Beta-Hydroxybutyric Acid: 0.96 mmol/L — ABNORMAL HIGH (ref 0.05–0.27)

## 2023-12-09 MED ORDER — CALCIUM CARBONATE 1250 (500 CA) MG PO TABS
500.0000 mg | ORAL_TABLET | Freq: Every day | ORAL | Status: DC
  Administered 2023-12-11 – 2023-12-12 (×2): 1250 mg via ORAL
  Filled 2023-12-09 (×2): qty 1

## 2023-12-09 MED ORDER — LOSARTAN POTASSIUM 25 MG PO TABS
25.0000 mg | ORAL_TABLET | Freq: Every day | ORAL | Status: DC
  Administered 2023-12-10 – 2023-12-12 (×3): 25 mg via ORAL
  Filled 2023-12-09 (×3): qty 1

## 2023-12-09 MED ORDER — LACTATED RINGERS IV BOLUS
1000.0000 mL | Freq: Once | INTRAVENOUS | Status: AC
  Administered 2023-12-09: 1000 mL via INTRAVENOUS

## 2023-12-09 MED ORDER — ONDANSETRON HCL 4 MG/2ML IJ SOLN
4.0000 mg | Freq: Once | INTRAMUSCULAR | Status: AC
  Administered 2023-12-09: 4 mg via INTRAVENOUS
  Filled 2023-12-09: qty 2

## 2023-12-09 MED ORDER — CHLORHEXIDINE GLUCONATE CLOTH 2 % EX PADS
6.0000 | MEDICATED_PAD | Freq: Every day | CUTANEOUS | Status: DC
  Administered 2023-12-11 – 2023-12-12 (×2): 6 via TOPICAL

## 2023-12-09 MED ORDER — ONDANSETRON HCL 4 MG PO TABS: 4.0000 mg | ORAL_TABLET | Freq: Four times a day (QID) | ORAL | Status: DC | PRN

## 2023-12-09 MED ORDER — SODIUM CHLORIDE 0.9 % IV BOLUS
1000.0000 mL | Freq: Once | INTRAVENOUS | Status: AC
  Administered 2023-12-09: 1000 mL via INTRAVENOUS

## 2023-12-09 MED ORDER — SODIUM CHLORIDE 0.9 % IV SOLN: INTRAVENOUS | Status: DC

## 2023-12-09 MED ORDER — LACTATED RINGERS IV SOLN: INTRAVENOUS | Status: DC

## 2023-12-09 MED ORDER — MAGNESIUM SULFATE 2 GM/50ML IV SOLN
2.0000 g | Freq: Once | INTRAVENOUS | Status: AC
  Administered 2023-12-10: 2 g via INTRAVENOUS
  Filled 2023-12-09: qty 50

## 2023-12-09 MED ORDER — VITAMIN D 25 MCG (1000 UNIT) PO TABS
2000.0000 [IU] | ORAL_TABLET | Freq: Every day | ORAL | Status: DC
  Administered 2023-12-09 – 2023-12-12 (×4): 2000 [IU] via ORAL
  Filled 2023-12-09 (×4): qty 2

## 2023-12-09 MED ORDER — HEPARIN SODIUM (PORCINE) 5000 UNIT/ML IJ SOLN
5000.0000 [IU] | Freq: Three times a day (TID) | INTRAMUSCULAR | Status: DC
  Administered 2023-12-09 – 2023-12-12 (×9): 5000 [IU] via SUBCUTANEOUS
  Filled 2023-12-09 (×9): qty 1

## 2023-12-09 MED ORDER — ATORVASTATIN CALCIUM 10 MG PO TABS
10.0000 mg | ORAL_TABLET | Freq: Every day | ORAL | Status: DC
  Administered 2023-12-09 – 2023-12-12 (×4): 10 mg via ORAL
  Filled 2023-12-09 (×4): qty 1

## 2023-12-09 MED ORDER — DEXTROSE IN LACTATED RINGERS 5 % IV SOLN: INTRAVENOUS | Status: DC

## 2023-12-09 MED ORDER — ACETAMINOPHEN 325 MG PO TABS
650.0000 mg | ORAL_TABLET | Freq: Four times a day (QID) | ORAL | Status: DC | PRN
  Administered 2023-12-09: 650 mg via ORAL
  Filled 2023-12-09: qty 2

## 2023-12-09 MED ORDER — INSULIN REGULAR(HUMAN) IN NACL 100-0.9 UT/100ML-% IV SOLN
INTRAVENOUS | Status: DC
  Administered 2023-12-09: 5.5 [IU]/h via INTRAVENOUS
  Administered 2023-12-09: 11 [IU]/h via INTRAVENOUS
  Filled 2023-12-09 (×2): qty 100

## 2023-12-09 MED ORDER — DEXTROSE 50 % IV SOLN: 0.0000 mL | INTRAVENOUS | Status: DC | PRN

## 2023-12-09 MED ORDER — POLYETHYLENE GLYCOL 3350 17 G PO PACK: 17.0000 g | PACK | Freq: Every day | ORAL | Status: DC | PRN

## 2023-12-09 MED ORDER — ACETAMINOPHEN 650 MG RE SUPP: 650.0000 mg | Freq: Four times a day (QID) | RECTAL | Status: DC | PRN

## 2023-12-09 MED ORDER — ONDANSETRON HCL 4 MG/2ML IJ SOLN: 4.0000 mg | Freq: Four times a day (QID) | INTRAMUSCULAR | Status: DC | PRN

## 2023-12-09 NOTE — ED Notes (Signed)
 Pt had a large BM and voided in toilet, urine was not collected.

## 2023-12-09 NOTE — ED Notes (Signed)
 Call to ICU, they will call once they are able to take pt

## 2023-12-09 NOTE — ED Provider Notes (Addendum)
 [facility] Provider Note    Event Date/Time   First MD Initiated Contact with Patient 12/09/23 0935     (approximate)  History   Chief Complaint: Hyperglycemia  HPI  Alejandra Lowe is a 78 y.o. female with a past medical history of diabetes, hypertension, hyperlipidemia, presents to the emergency department for increased fatigue weakness found to be significantly hyperglycemic (500) by EMS.  Patient has an insulin  pump but it is not attached, it is not clear if she has been using her insulin  at home.  Patient states she has not been checking her blood sugar very often.  States she has been very weak for the past month and has not been essentially getting out of bed states her husband cares for her but for the past several days has been even more so weak and fatigued.  Patient has been nauseated with vomiting this morning.  Denies any abdominal pain.  Physical Exam   Triage Vital Signs: ED Triage Vitals [12/09/23 0934]  Encounter Vitals Group     BP      Girls Systolic BP Percentile      Girls Diastolic BP Percentile      Boys Systolic BP Percentile      Boys Diastolic BP Percentile      Pulse      Resp      Temp      Temp src      SpO2      Weight      Height      Head Circumference      Peak Flow      Pain Score 0     Pain Loc      Pain Education      Exclude from Growth Chart     Most recent vital signs: There were no vitals filed for this visit.  General: Awake, no distress.  CV:  Good peripheral perfusion.  Regular rate and rhythm  Resp:  Normal effort.  Equal breath sounds bilaterally.  Abd:  No distention.  Soft, nontender.  No rebound or guarding.  ED Results / Procedures / Treatments   MEDICATIONS ORDERED IN ED: Medications  sodium chloride  0.9 % bolus 1,000 mL (has no administration in time range)  ondansetron  (ZOFRAN ) injection 4 mg (has no administration in time range)     IMPRESSION / MDM / ASSESSMENT AND PLAN / ED COURSE   I reviewed the triage vital signs and the nursing notes.  Patient's presentation is most consistent with acute presentation with potential threat to life or bodily function.  Patient presents to the emergency department for increased fatigue weakness nausea vomiting found to be hyperglycemic to 500 per EMS.  Patient does have dry appearing mucous membranes.  Insulin  pump is present but is not attached.  We will check urinalysis, labs including a VBG and beta hydroxybutyric acid.  We will IV hydrate treat nausea and continue to closely monitor while awaiting results.  Differential would include electrolyte or metabolic abnormality, dehydration, renal dysfunction, DKA or HHS, infectious etiology.  Patient's insulin  pump was present and connected however the line was kinked and it is not clear how long the patient has been without insulin .  Patient's labs have resulted showing concern for HHS/DKA.  CBC shows a white blood cell count of 12.7, chemistry shows significant hyperglycemia with a glucose of 1045 elevated creatinine to 2.8 as well as a significant hyperkalemia at 6.7 and pseudohyponatremia at 122.  No significant  EKG changes associated with the patient's hyperkalemia which showed drop quickly once the insulin  infusion is begun and patient is receiving IV fluids already.  Beta hydroxybutyric acid greater than 8.  pH of 7.  Patient remains awake alert and mentating.  Patient is receiving her second liter of IV fluids and an insulin  infusion has been ordered for the patient.  Will discuss with the hospitalist for admission.  Patient agreeable to plan of care.   CRITICAL CARE Performed by: Franky Moores   Total critical care time: 30 minutes  Critical care time was exclusive of separately billable procedures and treating other patients.  Critical care was necessary to treat or prevent imminent or life-threatening deterioration.  Critical care was time spent personally by me on the  following activities: development of treatment plan with patient and/or surrogate as well as nursing, discussions with consultants, evaluation of patient's response to treatment, examination of patient, obtaining history from patient or surrogate, ordering and performing treatments and interventions, ordering and review of laboratory studies, ordering and review of radiographic studies, pulse oximetry and re-evaluation of patient's condition.   FINAL CLINICAL IMPRESSION(S) / ED DIAGNOSES   Hyperglycemia Nausea vomiting HHS DKA  Note:  This document was prepared using Dragon voice recognition software and may include unintentional dictation errors.   Moores Franky, MD 12/09/23 1112    Moores Franky, MD 12/09/23 1121

## 2023-12-09 NOTE — ED Notes (Signed)
 Pt has an insulin  pump attached to her which she came with but pt is not able to advise on it and it does not appear to be infusing. Pt also has a freestyle libre meter taht is malfunctioning but spouse shows me readings which are as follows: 8/26 @ 0906 = 251, 8/26@ 1530 = 252, 8/27 @ 0319 =257, 8/27@ 1013=HI and that is the last reading.

## 2023-12-09 NOTE — Inpatient Diabetes Management (Addendum)
 Inpatient Diabetes Program Recommendations  AACE/ADA: New Consensus Statement on Inpatient Glycemic Control   Target Ranges:  Prepandial:   less than 140 mg/dL      Peak postprandial:   less than 180 mg/dL (1-2 hours)      Critically ill patients:  140 - 180 mg/dL    Latest Reference Range & Units 12/09/23 09:44  Glucose-Capillary 70 - 99 mg/dL >399 (HH)   Review of Glycemic Control  Diabetes history: DM1 Outpatient Diabetes medications: Medtronic 630 G insulin  pump (basal 12A 0.5 units/hr; 12P 0.7 units/hr; total daily basal 14.4 units; I:CR 1:10 grams, I:SF 1:30 mg/dl) Current orders for Inpatient glycemic control: None; in ED  Inpatient Diabetes Program Recommendations:    Insulin : CBG >600 at 9:44 am and labs in process. If patient is in DKA, please order IV insulin  per DKA order set.  NOTE: Patient in ED via EMS with reports of feeling sick for a few days and CBG over 500 x2 day. In reviewing chart, noted patient sees Dr. Cherilyn with [facility] and was seen on 08/25/23. Per office note on 08/25/23 patient is using Medtronic 630G insulin  pump and the following should be insulin  pump settings:  Basal Rates 12A 0.5 units/hr 12P 0.7 units/hr Total Daily basal: 14.4 units/24 hours Insulin  to Carb Ratio 1:10 grams Insulin  Sensitivity Factor 1:30 mg/dl  Target glucose 19-879 mg/dl  It was noted on 1/81/74: Pts husband called. Worried about mental function. Can't remember if she has bolused so is bolusing a second time and getting low. Also, pulling out sensors when out of range of reader and then sees that it has disconnected. Going to be a month short of sensors this month. Told to make an appt to discuss. Consider showing him how to use Block Mode to keep her from bolusing.  Addendum 12/09/23@11 :20-Spoke with patient and her husband at bedside regarding DM control. Patient was alert but confused; not able to answer any questions regarding her insulin  pump, infusion site,  etc.  Patient's husband reports that patient's glucose has been running high for a couple of days; patient has been doing boluses of insulin  but glucose has remained elevated. Patient already removed insulin  pump and patient's husband has it. Reviewed insulin  pump settings which are exactly as noted above. Insulin  infusion site still attached to insulin  pump tubing and infusion site catheter was kinked which is likely cause of DKA. Patient is not sure when she last changed out the infusion site. Patient's husband reports that he does not know how to change her site or operate her insulin  pump. Patient's husband reports that patient has been more confused lately and he is not sure that she is managing her insulin  pump or DM well.  Discussed that when an infusion site is changed out and glucose starts to increase and does not respond to boluses of insulin  via pump, they need to consider that the infusion site may be kinked and not delivering insulin  correctly. When that happens, they would need to take SQ insulin  injection, remove infusion site and apply a new infusion site.   Question if patient is appropriate to continue to use the insulin  pump since she is having confusion (husband reports patient has been more confused over past several weeks). Discussed DKA, basic pathophysiology, and treatment with IV insulin . Discussed that once DKA is cleared with IV insulin  then patient would transition to SQ insulin  regimen. Discussed that it may be safer for patient to be discharged on SQ insulin  regimen versus  going back on insulin  pump due to confusion over past several weeks. Recommended that patient and her husband follow up with Dr. Cherilyn to decide if it is appropriate for patient to resume her insulin  pump; if so, would recommend that patient's husband be educated on how to use the insulin  pump, fill insulin  reservoir, change tubing, insert a new infusion site, and how to bolus insulin  based on glucose and  carbohydrates.  Patient's husband reports that patient only has Novolog  insulin  at home (no basal insulin ).  Will have outpatient TOC pharmacy to check and see which basal insulin  is covered.  Patient reports he could assist patient with insulin  injections at home if needed. Patient and husband have no questions at this time. Inpatient diabetes team to follow up with patient and her husband tomorrow.  Thanks, Earnie Gainer, RN, MSN, CDCES Diabetes Coordinator Inpatient Diabetes Program [phone] (Team Pager from 8am to 5pm)

## 2023-12-09 NOTE — ED Triage Notes (Signed)
 Pt is here from home where she lives with her husband.  Per ems pt is type 1 diabetic and she has been feeling sick for a few days.  CBG over 500 for the past 2 days.  Pt vomited with ems.

## 2023-12-09 NOTE — H&P (Signed)
 History and Physical    BEE MARCHIANO FMW:[id number] DOB: 05-21-1945 DOA: 12/09/2023  DOS: the patient was seen and examined on 12/09/2023  PCP: Lenon Layman ORN, MD   Patient coming from: Home  I have personally briefly reviewed patient's old medical records in [facility]  Chief Complaint: Feeling sick, vomiting, high blood sugar  HPI: Alejandra Lowe is a pleasant 78 y.o. female with medical history significant for diabetes, HLD, HTN, dementia who presented to ED for feeling sick, nauseated, vomiting with high blood sugars.  Patient has insulin  pump which has not been attached, it was unclear whether she was using insulin  pump at home or not.  It appears that patient has dementia and she does not remember when I asked her questions.  Patient's husband was at bedside and when I asked he said he has not helped her with the insulin  she does herself.  He said that the blood sugar indicated high and high and she was confused and she was weak and she vomited once and he called EMS.  Patient denies any abdominal pain, fever, chest pain or shortness of breath.  ED Course: Upon arrival to the ED, patient is found to be hyperglycemic blood sugar around 500 by EMS.  Blood sugars were 1045, potassium 6.7, sodium 122, creatinine 2.84 BUN 61 white count 12.7 beta hydroxybutyrate more than 8.  Patient was started on IV fluid and insulin  drip.  Hospitalist service was consulted for evaluation for admission for DKA.  Review of Systems:  ROS  All other systems negative except as noted in the HPI.  Past Medical History:  Diagnosis Date   Arthritis    Basal cell carcinoma 05/27/2020   R upper back - ED&C   Breast cancer (HCC) 1997   left breast, radiation, chemo   Diabetes mellitus without complication (HCC)    Dysplastic nevus 02/24/2021   Left distal ant thigh   Glaucoma    Hyperlipidemia    Hypertension    Personal history of chemotherapy 1997   BREAST CA   Personal history of  radiation therapy 1997   BREAST CA   Seizure (HCC)     Past Surgical History:  Procedure Laterality Date   BREAST EXCISIONAL BIOPSY Left 1997   positive   BREAST LUMPECTOMY Left 1997   W/ radiation   PACEMAKER IMPLANT N/A 08/29/2023   Procedure: PACEMAKER IMPLANT;  Surgeon: Nancey Eulas BRAVO, MD;  Location: [facility] INVASIVE CV LAB;  Service: Cardiovascular;  Laterality: N/A;   ROTATOR CUFF REPAIR     TEMPORARY PACEMAKER N/A 08/26/2023   Procedure: TEMPORARY PACEMAKER;  Surgeon: Anner Alm ORN, MD;  Location: [facility] INVASIVE CV LAB;  Service: Cardiovascular;  Laterality: N/A;     reports that she has never smoked. She has never used smokeless tobacco. She reports current alcohol use of about 7.0 standard drinks of alcohol per week. She reports that she does not use drugs.  No Known Allergies  Family History  Problem Relation Age of Onset   Breast cancer Mother 32   Breast cancer Sister 45    Prior to Admission medications   Medication Sig Start Date End Date Taking? Authorizing Provider  acetaminophen  (TYLENOL ) 325 MG tablet Take 2 tablets (650 mg total) by mouth every 6 (six) hours as needed for mild pain (pain score 1-3). 08/30/23  Yes Lesia Ozell Barter, PA-C  atorvastatin  (LIPITOR) 10 MG tablet Take 10 mg by mouth daily.   Yes [provider]  calcium  carbonate (  OSCAL) 1500 (600 Ca) MG TABS tablet Take 600 mg of elemental calcium  by mouth daily with breakfast.   Yes [provider]  Cholecalciferol  (VITAMIN D ) 50 MCG (2000 UT) CAPS Take 2,000 Units by mouth daily.   Yes [provider]  donepezil (ARICEPT) 5 MG tablet Take 5 mg by mouth at bedtime.   Yes [provider]  Glucagon (GVOKE HYPOPEN 2-PACK) 1 MG/0.2ML SOAJ Inject 1 mg into the skin once as needed (allergic reaction).   Yes [provider]  insulin  aspart (NOVOLOG ) 100 UNIT/ML injection Inject 0-9 Units into the skin every 4 (four) hours. 08/26/23  Yes Nelson, Dana G, NP   losartan  (COZAAR ) 25 MG tablet Take 1 tablet by mouth daily. 06/23/23 06/22/24 Yes [provider]  nitrofurantoin , macrocrystal-monohydrate, (MACROBID ) 100 MG capsule Take 1 capsule (100 mg total) by mouth daily. 12/01/23  Yes Vaillancourt, Samantha, PA-C  triamcinolone cream (KENALOG) 0.1 % Apply 1 Application topically 2 (two) times daily as needed (itching).   Yes [provider]  vitamin E  180 MG (400 UNITS) capsule Take 400 Units by mouth daily.   Yes [provider]  Continuous Glucose Receiver (FREESTYLE LIBRE 14 DAY READER) DEVI 1 kit by Does not apply route as directed.    [provider]  Continuous Glucose Sensor (FREESTYLE LIBRE 14 DAY SENSOR) MISC 1 each by Does not apply route every 14 (fourteen) days.    [provider]  glucose blood (PRECISION QID TEST) test strip 1 strip.  1 each (1 strip total) 3 (three) times daily Use as instructed. 12/08/23 12/07/24  [provider]    Physical Exam: Vitals:   12/09/23 1100 12/09/23 1130 12/09/23 1221 12/09/23 1230  BP: (!) 111/43 (!) 103/30 (!) 104/49 (!) 107/34  Pulse:      Resp: 13 16 20 12   Temp:    97.6 F (36.4 C)  TempSrc:    Oral  SpO2:        Physical Exam   Constitutional: Alert, awake, calm, comfortable HEENT: Neck supple Respiratory: Clear to auscultation B/L, no wheezing, no rales.  Cardiovascular: Regular rate and rhythm, no murmurs / rubs / gallops. No extremity edema. 2+ pedal pulses. No carotid bruits.  Abdomen: Soft, no tenderness, Bowel sounds positive.  Musculoskeletal: no clubbing / cyanosis. Good ROM, no contractures. Normal muscle tone.  Skin: no rashes, lesions, ulcers. Neurologic: CN 2-12 grossly intact. Sensation intact, No focal deficit identified Psychiatric: Alert and and pleasantly confused. Normal mood.    Labs on Admission: I have personally reviewed following labs and imaging studies  CBC: Recent Labs  Lab 12/09/23 0945  WBC 12.7*  HGB  11.0*  HCT 36.3  MCV 107.7*  PLT 267   Basic Metabolic Panel: Recent Labs  Lab 12/09/23 0945  NA 122*  K 6.7*  CL 87*  CO2 <7*  GLUCOSE 1,045*  BUN 61*  CREATININE 2.84*  CALCIUM  9.2   GFR: Estimated Creatinine Clearance: 16.1 mL/min (A) (by C-G formula based on SCr of 2.84 mg/dL (H)). Liver Function Tests: Recent Labs  Lab 12/09/23 0945  AST 55*  ALT 35  ALKPHOS 100  BILITOT 1.4*  PROT 6.7  ALBUMIN 3.9   No results for input(s): LIPASE, AMYLASE in the last 168 hours. No results for input(s): AMMONIA in the last 168 hours. Coagulation Profile: No results for input(s): INR, PROTIME in the last 168 hours. Cardiac Enzymes: No results for input(s): CKTOTAL, CKMB, CKMBINDEX, TROPONINI, TROPONINIHS in the last 168 hours.  BNP (last 3 results) Recent Labs    08/26/23 1554  BNP 710.2*   HbA1C: No results for input(s): HGBA1C in the last 72 hours. CBG: Recent Labs  Lab 12/09/23 0944 12/09/23 1018 12/09/23 1128 12/09/23 1211 12/09/23 1245  GLUCAP >600* >600* >600* >600* >600*   Lipid Profile: No results for input(s): CHOL, HDL, LDLCALC, TRIG, CHOLHDL, LDLDIRECT in the last 72 hours. Thyroid  Function Tests: No results for input(s): TSH, T4TOTAL, FREET4, T3FREE, THYROIDAB in the last 72 hours. Anemia Panel: No results for input(s): VITAMINB12, FOLATE, FERRITIN, TIBC, IRON, RETICCTPCT in the last 72 hours. Urine analysis:    Component Value Date/Time   COLORURINE YELLOW (A) 05/04/2023 1500   APPEARANCEUR Slightly cloudy 11/24/2023 1416   LABSPEC 1.026 05/04/2023 1500   PHURINE 5.0 05/04/2023 1500   GLUCOSEU Trace (A) 11/24/2023 1416   HGBUR NEGATIVE 05/04/2023 1500   BILIRUBINUR Negative 11/24/2023 1416   KETONESUR 5 (A) 05/04/2023 1500   PROTEINUR 1+ (A) 11/24/2023 1416   PROTEINUR NEGATIVE 05/04/2023 1500   NITRITE Positive (A) 11/24/2023 1416   NITRITE NEGATIVE 05/04/2023 1500   LEUKOCYTESUR 2+  (A) 11/24/2023 1416   LEUKOCYTESUR NEGATIVE 05/04/2023 1500    Radiological Exams on Admission: I have personally reviewed images No results found.  EKG: My personal interpretation of EKG shows:     Assessment/Plan Principal Problem:   DKA (diabetic ketoacidosis) (HCC) Active Problems:   HTN (hypertension)   Dementia without behavioral disturbance (HCC)    Assessment and Plan:  78 year old female/PMH of DM, HTN, HLD, dementia who was brought in by ambulance when she was found to have blood sugar high, confused, nausea vomiting and had weakness at home.  It appears that she has a DKA.  1.  Acute diabetic ketoacidosis - It appears that her insulin  pump was not working. - Patient appears to be demented and husband does not have a clue whether she was taking insulin  or not. - It appears that there was some discrepancy between using insulin  or forgetting to use insulin . - She will be admitted to hospital as inpatient in the stepdown unit. - She has been started on insulin  drip, received IV fluid 1 L in the ED, second liter is going, will continue maintenance fluid with normal saline 125 cc/h - Continue insulin  drip - She will be n.p.o. until ketoacidosis improves - BMP for insulin  drip DKA protocol  2.  Hyperkalemia - There is no tented T waves in EKG - This is related to DKA - Once diabetes is well-controlled, it will get corrected - Will continue to monitor potassium  3.  Pseudohyponatremia - Due to hyperglycemia - Continue to monitor  4.  HTN - Blood pressure fairly controlled - Continue to monitor blood pressure - Continue Cozaar   5.  Hyperlipidemia - Continue statin  6.  Dementia - She takes donepezil at home - Will hold off that medication while she is in the hospital - Continue supportive care  7.  AKI - Due to DKA likely prerenal - Continue IV fluid - Continue to monitor kidney function   DVT prophylaxis: SQ Heparin  Code Status: DNR/DNI(Do NOT  Intubate) Family Communication: Husband was at bedside and confirmed that she is to DO NOT RESUSCITATE Disposition Plan: Home Consults called: None Admission status: Inpatient, Step Down Unit   Nena Rebel, MD [facility] 12/09/2023, 1:10 PM

## 2023-12-09 NOTE — Plan of Care (Signed)
  Problem: Education: Goal: Knowledge of General Education information will improve Description: Including pain rating scale, medication(s)/side effects and non-pharmacologic comfort measures Outcome: Progressing   Problem: Health Behavior/Discharge Planning: Goal: Ability to manage health-related needs will improve Outcome: Progressing   Problem: Clinical Measurements: Goal: Ability to maintain clinical measurements within normal limits will improve Outcome: Progressing Goal: Will remain free from infection Outcome: Progressing Goal: Respiratory complications will improve Outcome: Progressing Goal: Cardiovascular complication will be avoided Outcome: Progressing   Problem: Activity: Goal: Risk for activity intolerance will decrease Outcome: Progressing   Problem: Nutrition: Goal: Adequate nutrition will be maintained Outcome: Progressing   Problem: Coping: Goal: Level of anxiety will decrease Outcome: Progressing   Problem: Elimination: Goal: Will not experience complications related to urinary retention Outcome: Progressing   Problem: Pain Managment: Goal: General experience of comfort will improve and/or be controlled Outcome: Progressing   Problem: Safety: Goal: Ability to remain free from injury will improve Outcome: Progressing   Problem: Skin Integrity: Goal: Risk for impaired skin integrity will decrease Outcome: Progressing

## 2023-12-09 NOTE — ED Notes (Signed)
 Pt helped to the bathroom to void in the room

## 2023-12-09 NOTE — ED Notes (Addendum)
 CRITICAL VALUE STICKER  CRITICAL VALUE: Glucose 1045 and K 6.7  RECEIVER (on-site recipient of call): Leeroy Barnacle  DATE & TIME NOTIFIED: 12/09/2023 1042  MD NOTIFIED:  Dorothyann MD  TIME OF NOTIFICATION: [phone]

## 2023-12-10 ENCOUNTER — Other Ambulatory Visit (HOSPITAL_COMMUNITY): Payer: Self-pay

## 2023-12-10 ENCOUNTER — Telehealth (HOSPITAL_COMMUNITY): Payer: Self-pay

## 2023-12-10 DIAGNOSIS — E1010 Type 1 diabetes mellitus with ketoacidosis without coma: Secondary | ICD-10-CM | POA: Diagnosis not present

## 2023-12-10 LAB — BASIC METABOLIC PANEL WITH GFR
Anion gap: 8 (ref 5–15)
Anion gap: 6 (ref 5–15)
Anion gap: 6 (ref 5–15)
Anion gap: 9 (ref 5–15)
BUN: 49 mg/dL — ABNORMAL HIGH (ref 8–23)
BUN: 48 mg/dL — ABNORMAL HIGH (ref 8–23)
BUN: 42 mg/dL — ABNORMAL HIGH (ref 8–23)
BUN: 41 mg/dL — ABNORMAL HIGH (ref 8–23)
CO2: 21 mmol/L — ABNORMAL LOW (ref 22–32)
CO2: 22 mmol/L (ref 22–32)
CO2: 24 mmol/L (ref 22–32)
CO2: 22 mmol/L (ref 22–32)
Calcium: 8.6 mg/dL — ABNORMAL LOW (ref 8.9–10.3)
Calcium: 8.6 mg/dL — ABNORMAL LOW (ref 8.9–10.3)
Calcium: 8.6 mg/dL — ABNORMAL LOW (ref 8.9–10.3)
Calcium: 8.8 mg/dL — ABNORMAL LOW (ref 8.9–10.3)
Chloride: 104 mmol/L (ref 98–111)
Chloride: 107 mmol/L (ref 98–111)
Chloride: 107 mmol/L (ref 98–111)
Chloride: 106 mmol/L (ref 98–111)
Creatinine, Ser: 1.78 mg/dL — ABNORMAL HIGH (ref 0.44–1.00)
Creatinine, Ser: 1.63 mg/dL — ABNORMAL HIGH (ref 0.44–1.00)
Creatinine, Ser: 1.29 mg/dL — ABNORMAL HIGH (ref 0.44–1.00)
Creatinine, Ser: 1.37 mg/dL — ABNORMAL HIGH (ref 0.44–1.00)
GFR, Estimated: 29 mL/min — ABNORMAL LOW (ref >60)
GFR, Estimated: 32 mL/min — ABNORMAL LOW (ref >60)
GFR, Estimated: 43 mL/min — ABNORMAL LOW (ref >60)
GFR, Estimated: 40 mL/min — ABNORMAL LOW (ref >60)
Glucose, Bld: 280 mg/dL — ABNORMAL HIGH (ref 70–99)
Glucose, Bld: 196 mg/dL — ABNORMAL HIGH (ref 70–99)
Glucose, Bld: 198 mg/dL — ABNORMAL HIGH (ref 70–99)
Glucose, Bld: 215 mg/dL — ABNORMAL HIGH (ref 70–99)
Potassium: 3.8 mmol/L (ref 3.5–5.1)
Potassium: 3.9 mmol/L (ref 3.5–5.1)
Potassium: 4.5 mmol/L (ref 3.5–5.1)
Potassium: 4.3 mmol/L (ref 3.5–5.1)
Sodium: 133 mmol/L — ABNORMAL LOW (ref 135–145)
Sodium: 135 mmol/L (ref 135–145)
Sodium: 137 mmol/L (ref 135–145)
Sodium: 137 mmol/L (ref 135–145)

## 2023-12-10 LAB — HEPATIC FUNCTION PANEL
ALT: 50 U/L — ABNORMAL HIGH (ref 0–44)
AST: 79 U/L — ABNORMAL HIGH (ref 15–41)
Albumin: 3.1 g/dL — ABNORMAL LOW (ref 3.5–5.0)
Alkaline Phosphatase: 48 U/L (ref 38–126)
Bilirubin, Direct: <0.1 mg/dL (ref 0.0–0.2)
Total Bilirubin: 0.6 mg/dL (ref 0.0–1.2)
Total Protein: 5.2 g/dL — ABNORMAL LOW (ref 6.5–8.1)

## 2023-12-10 LAB — CBC
HCT: 27.9 % — ABNORMAL LOW (ref 36.0–46.0)
Hemoglobin: 9.5 g/dL — ABNORMAL LOW (ref 12.0–15.0)
MCH: 32.1 pg (ref 26.0–34.0)
MCHC: 34.1 g/dL (ref 30.0–36.0)
MCV: 94.3 fL (ref 80.0–100.0)
Platelets: 196 K/uL (ref 150–400)
RBC: 2.96 MIL/uL — ABNORMAL LOW (ref 3.87–5.11)
RDW: 12.6 % (ref 11.5–15.5)
WBC: 12.2 K/uL — ABNORMAL HIGH (ref 4.0–10.5)
nRBC: 0 % (ref 0.0–0.2)

## 2023-12-10 LAB — GLUCOSE, CAPILLARY
Glucose-Capillary: 132 mg/dL — ABNORMAL HIGH (ref 70–99)
Glucose-Capillary: 142 mg/dL — ABNORMAL HIGH (ref 70–99)
Glucose-Capillary: 142 mg/dL — ABNORMAL HIGH (ref 70–99)
Glucose-Capillary: 152 mg/dL — ABNORMAL HIGH (ref 70–99)
Glucose-Capillary: 155 mg/dL — ABNORMAL HIGH (ref 70–99)
Glucose-Capillary: 161 mg/dL — ABNORMAL HIGH (ref 70–99)
Glucose-Capillary: 163 mg/dL — ABNORMAL HIGH (ref 70–99)
Glucose-Capillary: 180 mg/dL — ABNORMAL HIGH (ref 70–99)
Glucose-Capillary: 183 mg/dL — ABNORMAL HIGH (ref 70–99)
Glucose-Capillary: 185 mg/dL — ABNORMAL HIGH (ref 70–99)
Glucose-Capillary: 196 mg/dL — ABNORMAL HIGH (ref 70–99)
Glucose-Capillary: 218 mg/dL — ABNORMAL HIGH (ref 70–99)
Glucose-Capillary: 231 mg/dL — ABNORMAL HIGH (ref 70–99)
Glucose-Capillary: 257 mg/dL — ABNORMAL HIGH (ref 70–99)
Glucose-Capillary: 283 mg/dL — ABNORMAL HIGH (ref 70–99)
Glucose-Capillary: 357 mg/dL — ABNORMAL HIGH (ref 70–99)

## 2023-12-10 LAB — VITAMIN B12: Vitamin B-12: 773 pg/mL (ref 180–914)

## 2023-12-10 LAB — BETA-HYDROXYBUTYRIC ACID
Beta-Hydroxybutyric Acid: 0.08 mmol/L (ref 0.05–0.27)
Beta-Hydroxybutyric Acid: 0.24 mmol/L (ref 0.05–0.27)

## 2023-12-10 LAB — IRON AND TIBC
Iron: 47 ug/dL (ref 28–170)
Saturation Ratios: 20 % (ref 10.4–31.8)
TIBC: 231 ug/dL — ABNORMAL LOW (ref 250–450)
UIBC: 184 ug/dL

## 2023-12-10 LAB — MAGNESIUM: Magnesium: 2.2 mg/dL (ref 1.7–2.4)

## 2023-12-10 LAB — FOLATE: Folate: 9.8 ng/mL (ref >5.9)

## 2023-12-10 LAB — VITAMIN D 25 HYDROXY (VIT D DEFICIENCY, FRACTURES): Vit D, 25-Hydroxy: 36.83 ng/mL (ref 30–100)

## 2023-12-10 LAB — PHOSPHORUS: Phosphorus: 2.4 mg/dL — ABNORMAL LOW (ref 2.5–4.6)

## 2023-12-10 MED ORDER — INSULIN GLARGINE-YFGN 100 UNIT/ML ~~LOC~~ SOLN: 14.0000 [IU] | SUBCUTANEOUS | Status: DC

## 2023-12-10 MED ORDER — INSULIN GLARGINE 100 UNIT/ML ~~LOC~~ SOLN
14.0000 [IU] | SUBCUTANEOUS | Status: DC
  Administered 2023-12-10 – 2023-12-12 (×3): 14 [IU] via SUBCUTANEOUS
  Filled 2023-12-10 (×3): qty 0.14

## 2023-12-10 MED ORDER — INSULIN ASPART 100 UNIT/ML IJ SOLN
3.0000 [IU] | Freq: Three times a day (TID) | INTRAMUSCULAR | Status: DC
  Administered 2023-12-10 – 2023-12-12 (×5): 3 [IU] via SUBCUTANEOUS
  Filled 2023-12-10 (×5): qty 1

## 2023-12-10 MED ORDER — INSULIN ASPART 100 UNIT/ML IJ SOLN
0.0000 [IU] | Freq: Three times a day (TID) | INTRAMUSCULAR | Status: DC
  Administered 2023-12-10 (×2): 2 [IU] via SUBCUTANEOUS
  Administered 2023-12-11: 3 [IU] via SUBCUTANEOUS
  Administered 2023-12-11: 2 [IU] via SUBCUTANEOUS
  Administered 2023-12-11: 5 [IU] via SUBCUTANEOUS
  Administered 2023-12-12: 1 [IU] via SUBCUTANEOUS
  Filled 2023-12-10 (×6): qty 1

## 2023-12-10 NOTE — Telephone Encounter (Signed)
 Pharmacy Patient Advocate Encounter  Insurance verification completed.    The patient is insured through [employer]/ [employer].     Ran test claim for Lantus  Solostar Pen and the current 30 day co-pay is $35.00.   This test claim was processed through [facility]- copay amounts may vary at other pharmacies due to pharmacy/plan contracts, or as the patient moves through the different stages of their insurance plan.

## 2023-12-10 NOTE — Evaluation (Addendum)
 Occupational Therapy Evaluation Patient Details Name: Alejandra Lowe MRN: [id number] DOB: [birth year] Today's Date: 12/10/2023   History of Present Illness   Pt is a 78 year old female presented to ED for feeling sick, nauseated, vomiting with high blood sugars; admitted with acute DKA     PMH significant for  diabetes, HLD, HTN, dementia     Clinical Impressions Chart reviewed to date, pt greeted in bed, oriented to self and place. She is pleasantly confused and presents with deficits in overall awareness of current level of functioning and safety. Pt is a poor historian, husband is not in the room during evaluation therefore will need to confirm home set up/PLOF. Pt reports she is MOD I and manages her own medicine. She reports her husband assists with IADLs. She cannot recall if she amb with a device, reports recently she has been using a metal thing. Pt presents with deficits in strength, endurance, activity tolerance, balance, cognition, affecting safe and optimal ADL completion. Pt requires MOD A for bed mobility, STS with CGA-MIN A, amb approx 4' in room with RW with MIN A. MAX A required for LB dressing. Frequent multi modal cues required throughout for sequencing/safety. Dizziness reported with position changes/amb. BP 115/60 (75) sitting on edge of bed after amb attempt. Pt will benefit from acute OT to address functional deficits and to facilitate optimal ADL/functional mobility performance.      If plan is discharge home, recommend the following:   A little help with walking and/or transfers;A little help with bathing/dressing/bathroom;Supervision due to cognitive status;Direct supervision/assist for medications management;Direct supervision/assist for financial management     Functional Status Assessment   Patient has had a recent decline in their functional status and demonstrates the ability to make significant improvements in function in a reasonable and predictable amount  of time.     Equipment Recommendations   BSC/3in1;Other (comment) (2WW)     Recommendations for Other Services         Precautions/Restrictions   Precautions Precautions: Fall Recall of Precautions/Restrictions: Impaired Restrictions Weight Bearing Restrictions Per Provider Order: No     Mobility Bed Mobility Overal bed mobility: Needs Assistance Bed Mobility: Supine to Sit, Sit to Supine     Supine to sit: Mod assist, HOB elevated, Used rails Sit to supine: Mod assist, Used rails, HOB elevated   General bed mobility comments: frequent multi modal cues for technique    Transfers Overall transfer level: Needs assistance Equipment used: Rolling walker (2 wheels) Transfers: Sit to/from Stand Sit to Stand: Contact guard assist, Min assist                  Balance Overall balance assessment: Needs assistance Sitting-balance support: Feet supported Sitting balance-Leahy Scale: Good     Standing balance support: Bilateral upper extremity supported, During functional activity, Reliant on assistive device for balance Standing balance-Leahy Scale: Fair                             ADL either performed or assessed with clinical judgement   ADL Overall ADL's : Needs assistance/impaired     Grooming: Minimal assistance;Bed level Grooming Details (indicate cue type and reason): anticipate             Lower Body Dressing: Maximal assistance Lower Body Dressing Details (indicate cue type and reason): socks Toilet Transfer: Minimal assistance;Ambulation;Rolling walker (2 wheels);Cueing for safety;Cueing for sequencing Toilet Transfer Details (indicate cue type and  reason): simulated         Functional mobility during ADLs: Minimal assistance;Rolling walker (2 wheels);Cueing for sequencing;Cueing for safety (approx 4')       Vision Patient Visual Report: No change from baseline       Perception         Praxis         Pertinent  Vitals/Pain Pain Assessment Pain Assessment: PAINAD Breathing: normal Negative Vocalization: none Facial Expression: smiling or inexpressive Body Language: relaxed Consolability: no need to console PAINAD Score: 0 Pain Intervention(s): Monitored during session (pt reports chronic back pain but reports she is ok during OTeval)     Extremity/Trunk Assessment Upper Extremity Assessment Upper Extremity Assessment: Overall WFL for tasks assessed   Lower Extremity Assessment Lower Extremity Assessment: Generalized weakness (able to perform SLR off bed BLE)       Communication Communication Communication: No apparent difficulties   Cognition Arousal: Alert Behavior During Therapy: WFL for tasks assessed/performed Cognition: No family/caregiver present to determine baseline, History of cognitive impairments, Cognition impaired   Orientation impairments: Time, Situation (my legs got weak) Awareness: Intellectual awareness impaired, Online awareness impaired Memory impairment (select all impairments): Declarative long-term memory, Short-term memory, Non-declarative long-term memory, Working memory Attention impairment (select first level of impairment): Sustained attention Executive functioning impairment (select all impairments): Organization, Sequencing, Reasoning, Problem solving OT - Cognition Comments: poor safety awareness and awareness of current level of deficits; When asked who she would call in a fire, she reports she would call her husband                 Following commands: Intact       Cueing  General Comments   Cueing Techniques: Verbal cues;Tactile cues  vss throughout, pt endorses dizziness with position changes sitting on edge of bed, BP 115/60 (MAP 75)   Exercises Other Exercises Other Exercises: edu re role of OT, role of rehab, discharge recommendations   Shoulder Instructions      Home Living Family/patient expects to be discharged to:: Private  residence Living Arrangements: Spouse/significant other Available Help at Discharge: Family Type of Home: House Home Access: Level entry     Home Layout: One level                   Additional Comments: will need to confirm home set up/PLOF; pt is a poor historian; She reports she amb with a metal thing, unable to verity cane/walker      Prior Functioning/Environment Prior Level of Function : Needs assist;Patient poor historian/Family not available             Mobility Comments: pt reports she amb with a device, ?historian ADLs Comments: pt reports husband assists with IADLs but she manages her medications, she reports she is MOD I with ADL. Will need to confirm    OT Problem List: Decreased strength;Decreased cognition;Impaired balance (sitting and/or standing);Decreased range of motion;Decreased safety awareness;Decreased activity tolerance;Decreased knowledge of use of DME or AE   OT Treatment/Interventions: Self-care/ADL training;DME and/or AE instruction;Therapeutic exercise;Therapeutic activities;Balance training;Energy conservation;Patient/family education      OT Goals(Current goals can be found in the care plan section)   Acute Rehab OT Goals Patient Stated Goal: feel better OT Goal Formulation: With patient Time For Goal Achievement: 12/24/23 Potential to Achieve Goals: Good ADL Goals Pt Will Perform Grooming: with supervision;sitting;standing Pt Will Perform Lower Body Dressing: with supervision;sitting/lateral leans;sit to/from stand Pt Will Transfer to Toilet: with supervision;ambulating Pt Will Perform Toileting -  Clothing Manipulation and hygiene: with set-up;sitting/lateral leans;sit to/from stand   OT Frequency:  Min 2X/week    Co-evaluation              AM-PAC OT 6 Clicks Daily Activity     Outcome Measure Help from another person eating meals?: None Help from another person taking care of personal grooming?: None Help from  another person toileting, which includes using toliet, bedpan, or urinal?: A Lot Help from another person bathing (including washing, rinsing, drying)?: A Lot Help from another person to put on and taking off regular upper body clothing?: A Little Help from another person to put on and taking off regular lower body clothing?: A Lot 6 Click Score: 17   End of Session Equipment Utilized During Treatment: Rolling walker (2 wheels) Nurse Communication: Mobility status  Activity Tolerance: Patient tolerated treatment well Patient left: in bed;with call bell/phone within reach;with bed alarm set (modified chair position)  OT Visit Diagnosis: Other abnormalities of gait and mobility (R26.89);Other symptoms and signs involving cognitive function                Time: 8888-8871 OT Time Calculation (min): 17 min Charges:  OT General Charges $OT Visit: 1 Visit OT Evaluation $OT Eval Moderate Complexity: 1 Mod  Therisa Sheffield, OTD OTR/L  12/10/23, 12:26 PM

## 2023-12-10 NOTE — Inpatient Diabetes Management (Signed)
 Inpatient Diabetes Program Recommendations  AACE/ADA: New Consensus Statement on Inpatient Glycemic Control (2015)  Target Ranges:  Prepandial:   less than 140 mg/dL      Peak postprandial:   less than 180 mg/dL (1-2 hours)      Critically ill patients:  140 - 180 mg/dL    Latest Reference Range & Units 12/09/23 23:00 12/09/23 23:30 12/10/23 00:32 12/10/23 01:31 12/10/23 02:32 12/10/23 03:47 12/10/23 04:33 12/10/23 05:44 12/10/23 06:31 12/10/23 07:30  Glucose-Capillary 70 - 99 mg/dL 554 (H) 607 (H) 642 (H) 283 (H) 231 (H) 218 (H) 180 (H) 142 (H) 132 (H) 152 (H)  IV Insulin  Drip Running  (H): Data is abnormally high   Admit with: DKA  History: Type 1 Diabetes  Home DM Meds: Medtronic 630 G insulin  pump  Basal 12A 0.5 units/hr 12P 0.7 units/hr Total daily basal 14.4 units I:CR 1:10 grams I:SF 1:30 mg/dl Target glucose 19-879 mg/dl   Current Orders: IV Insulin  Drip    MD- Note BMET and BHB level from 4am show acidosis much improved Do NOT recommend pt resume Home Insulin  Pump given her level of confusion (husband cannot help with insulin  pump)  For transition off the IV Insulin  Drip, recommend the following:  1. Start Lantus  14 units daily (Can stop IV Insulin  Drip 2 hours after Lantus  given)  2. Start Novolog  Sensitive Correction Scale/ SSI (0-9 units) TID AC + HS  3. If eating, Start Novolog  3 units TID with meals for meal coverage HOLD if pt NPO HOLD if pt eats <50% meals    ENDO: Dr. Cherilyn with [facility] Last Seen 08/25/2023  Per Diabetes Coordinator notes from yest 08/28:  Pt was confused--Not able to answer questions about insulin  pump Patient's husband reports that he does not know how to change her site or operate her insulin  pump.  Patient's husband reports that patient has been more confused lately and he is not sure that she is managing her insulin  pump or DM well.  Question if patient is appropriate to continue to use the insulin  pump since she is  having confusion (husband reports patient has been more confused over past several weeks).      --Will follow patient during hospitalization--  Adina Rudolpho Arrow RN, MSN, CDCES Diabetes Coordinator Inpatient Glycemic Control Team Team Pager: [phone] (8a-5p)

## 2023-12-10 NOTE — Plan of Care (Signed)
   Problem: Coping: Goal: Level of anxiety will decrease Outcome: Progressing   Problem: Elimination: Goal: Will not experience complications related to urinary retention Outcome: Progressing   Problem: Pain Managment: Goal: General experience of comfort will improve and/or be controlled Outcome: Progressing

## 2023-12-10 NOTE — Inpatient Diabetes Management (Signed)
 Do NOT recommend pt resume Home Insulin  Pump given her level of confusion (husband cannot help with insulin  pump)   Please d/c home on Lantus  + Novolog :  Lantus  14 units Daily  Novolog  Sensitive Correction Scale/ SSI (0-9 units) TID AC + HS  Novolog  3 units TID with meals  Insulin  Pen Needles   Recommend we discharge pt home with Rxs for Lantus  and Novolog  Insulin  Pens until pt can follow up with Dr. Cherilyn (ENDO).  Have pt's husband assist pt with all injections until f/u with ENDO.  Spoke w/ pt and husband at bedside.  Detailed info given to husband as pt does not seem to be comprehending everything we talked about and husband concerned about pt's memory and her ability to safely use her insulin  pump.  Discussed w/ pt and husband that I am concerned about pt resuming her insulin  pump when she goes home and that I will be recommending to the discharging physician that pt be given Rxs for basal and bolus insulin  (insulin  pens) and that pt's husband assist pt with insulin  until pt can follow up with her ENDO.  Husband told me that the ENDO office told husband to call them as soon as pt released from the hospital so they can get pt in to see Dr. Cherilyn asap.  Per husband, if he can learn how to use pt's insulin  pump at home, pt may be able to resume insulin  pump after seeing ENDO next week.  Reviewed with pt's husband Lantus  and Novolog  insulins.  Explained that Lantus  needs to be given daily at the same time every day and that the Novolog  will be given TID with meals.  Educated patient on insulin  pen use at home.  Reviewed all steps of insulin  pen including attachment of needle, 2-unit air shot, dialing up dose, giving injection, rotation of injection sites, removing needle, disposal of sharps, storage of unused insulin , disposal of insulin  etc.  Patient's husband able to provide successful return demonstration.  Reviewed troubleshooting with insulin  pen.  Also reviewed Signs/Symptoms of  Hypoglycemia with patient and how to treat Hypoglycemia at home.      --Will follow patient during hospitalization--  Adina Rudolpho Arrow RN, MSN, CDCES Diabetes Coordinator Inpatient Glycemic Control Team Team Pager: [phone] (8a-5p)

## 2023-12-10 NOTE — Progress Notes (Addendum)
 [facility] Progress Note  Patient: Alejandra Lowe    FMW:[id number]  DOA: 12/09/2023     Date of Service: the patient was seen and examined on 12/10/2023  Chief Complaint  Patient presents with   Hyperglycemia   Brief hospital course: Alejandra Lowe is a pleasant 78 y.o. female with medical history significant for Type 1 Diabetes, HLD, HTN, dementia who presented to ED for feeling sick, nauseated, vomiting with high blood sugars.  Patient has insulin  pump which has not been attached, it was unclear whether she was using insulin  pump at home or not.  It appears that patient has dementia and she does not remember when I asked her questions.  Patient's husband was at bedside and when I asked he said he has not helped her with the insulin  she does herself.  He said that the blood sugar indicated high and high and she was confused and she was weak and she vomited once and he called EMS.  Patient denies any abdominal pain, fever, chest pain or shortness of breath.   ED Course: Upon arrival to the ED, patient is found to be hyperglycemic blood sugar around 500 by EMS.  Blood sugars were 1045, potassium 6.7, sodium 122, creatinine 2.84 BUN 61 white count 12.7 beta hydroxybutyrate more than 8.  Patient was started on IV fluid and insulin  drip.  Hospitalist service was consulted for evaluation for admission for DKA.   Assessment and Plan:  # Acute DKA (Diabetic ketoacidosis) - It appears that her insulin  pump was not working. - Patient appears to be demented and husband does not have a clue whether she was taking insulin  or not. - It appears that there was some discrepancy between using insulin  or forgetting to use insulin . S/p insulin  IV infusion and bolus IVF given DKA resolved, anion gap closed. Transition to subcu insulin  Started Lantus  14 units, NovoLog  3 units 3 times daily and NovoLog  sliding scale Monitor CBG Continue medium carb modified diet Continue IV fluid for hydration Continue  to monitor on telemetry for now    # Hyperkalemia due to DKA: Resolved  - There is no tented T waves in EKG K 4.5 wnl - continue to monitor potassium   # Pseudohyponatremia due to hyperglycemia. Na 137, Resolved  - Continue to monitor   # HTN - Blood pressure fairly controlled - Continue to monitor blood pressure - Continue Cozaar    # Hyperlipidemia - Continue statin   # Dementia - She takes donepezil at home - Will hold off that medication while she is in the hospital - Continue supportive care   # AKI: Due to DKA likely prerenal sCr 2.6 >> 1.37 - Continue IV fluid - Continue to monitor kidney function     There is no height or weight on file to calculate BMI.  Interventions:  Diet: Medium carb modified diet DVT Prophylaxis: Subcutaneous Heparin     Advance goals of care discussion: DNR-limited  Family Communication: family was not present at bedside, at the time of interview.  The pt provided permission to discuss medical plan with the family. Opportunity was given to ask question and all questions were answered satisfactorily.   Disposition:  Pt is from home, admitted with DKA, still has high BG, which precludes a safe discharge. Discharge to home, when stable, most likely in 1 to 2 days.  Subjective: No significant events overnight, patient has dementia AO x 2, denies any nausea or vomiting but has decreased appetite, she does not feel  like eating. Patient still feels generalized weakness, tired and has a backache. Denied any specific complaints, no chest pain or palpitation, no shortness of breath.  Physical Exam: General: NAD, lying comfortably Appear in no distress, affect appropriate Eyes: PERRLA ENT: Oral Mucosa Clear, moist  Neck: no JVD,  Cardiovascular: S1 and S2 Present, no Murmur,  Respiratory: good respiratory effort, Bilateral Air entry equal and Decreased, no Crackles, no wheezes Abdomen: Bowel Sound present, Soft and no tenderness,  Skin:  no rashes Extremities: no Pedal edema, no calf tenderness Neurologic: without any new focal findings Gait not checked due to patient safety concerns  Vitals:   12/10/23 1130 12/10/23 1152 12/10/23 1200 12/10/23 1230  BP: (!) 120/57  128/64 (!) 127/52  Pulse: 73  70 73  Resp: 11  14 15   Temp:  99.1 F (37.3 C)    TempSrc:  Oral    SpO2:        Intake/Output Summary (Last 24 hours) at 12/10/2023 1405 Last data filed at 12/10/2023 1246 Gross per 24 hour  Intake 7190.05 ml  Output 1220 ml  Net 5970.05 ml   There were no vitals filed for this visit.  Data Reviewed: I have personally reviewed and interpreted daily labs, tele strips, imagings as discussed above. I reviewed all nursing notes, pharmacy notes, vitals, pertinent old records I have discussed plan of care as described above with RN and patient/family.  CBC: Recent Labs  Lab 12/09/23 0945 12/10/23 0411  WBC 12.7* 12.2*  HGB 11.0* 9.5*  HCT 36.3 27.9*  MCV 107.7* 94.3  PLT 267 196   Basic Metabolic Panel: Recent Labs  Lab 12/09/23 1521 12/09/23 2141 12/10/23 0157 12/10/23 0411 12/10/23 1010  NA 129* 131* 133* 135 137  K 5.5* 4.5 3.8 3.9 4.5  CL 95* 101 104 107 107  CO2 8* 19* 21* 22 24  GLUCOSE 836* 528* 280* 196* 198*  BUN 56* 53* 49* 48* 42*  CREATININE 2.67* 2.15* 1.78* 1.63* 1.29*  CALCIUM  8.6* 8.6* 8.6* 8.6* 8.6*  MG  --  1.7  --   --  2.2  PHOS  --  2.6  --   --  2.4*    Studies: No results found.  Scheduled Meds:  atorvastatin   10 mg Oral Daily   calcium  carbonate  500 mg of elemental calcium  Oral Q breakfast   Chlorhexidine  Gluconate Cloth  6 each Topical Daily   cholecalciferol   2,000 Units Oral Daily   heparin   5,000 Units Subcutaneous Q8H   insulin  aspart  0-9 Units Subcutaneous TID WC   insulin  aspart  3 Units Subcutaneous TID WC   insulin  glargine  14 Units Subcutaneous Q24H   losartan   25 mg Oral Daily   Continuous Infusions: PRN Meds: acetaminophen  **OR** acetaminophen ,  dextrose , ondansetron  **OR** ondansetron  (ZOFRAN ) IV, polyethylene glycol  Time spent: 55 minutes  Author: ELVAN SOR. MD [facility] 12/10/2023 2:05 PM  To reach On-call, see care teams to locate the attending and reach out to them via [email or web address]. If 7PM-7AM, please contact night-coverage If you still have difficulty reaching the attending provider, please page the [facility] (Director on Call) for [facility] on amion for assistance.

## 2023-12-10 NOTE — Progress Notes (Signed)
 Report called given to Andrill.

## 2023-12-10 NOTE — Evaluation (Signed)
 Physical Therapy Evaluation Patient Details Name: Alejandra Lowe MRN: [id number] DOB: 12-01-1945 Today's Date: 12/10/2023  History of Present Illness  Pt is a 78 year old female presented to ED for feeling sick, nauseated, vomiting with high blood sugars; admitted with acute DKA     PMH significant for  diabetes, HLD, HTN, dementia  Clinical Impression  Pt alert, oriented to self and place, pleasantly confused throughout session but agreeable to PT evaluation. Pt denied pain throughout session. Per pt and pt's spouse, at baseline pt is modI with ambulation with a quad cane and is able to perform basic ADLs independently. Pt was able to initiate supine > sit transfer, but ultimately required modA to achieve sitting at EOB. STS from EOB with minA, increased time and effort. Pt amb ~5 ft in room with RW and minA to initiate forward movement. Pt required max multimodal cues and step-by-step instructions to complete ambulation and sit into recliner. Pt's spouse endorses that pt is not at her baseline cognitively or physically at time of PT evaluation. Pt is currently displaying deficits in functional mobility, balance, cognition, and strength and would benefit from skilled PT intervention to address listed deficits and allow for safe return to PLOF.         If plan is discharge home, recommend the following: A lot of help with walking and/or transfers;A lot of help with bathing/dressing/bathroom;Assistance with cooking/housework;Direct supervision/assist for medications management;Direct supervision/assist for financial management;Assist for transportation;Help with stairs or ramp for entrance;Supervision due to cognitive status   Can travel by private vehicle   Yes    Equipment Recommendations Rolling walker (2 wheels)  Recommendations for Other Services       Functional Status Assessment Patient has had a recent decline in their functional status and demonstrates the ability to make significant  improvements in function in a reasonable and predictable amount of time.     Precautions / Restrictions Precautions Precautions: Fall Recall of Precautions/Restrictions: Impaired Restrictions Weight Bearing Restrictions Per Provider Order: No      Mobility  Bed Mobility Overal bed mobility: Needs Assistance Bed Mobility: Supine to Sit     Supine to sit: Mod assist, HOB elevated     General bed mobility comments: pt able to initiate supine > sit, ultimately required modA to achieve sitting at EOB    Transfers Overall transfer level: Needs assistance Equipment used: Rolling walker (2 wheels) Transfers: Sit to/from Stand Sit to Stand: Mod assist           General transfer comment: STS from EOB with modA, increased time and effort. Pt required cues to stand on flat feet 2/2 standing on tiptoes.    Ambulation/Gait Ambulation/Gait assistance: Min assist Gait Distance (Feet): 5 Feet Assistive device: Rolling walker (2 wheels) Gait Pattern/deviations: Step-to pattern, Shuffle, Narrow base of support       General Gait Details: pt provided multimodal step-by-step cues to amb forward and turn to sit into recliner. Very slowed cadence, minA provided to assist with initiation of movement.  Stairs            Wheelchair Mobility     Tilt Bed    Modified Rankin (Stroke Patients Only)       Balance Overall balance assessment: Needs assistance Sitting-balance support: Feet supported Sitting balance-Leahy Scale: Good     Standing balance support: Bilateral upper extremity supported, Reliant on assistive device for balance Standing balance-Leahy Scale: Fair Standing balance comment: no LOB, heavy UE use  Pertinent Vitals/Pain Pain Assessment Pain Assessment: PAINAD Breathing: normal Negative Vocalization: none Facial Expression: smiling or inexpressive Body Language: relaxed Consolability: no need to console PAINAD  Score: 0 Pain Intervention(s): Monitored during session, Repositioned    Home Living Family/patient expects to be discharged to:: Private residence Living Arrangements: Spouse/significant other Available Help at Discharge: Family;Available 24 hours/day Type of Home: House Home Access: Stairs to enter Entrance Stairs-Rails: Doctor, general practice of Steps: 2   Home Layout: One level Home Equipment: Cane - quad Additional Comments: home layout confirmed with pt's spouse    Prior Function Prior Level of Function : Independent/Modified Independent;Patient poor historian/Family not available             Mobility Comments: Pt's spouse reports that she uses a quad cane for amb at baseline. Pt denied any falls recently ADLs Comments: Pt states that she was able to perform BADLs IND, but her husband cooks     Extremity/Trunk Assessment   Upper Extremity Assessment Upper Extremity Assessment: Defer to OT evaluation    Lower Extremity Assessment Lower Extremity Assessment: Generalized weakness       Communication   Communication Communication: No apparent difficulties    Cognition Arousal: Alert Behavior During Therapy: WFL for tasks assessed/performed   PT - Cognitive impairments: History of cognitive impairments                       PT - Cognition Comments: pleasantly confused, oriented to self and place Following commands: Intact       Cueing Cueing Techniques: Verbal cues, Tactile cues, Visual cues     General Comments      Exercises Other Exercises Other Exercises: BP 121/65 (71) with pt sitting at EOB.   Assessment/Plan    PT Assessment Patient needs continued PT services  PT Problem List Decreased strength;Decreased activity tolerance;Decreased balance;Decreased mobility;Decreased coordination;Decreased cognition;Decreased knowledge of use of DME;Decreased safety awareness       PT Treatment Interventions DME instruction;Gait  training;Stair training;Functional mobility training;Therapeutic activities;Therapeutic exercise;Balance training;Neuromuscular re-education;Cognitive remediation;Patient/family education    PT Goals (Current goals can be found in the Care Plan section)  Acute Rehab PT Goals Patient Stated Goal: to get better PT Goal Formulation: With patient Time For Goal Achievement: 12/24/23 Potential to Achieve Goals: Fair    Frequency Min 2X/week     Co-evaluation               AM-PAC PT 6 Clicks Mobility  Outcome Measure Help needed turning from your back to your side while in a flat bed without using bedrails?: A Little Help needed moving from lying on your back to sitting on the side of a flat bed without using bedrails?: A Lot Help needed moving to and from a bed to a chair (including a wheelchair)?: A Little Help needed standing up from a chair using your arms (e.g., wheelchair or bedside chair)?: A Lot Help needed to walk in hospital room?: A Lot Help needed climbing 3-5 steps with a railing? : A Lot 6 Click Score: 14    End of Session   Activity Tolerance: Patient tolerated treatment well Patient left: in chair;with call bell/phone within reach;with nursing/sitter in room Nurse Communication: Mobility status PT Visit Diagnosis: Unsteadiness on feet (R26.81);Other abnormalities of gait and mobility (R26.89);Muscle weakness (generalized) (M62.81);Difficulty in walking, not elsewhere classified (R26.2)    Time: 8673-8655 PT Time Calculation (min) (ACUTE ONLY): 18 min   Charges:   PT Evaluation $PT Eval Low  Complexity: 1 Low PT Treatments $Therapeutic Activity: 8-22 mins PT General Charges $$ ACUTE PT VISIT: 1 Visit         Kathleena Freeman, SPT

## 2023-12-11 DIAGNOSIS — E1010 Type 1 diabetes mellitus with ketoacidosis without coma: Secondary | ICD-10-CM | POA: Diagnosis not present

## 2023-12-11 LAB — CBC
HCT: 30.5 % — ABNORMAL LOW (ref 36.0–46.0)
Hemoglobin: 10.4 g/dL — ABNORMAL LOW (ref 12.0–15.0)
MCH: 32.4 pg (ref 26.0–34.0)
MCHC: 34.1 g/dL (ref 30.0–36.0)
MCV: 95 fL (ref 80.0–100.0)
Platelets: 199 K/uL (ref 150–400)
RBC: 3.21 MIL/uL — ABNORMAL LOW (ref 3.87–5.11)
RDW: 13.4 % (ref 11.5–15.5)
WBC: 9.6 K/uL (ref 4.0–10.5)
nRBC: 0 % (ref 0.0–0.2)

## 2023-12-11 LAB — BASIC METABOLIC PANEL WITH GFR
Anion gap: 13 (ref 5–15)
BUN: 28 mg/dL — ABNORMAL HIGH (ref 8–23)
CO2: 21 mmol/L — ABNORMAL LOW (ref 22–32)
Calcium: 8.7 mg/dL — ABNORMAL LOW (ref 8.9–10.3)
Chloride: 104 mmol/L (ref 98–111)
Creatinine, Ser: 1.06 mg/dL — ABNORMAL HIGH (ref 0.44–1.00)
GFR, Estimated: 54 mL/min — ABNORMAL LOW (ref >60)
Glucose, Bld: 264 mg/dL — ABNORMAL HIGH (ref 70–99)
Potassium: 3.8 mmol/L (ref 3.5–5.1)
Sodium: 138 mmol/L (ref 135–145)

## 2023-12-11 LAB — HEPATIC FUNCTION PANEL
ALT: 95 U/L — ABNORMAL HIGH (ref 0–44)
AST: 144 U/L — ABNORMAL HIGH (ref 15–41)
Albumin: 3.5 g/dL (ref 3.5–5.0)
Alkaline Phosphatase: 60 U/L (ref 38–126)
Bilirubin, Direct: 0.2 mg/dL (ref 0.0–0.2)
Indirect Bilirubin: 0.6 mg/dL (ref 0.3–0.9)
Total Bilirubin: 0.8 mg/dL (ref 0.0–1.2)
Total Protein: 5.8 g/dL — ABNORMAL LOW (ref 6.5–8.1)

## 2023-12-11 LAB — GLUCOSE, CAPILLARY
Glucose-Capillary: 264 mg/dL — ABNORMAL HIGH (ref 70–99)
Glucose-Capillary: 170 mg/dL — ABNORMAL HIGH (ref 70–99)
Glucose-Capillary: 212 mg/dL — ABNORMAL HIGH (ref 70–99)
Glucose-Capillary: 127 mg/dL — ABNORMAL HIGH (ref 70–99)

## 2023-12-11 LAB — MAGNESIUM: Magnesium: 2 mg/dL (ref 1.7–2.4)

## 2023-12-11 LAB — PHOSPHORUS: Phosphorus: 1.8 mg/dL — ABNORMAL LOW (ref 2.5–4.6)

## 2023-12-11 MED ORDER — POTASSIUM PHOSPHATES 15 MMOLE/5ML IV SOLN
30.0000 mmol | Freq: Once | INTRAVENOUS | Status: DC
  Filled 2023-12-11: qty 10

## 2023-12-11 MED ORDER — K PHOS MONO-SOD PHOS DI & MONO 155-852-130 MG PO TABS
500.0000 mg | ORAL_TABLET | Freq: Four times a day (QID) | ORAL | Status: AC
  Administered 2023-12-11 (×4): 500 mg via ORAL
  Filled 2023-12-11 (×4): qty 2

## 2023-12-11 NOTE — Plan of Care (Signed)
   Problem: Health Behavior/Discharge Planning: Goal: Ability to manage health-related needs will improve Outcome: Progressing

## 2023-12-11 NOTE — Plan of Care (Signed)
   Problem: Activity: Goal: Risk for activity intolerance will decrease Outcome: Progressing   Problem: Nutrition: Goal: Adequate nutrition will be maintained Outcome: Progressing   Problem: Safety: Goal: Ability to remain free from injury will improve Outcome: Progressing

## 2023-12-11 NOTE — Progress Notes (Signed)
 Physical Therapy Treatment Patient Details Name: Alejandra Lowe MRN: [id number] DOB: Oct 28, 1945 Today's Date: 12/11/2023   History of Present Illness Pt is a 78 year old female presented to ED for feeling sick, nauseated, vomiting with high blood sugars; admitted with acute DKA     PMH significant for  diabetes, HLD, HTN, dementia    PT Comments  Author + unassigned RN responded to pt's bed alarm going off. Pt was standing between bed and window with foley cath stretch to max holding her tele monitor. Pt is pleasantly confused. Extremely poor balance observed without pt having UE support. Has LOB several times until given a RW for UE support. I need to move, I've been laying around too long. Pt was aware of day and city but unaware of situation. Poor awareness of her deficits. Pt was pleasantly confused. She tolerated gait well with RW but remains at high risk of falls. Assigned RN in room after pt ambulated with Chartered loss adjuster. DC recs remain appropriate to improve pt's safety with all ADLs.     If plan is discharge home, recommend the following: A little help with walking and/or transfers;A lot of help with bathing/dressing/bathroom;Assistance with cooking/housework;Direct supervision/assist for medications management;Direct supervision/assist for financial management;Assist for transportation;Help with stairs or ramp for entrance;Supervision due to cognitive status     Equipment Recommendations  Rolling walker (2 wheels)       Precautions / Restrictions Precautions Precautions: Fall Recall of Precautions/Restrictions: Impaired Restrictions Weight Bearing Restrictions Per Provider Order: No     Mobility  Bed Mobility  General bed mobility comments: pt was standing between bed and widow with fall prevention alarm going off. pt very unsteady without UE support. unaware she was about to pull foley cath out.    Transfers Overall transfer level: Needs assistance Equipment used: Rolling walker  (2 wheels) Transfers: Sit to/from Stand Sit to Stand: Contact guard assist (for safety. pt is high fall risk)   Ambulation/Gait Ambulation/Gait assistance: Contact guard assist, Min assist Gait Distance (Feet): 75 Feet Assistive device: Rolling walker (2 wheels) Gait Pattern/deviations: Step-through pattern, Trunk flexed, Shuffle Gait velocity: decreased  General Gait Details: Pt endorses. I need to move. author elected to take pt for walk. Much improved balance with DME/RW use. Pt unaware of her balance deficits and remains at high risk of falls due to cognition/awareness.    Balance Overall balance assessment: Needs assistance Sitting-balance support: Feet supported Sitting balance-Leahy Scale: Good     Standing balance support: No upper extremity supported, During functional activity Standing balance-Leahy Scale: Poor Standing balance comment: pt is at high risk of falls due to poor awareness in addition to poor standing balance without BUE support. balance improved with use of RW       Communication Communication Communication: No apparent difficulties  Cognition Arousal: Alert Behavior During Therapy: Restless   PT - Cognitive impairments: History of cognitive impairments    PT - Cognition Comments: Pt is alert but pleasantly confused. did know she was [place] and correct day of week however poor insight of situation. Author responded to pt getting OOB with alarm going off. Pt was losing her balance with foley cath outstretched to max that tubing allow. Author issued pt RW to prevent pt fall. Following commands: Intact      Cueing Cueing Techniques: Verbal cues, Tactile cues     General Comments General comments (skin integrity, edema, etc.): pt educated on importance of not getting up on her own and use of call bell.  Highly encouraged pt to use RW at all times.      Pertinent Vitals/Pain Pain Assessment Pain Assessment: No/denies pain Breathing: normal      PT Goals (current goals can now be found in the care plan section) Acute Rehab PT Goals Patient Stated Goal:  get this stuff off me. (referring to cardiac monitor/ foley) Progress towards PT goals: Progressing toward goals    Frequency    Min 2X/week       AM-PAC PT 6 Clicks Mobility   Outcome Measure  Help needed turning from your back to your side while in a flat bed without using bedrails?: A Little Help needed moving from lying on your back to sitting on the side of a flat bed without using bedrails?: A Little Help needed moving to and from a bed to a chair (including a wheelchair)?: A Little Help needed standing up from a chair using your arms (e.g., wheelchair or bedside chair)?: A Little Help needed to walk in hospital room?: A Little Help needed climbing 3-5 steps with a railing? : A Lot 6 Click Score: 17    End of Session   Activity Tolerance: Patient tolerated treatment well Patient left: in chair;with call bell/phone within reach;with nursing/sitter in room Nurse Communication: Mobility status PT Visit Diagnosis: Unsteadiness on feet (R26.81);Other abnormalities of gait and mobility (R26.89);Muscle weakness (generalized) (M62.81);Difficulty in walking, not elsewhere classified (R26.2)     Time: 0720-0738 PT Time Calculation (min) (ACUTE ONLY): 18 min  Charges:    $Gait Training: 8-22 mins PT General Charges $$ ACUTE PT VISIT: 1 Visit                    Rankin Essex PTA 12/11/23, 7:57 AM

## 2023-12-11 NOTE — Progress Notes (Signed)
 [facility] Progress Note  Patient: Alejandra Lowe    FMW:[id number]  DOA: 12/09/2023     Date of Service: the patient was seen and examined on 12/11/2023  Chief Complaint  Patient presents with   Hyperglycemia   Brief hospital course: Alejandra Lowe is a pleasant 78 y.o. female with medical history significant for Type 1 Diabetes, HLD, HTN, dementia who presented to ED for feeling sick, nauseated, vomiting with high blood sugars.  Patient has insulin  pump which has not been attached, it was unclear whether she was using insulin  pump at home or not.  It appears that patient has dementia and she does not remember when I asked her questions.  Patient's husband was at bedside and when I asked he said he has not helped her with the insulin  she does herself.  He said that the blood sugar indicated high and high and she was confused and she was weak and she vomited once and he called EMS.  Patient denies any abdominal pain, fever, chest pain or shortness of breath.   ED Course: Upon arrival to the ED, patient is found to be hyperglycemic blood sugar around 500 by EMS.  Blood sugars were 1045, potassium 6.7, sodium 122, creatinine 2.84 BUN 61 white count 12.7 beta hydroxybutyrate more than 8.  Patient was started on IV fluid and insulin  drip.  Hospitalist service was consulted for evaluation for admission for DKA.   Assessment and Plan:  # Acute DKA (Diabetic ketoacidosis): Resolved  - It appears that her insulin  pump was not working. - Patient appears to be demented and husband does not have a clue whether she was taking insulin  or not. - It appears that there was some discrepancy between using insulin  or forgetting to use insulin . S/p insulin  IV infusion and bolus IVF given DKA resolved, anion gap closed. Transition to subcu insulin  Started Lantus  14 units, NovoLog  3 units 3 times daily and NovoLog  sliding scale Monitor CBG Continue medium carb modified diet S/p IV fluid for  hydration Continue to monitor on telemetry for now    # Elevated LFTs, mildly unknown cause could be due to hyperglycemia Trend LFTs daily  # Hypophosphatemia, Phos repleted  # Hyperkalemia due to DKA: Resolved  - There is no tented T waves in EKG K 4.5 wnl - continue to monitor potassium   # Pseudohyponatremia due to hyperglycemia. Na 137, Resolved  - Continue to monitor   # HTN - Blood pressure fairly controlled - Continue to monitor blood pressure - Continue Cozaar    # Hyperlipidemia - Continue statin   # Dementia - She takes donepezil at home - Will hold off that medication while she is in the hospital - Continue supportive care   # AKI: Due to DKA likely prerenal sCr 2.6 >> 1.37 - Continue IV fluid - Continue to monitor kidney function   Anemia of chronic disease, iron profile, folate and B12 WNL H&H stable  Hypocalcemia due to hyperlipidemia, vitamin D  within normal range   There is no height or weight on file to calculate BMI.  Interventions:  Diet: Medium carb modified diet DVT Prophylaxis: Subcutaneous Heparin     Advance goals of care discussion: DNR-limited  Family Communication: family was not present at bedside, at the time of interview.  The pt provided permission to discuss medical plan with the family. Opportunity was given to ask question and all questions were answered satisfactorily.   Disposition:  Pt is from home, admitted with DKA, still has  electrolyte imbalance, which precludes a safe discharge. Discharge to home, when stable, most likely DC tomorrow a.m.  Subjective: No significant events overnight, patient was sitting comfortably on the recliner.  Patient's husband was at bedside.  Patient is back to her baseline, denies any complaints.  Patient wanted to go home but due to electrolyte imbalance we will keep her 1 more day and plan is for discharge tomorrow a.m.   Physical Exam: General: NAD, lying comfortably Appear in no  distress, affect appropriate Eyes: PERRLA ENT: Oral Mucosa Clear, moist  Neck: no JVD,  Cardiovascular: S1 and S2 Present, no Murmur,  Respiratory: good respiratory effort, Bilateral Air entry equal and Decreased, no Crackles, no wheezes Abdomen: Bowel Sound present, Soft and no tenderness,  Skin: no rashes Extremities: no Pedal edema, no calf tenderness Neurologic: without any new focal findings Gait not checked due to patient safety concerns  Vitals:   12/10/23 1459 12/10/23 1935 12/11/23 0509 12/11/23 0825  BP: (!) 136/58 131/62 (!) 150/77 (!) 158/74  Pulse: 73 78 75 77  Resp: 15 18 20 16   Temp: 98.6 F (37 C) 98.6 F (37 C) 98.4 F (36.9 C) 98.7 F (37.1 C)  TempSrc: Oral Oral Oral Oral  SpO2: 100% 95% 91% 93%    Intake/Output Summary (Last 24 hours) at 12/11/2023 1428 Last data filed at 12/11/2023 1419 Gross per 24 hour  Intake 600 ml  Output 2300 ml  Net -1700 ml   There were no vitals filed for this visit.  Data Reviewed: I have personally reviewed and interpreted daily labs, tele strips, imagings as discussed above. I reviewed all nursing notes, pharmacy notes, vitals, pertinent old records I have discussed plan of care as described above with RN and patient/family.  CBC: Recent Labs  Lab 12/09/23 0945 12/10/23 0411 12/11/23 0544  WBC 12.7* 12.2* 9.6  HGB 11.0* 9.5* 10.4*  HCT 36.3 27.9* 30.5*  MCV 107.7* 94.3 95.0  PLT 267 196 199   Basic Metabolic Panel: Recent Labs  Lab 12/09/23 2141 12/10/23 0157 12/10/23 0411 12/10/23 1010 12/10/23 1345 12/11/23 0544  NA 131* 133* 135 137 137 138  K 4.5 3.8 3.9 4.5 4.3 3.8  CL 101 104 107 107 106 104  CO2 19* 21* 22 24 22  21*  GLUCOSE 528* 280* 196* 198* 215* 264*  BUN 53* 49* 48* 42* 41* 28*  CREATININE 2.15* 1.78* 1.63* 1.29* 1.37* 1.06*  CALCIUM  8.6* 8.6* 8.6* 8.6* 8.8* 8.7*  MG 1.7  --   --  2.2  --  2.0  PHOS 2.6  --   --  2.4*  --  1.8*    Studies: No results found.  Scheduled Meds:   atorvastatin   10 mg Oral Daily   calcium  carbonate  500 mg of elemental calcium  Oral Q breakfast   Chlorhexidine  Gluconate Cloth  6 each Topical Daily   cholecalciferol   2,000 Units Oral Daily   heparin   5,000 Units Subcutaneous Q8H   insulin  aspart  0-9 Units Subcutaneous TID WC   insulin  aspart  3 Units Subcutaneous TID WC   insulin  glargine  14 Units Subcutaneous Q24H   losartan   25 mg Oral Daily   phosphorus  500 mg Oral QID   Continuous Infusions: PRN Meds: acetaminophen  **OR** acetaminophen , dextrose , ondansetron  **OR** ondansetron  (ZOFRAN ) IV, polyethylene glycol  Time spent: 40 minutes  Author: ELVAN SOR. MD [facility] 12/11/2023 2:28 PM  To reach On-call, see care teams to locate the attending and reach out to them  via [email or web address]. If 7PM-7AM, please contact night-coverage If you still have difficulty reaching the attending provider, please page the [facility] (Director on Call) for [facility] on amion for assistance.

## 2023-12-11 NOTE — Care Management Important Message (Signed)
 Important Message  Patient Details  Name: Alejandra Lowe MRN: [id number] Date of Birth: 1945-07-09   Important Message Given:  Yes - Medicare IM     [name] 12/11/2023, 2:27 PM

## 2023-12-12 DIAGNOSIS — E1010 Type 1 diabetes mellitus with ketoacidosis without coma: Secondary | ICD-10-CM | POA: Diagnosis not present

## 2023-12-12 LAB — BASIC METABOLIC PANEL WITH GFR
Anion gap: 11 (ref 5–15)
BUN: 14 mg/dL (ref 8–23)
CO2: 25 mmol/L (ref 22–32)
Calcium: 8.2 mg/dL — ABNORMAL LOW (ref 8.9–10.3)
Chloride: 105 mmol/L (ref 98–111)
Creatinine, Ser: 0.6 mg/dL (ref 0.44–1.00)
GFR, Estimated: >60 mL/min (ref >60)
Glucose, Bld: 136 mg/dL — ABNORMAL HIGH (ref 70–99)
Potassium: 3.4 mmol/L — ABNORMAL LOW (ref 3.5–5.1)
Sodium: 141 mmol/L (ref 135–145)

## 2023-12-12 LAB — MAGNESIUM: Magnesium: 1.7 mg/dL (ref 1.7–2.4)

## 2023-12-12 LAB — GLUCOSE, CAPILLARY: Glucose-Capillary: 144 mg/dL — ABNORMAL HIGH (ref 70–99)

## 2023-12-12 LAB — CBC
HCT: 28.3 % — ABNORMAL LOW (ref 36.0–46.0)
Hemoglobin: 9.8 g/dL — ABNORMAL LOW (ref 12.0–15.0)
MCH: 32.6 pg (ref 26.0–34.0)
MCHC: 34.6 g/dL (ref 30.0–36.0)
MCV: 94 fL (ref 80.0–100.0)
Platelets: 150 K/uL (ref 150–400)
RBC: 3.01 MIL/uL — ABNORMAL LOW (ref 3.87–5.11)
RDW: 13.2 % (ref 11.5–15.5)
WBC: 5.8 K/uL (ref 4.0–10.5)
nRBC: 0 % (ref 0.0–0.2)

## 2023-12-12 LAB — HEPATIC FUNCTION PANEL
ALT: 109 U/L — ABNORMAL HIGH (ref 0–44)
AST: 133 U/L — ABNORMAL HIGH (ref 15–41)
Albumin: 2.8 g/dL — ABNORMAL LOW (ref 3.5–5.0)
Alkaline Phosphatase: 70 U/L (ref 38–126)
Bilirubin, Direct: 0.2 mg/dL (ref 0.0–0.2)
Indirect Bilirubin: 0.9 mg/dL (ref 0.3–0.9)
Total Bilirubin: 1.1 mg/dL (ref 0.0–1.2)
Total Protein: 5.2 g/dL — ABNORMAL LOW (ref 6.5–8.1)

## 2023-12-12 LAB — PHOSPHORUS: Phosphorus: 3.5 mg/dL (ref 2.5–4.6)

## 2023-12-12 MED ORDER — LANCETS MISC: 1.0000 | Freq: Three times a day (TID) | 0 refills | Status: AC

## 2023-12-12 MED ORDER — POTASSIUM CHLORIDE CRYS ER 20 MEQ PO TBCR
40.0000 meq | EXTENDED_RELEASE_TABLET | Freq: Once | ORAL | Status: AC
  Administered 2023-12-12: 40 meq via ORAL
  Filled 2023-12-12: qty 2

## 2023-12-12 MED ORDER — INSULIN ASPART 100 UNIT/ML IJ SOLN: 3.0000 [IU] | Freq: Three times a day (TID) | INTRAMUSCULAR | 11 refills | Status: AC

## 2023-12-12 MED ORDER — PEN NEEDLES 31G X 5 MM MISC: 1.0000 | Freq: Three times a day (TID) | 0 refills | Status: AC

## 2023-12-12 MED ORDER — INSULIN ASPART 100 UNIT/ML FLEXPEN: 3.0000 [IU] | PEN_INJECTOR | Freq: Three times a day (TID) | SUBCUTANEOUS | 0 refills | Status: AC

## 2023-12-12 MED ORDER — BLOOD GLUCOSE TEST VI STRP: 1.0000 | ORAL_STRIP | Freq: Three times a day (TID) | 0 refills | Status: AC

## 2023-12-12 MED ORDER — LANCET DEVICE MISC: 1.0000 | Freq: Three times a day (TID) | 0 refills | Status: AC

## 2023-12-12 MED ORDER — INSULIN ASPART 100 UNIT/ML IJ SOLN: 0.0000 [IU] | Freq: Three times a day (TID) | INTRAMUSCULAR | 11 refills | Status: AC

## 2023-12-12 MED ORDER — BLOOD GLUCOSE MONITORING SUPPL DEVI: 1.0000 | Freq: Three times a day (TID) | 0 refills | Status: AC

## 2023-12-12 MED ORDER — INSULIN GLARGINE 100 UNIT/ML SOLOSTAR PEN: 14.0000 [IU] | PEN_INJECTOR | Freq: Every day | SUBCUTANEOUS | 2 refills | Status: AC

## 2023-12-12 NOTE — Plan of Care (Signed)

## 2023-12-12 NOTE — Progress Notes (Signed)
 Pt discharged home in care of husband. Pt alert and pleasantly confused. Foley catheter removed and bladder scan result 0. Pt ambulates with front wheel walker with assistance while at hospital.Both IVs removed. Catheter and tip intact. Dry gauze and tape applied to cover.Discharge paper work explained and given to husband. Rx sent to pharmacy of choice for pick up. Pt taken to lobby by staff in wheelchair.

## 2023-12-12 NOTE — Plan of Care (Signed)
  Problem: Clinical Measurements: Goal: Ability to maintain clinical measurements within normal limits will improve Outcome: Progressing   Problem: Nutrition: Goal: Adequate nutrition will be maintained Outcome: Progressing   

## 2023-12-12 NOTE — Discharge Summary (Signed)
 [facility] Discharge Summary   Patient: Alejandra Lowe FMW:[id number]  PCP: Lenon Layman ORN, MD  Date of admission: 12/09/2023   Date of discharge:  12/12/2023     Discharge Diagnoses:  Principal Problem:   DKA (diabetic ketoacidosis) (HCC) Active Problems:   HTN (hypertension)   Dementia without behavioral disturbance (HCC)   Admitted From: Home Disposition:  Home   Recommendations for Outpatient Follow-up:  F/u with PCP in 1 wk, Monitor CBG and use [place] insuline for now, f/u with Endocrine to learn how to use insuline pump  Continue DM diet  Follow up LABS/TEST: Repeat LFTs in 1 -2 weeks   Follow-up Information     Lenon Layman ORN, MD Follow up in 1 week(s).   Specialty: Internal Medicine Why: repeat LFTs Contact information: [address] [facility] [place] [address] [phone]                Diet recommendation: Cardiac and Carb modified diet  Activity: The patient is advised to gradually reintroduce usual activities, as tolerated  Discharge Condition: stable  Code Status: DNR-Limited  History of present illness: As per the H and P dictated on admission. Hospital Course:  Alejandra Lowe is a pleasant 78 y.o. female with medical history significant for Type 1 Diabetes, HLD, HTN, dementia who presented to ED for feeling sick, nauseated, vomiting with high blood sugars.  Patient has insulin  pump which has not been attached, it was unclear whether she was using insulin  pump at home or not.  It appears that patient has dementia and she does not remember when I asked her questions.  Patient's husband was at bedside and when I asked he said he has not helped her with the insulin  she does herself.  He said that the blood sugar indicated high and high and she was confused and she was weak and she vomited once and he called EMS.  Patient denies any abdominal pain, fever, chest pain or shortness of breath.   ED Course: Upon arrival  to the ED, patient is found to be hyperglycemic blood sugar around 500 by EMS.  Blood sugars were 1045, potassium 6.7, sodium 122, creatinine 2.84 BUN 61 white count 12.7 beta hydroxybutyrate more than 8.  Patient was started on IV fluid and insulin  drip.  Hospitalist service was consulted for evaluation for admission for DKA.     Assessment and Plan:  # Acute DKA (Diabetic ketoacidosis): Resolved  - It appears that her insulin  pump was not working. - Patient appears to be demented and husband does not have a clue whether she was taking insulin  or not. - It appears that there was some discrepancy between using insulin  or forgetting to use insulin . S/p insulin  IV infusion and bolus IVF given. DKA resolved, anion gap closed. Transition to subcu insulin  Started Lantus  14 units, NovoLog  3 units 3 times daily and NovoLog  sliding scale. Continue medium carb modified diet S/p IV fluid for hydration. 8/31 patient was discharged on Lantus  14 units subcu daily, NovoLog  3 units 3 times daily with meals and sliding scale.  Advised to monitor CBG and follow-up with PCP/endocrinologist  # Elevated LFTs, mildly unknown cause could be due to hyperglycemia Repeat LFTs in 1 to 2 weeks.  Follow with PCP.   # Hypophosphatemia, Phos repleted.  Resolved   # Hyperkalemia due to DKA: Resolved  - There is no tented T waves in EKG  # Pseudohyponatremia due to hyperglycemia. Na 141, Resolved  #  HTN: Resume losartan  home dose.  Patient was advised to monitor BP at home and follow with PCP. # Hyperlipidemia: Continue statin # Dementia: resumed donepezil. # AKI: Due to DKA likely prerenal, Resolved s/p IVF. sCr 0.60 # Anemia of chronic disease, iron profile, folate and B12 WNL. Stable # Hypocalcemia due to hyperlipidemia, vitamin D  within normal range    There is no height or weight on file to calculate BMI.  Nutrition Interventions:  Patient was ambulatory without any assistance. On the day of the discharge the  patient's vitals were stable, and no other acute medical condition were reported by patient. the patient was felt safe to be discharge at Home.  Consultants: None Procedures: None  Discharge Exam: General: Appear in no distress, no Rash; Oral Mucosa Clear, moist. Cardiovascular: S1 and S2 Present, no Murmur, Respiratory: normal respiratory effort, Bilateral Air entry present and no Crackles, no wheezes Abdomen: Bowel Sound present, Soft and no tenderness, no hernia Extremities: no Pedal edema, no calf tenderness Neurology: alert and oriented to time, place, and person affect appropriate.  There were no vitals filed for this visit. Vitals:   12/12/23 0351 12/12/23 0751  BP: (!) 141/68 (!) 140/57  Pulse: 74 69  Resp: 18 17  Temp: 98.8 F (37.1 C) 98.3 F (36.8 C)  SpO2: 92% 94%    DISCHARGE MEDICATION: Allergies as of 12/12/2023   No Known Allergies      Medication List     PAUSE taking these medications    atorvastatin  10 MG tablet Wait to take this until your doctor or other care provider tells you to start again. Commonly known as: LIPITOR Take 10 mg by mouth daily.       STOP taking these medications    FreeStyle Libre 14 Day Reader [facility] 14 Day Sensor Misc   insulin  aspart 100 UNIT/ML injection Commonly known as: novoLOG  Replaced by: insulin  aspart 100 UNIT/ML FlexPen   nitrofurantoin  (macrocrystal-monohydrate) 100 MG capsule Commonly known as: MACROBID        TAKE these medications    acetaminophen  325 MG tablet Commonly known as: TYLENOL  Take 2 tablets (650 mg total) by mouth every 6 (six) hours as needed for mild pain (pain score 1-3).   Blood Glucose Monitoring Suppl Devi 1 each by Does not apply route 3 (three) times daily. May dispense any manufacturer covered by patient's insurance.   calcium  carbonate 1500 (600 Ca) MG Tabs tablet Commonly known as: OSCAL Take 600 mg of elemental calcium  by mouth daily with breakfast.    donepezil 5 MG tablet Commonly known as: ARICEPT Take 5 mg by mouth at bedtime.   Gvoke HypoPen 2-Pack 1 MG/0.2ML Soaj Generic drug: Glucagon Inject 1 mg into the skin once as needed (allergic reaction).   insulin  aspart 100 UNIT/ML FlexPen Commonly known as: NOVOLOG  Inject 3 Units into the skin 3 (three) times daily with meals. If eating and Blood Glucose (BG) 80 or higher inject 3 units for meal coverage and add correction dose per scale. If not eating, correction dose only. BG <150= 0 unit; BG 150-200= 1 unit; BG 201-250= 2 unit; BG 251-300= 3 unit; BG 301-350= 4 unit; BG 351-400= 5 unit; BG >400= 6 unit and Call Primary Care. Replaces: insulin  aspart 100 UNIT/ML injection   insulin  aspart 100 UNIT/ML injection Commonly known as: novoLOG  Inject 0-9 Units into the skin 3 (three) times daily with meals. CBG 121 - 150: 1 unit CBG 151 - 200: 2 units CBG  201 - 250: 3 units CBG 251 - 300: 5 units CBG 301 - 350: 7 units CBG 351 - 400: 9 units   insulin  aspart 100 UNIT/ML injection Commonly known as: novoLOG  Inject 3 Units into the skin 3 (three) times daily with meals.   insulin  glargine 100 UNIT/ML Solostar Pen Commonly known as: LANTUS  Inject 14 Units into the skin daily. May substitute as needed per insurance.   Lancet Device Misc 1 each by Does not apply route 3 (three) times daily. May dispense any manufacturer covered by patient's insurance.   Lancets Misc 1 each by Does not apply route 3 (three) times daily. Use as directed to check blood sugar. May dispense any manufacturer covered by patient's insurance and fits patient's device.   losartan  25 MG tablet Commonly known as: COZAAR  Take 1 tablet by mouth daily.   Pen Needles 31G X 5 MM Misc 1 each by Does not apply route 3 (three) times daily. May dispense any manufacturer covered by patient's insurance.   Precision QID Test test strip Generic drug: glucose blood 1 strip.  1 each (1 strip total) 3 (three) times daily Use  as instructed. What changed: Another medication with the same name was added. Make sure you understand how and when to take each.   BLOOD GLUCOSE TEST STRIPS Strp 1 each by Does not apply route 3 (three) times daily. Use as directed to check blood sugar. May dispense any manufacturer covered by patient's insurance and fits patient's device. What changed: You were already taking a medication with the same name, and this prescription was added. Make sure you understand how and when to take each.   triamcinolone cream 0.1 % Commonly known as: KENALOG Apply 1 Application topically 2 (two) times daily as needed (itching).   Vitamin D  50 MCG (2000 UT) Caps Take 2,000 Units by mouth daily.   vitamin E  180 MG (400 UNITS) capsule Take 400 Units by mouth daily.       No Known Allergies Discharge Instructions     Call MD for:   Complete by: As directed    Uncontrolled blood glucose   Call MD for:  difficulty breathing, headache or visual disturbances   Complete by: As directed    Call MD for:  extreme fatigue   Complete by: As directed    Call MD for:  persistant dizziness or light-headedness   Complete by: As directed    Call MD for:  persistant nausea and vomiting   Complete by: As directed    Call MD for:  severe uncontrolled pain   Complete by: As directed    Call MD for:  temperature >100.4   Complete by: As directed    Diet - low sodium heart healthy   Complete by: As directed    Diet Carb Modified   Complete by: As directed    Discharge instructions   Complete by: As directed    F/u with PCP in 1 wk, Repeat LFTs in 1 -2 weeks Monitor CBG and use [place] insuline for now, f/u with Endocrine to learn how to use insuline pump  Continue DM diet   Increase activity slowly   Complete by: As directed        The results of significant diagnostics from this hospitalization (including imaging, microbiology, ancillary and laboratory) are listed below for reference.    Significant  Diagnostic Studies: CUP PACEART INCLINIC DEVICE CHECK Result Date: 12/02/2023 Normal in-clinic _dual__ chamber pacemaker check. Presenting Rhythm: _AS-VS__ .  Routine testing of thresholds, sensing, and impedance demonstrate stable parameters and no programming changes needed at this time. No episodes. Estimated longevity __14.7 years__ . Pt enrolled in remote follow-up. CANDIE Needle, NP   Microbiology: Recent Results (from the past 240 hours)  MRSA Next Gen by PCR, Nasal     Status: None   Collection Time: 12/09/23  1:54 PM   Specimen: Nasal Mucosa; Nasal Swab  Result Value Ref Range Status   MRSA by PCR Next Gen NOT DETECTED NOT DETECTED Final    Comment: (NOTE) The GeneXpert MRSA Assay (FDA approved for NASAL specimens only), is one component of a comprehensive MRSA colonization surveillance program. It is not intended to diagnose MRSA infection nor to guide or monitor treatment for MRSA infections. Test performance is not FDA approved in patients less than 24 years old. Performed at [facility], [address] [place], [place] [address]      Labs: CBC: Recent Labs  Lab 12/09/23 0945 12/10/23 0411 12/11/23 0544 12/12/23 0651  WBC 12.7* 12.2* 9.6 5.8  HGB 11.0* 9.5* 10.4* 9.8*  HCT 36.3 27.9* 30.5* 28.3*  MCV 107.7* 94.3 95.0 94.0  PLT 267 196 199 150   Basic Metabolic Panel: Recent Labs  Lab 12/09/23 2141 12/10/23 0157 12/10/23 0411 12/10/23 1010 12/10/23 1345 12/11/23 0544 12/12/23 0651  NA 131*   < > 135 137 137 138 141  K 4.5   < > 3.9 4.5 4.3 3.8 3.4*  CL 101   < > 107 107 106 104 105  CO2 19*   < > 22 24 22  21* 25  GLUCOSE 528*   < > 196* 198* 215* 264* 136*  BUN 53*   < > 48* 42* 41* 28* 14  CREATININE 2.15*   < > 1.63* 1.29* 1.37* 1.06* 0.60  CALCIUM  8.6*   < > 8.6* 8.6* 8.8* 8.7* 8.2*  MG 1.7  --   --  2.2  --  2.0 1.7  PHOS 2.6  --   --  2.4*  --  1.8* 3.5   < > = values in this interval not displayed.   Liver Function Tests: Recent Labs   Lab 12/09/23 0945 12/10/23 0411 12/11/23 0544 12/12/23 0651  AST 55* 79* 144* 133*  ALT 35 50* 95* 109*  ALKPHOS 100 48 60 70  BILITOT 1.4* 0.6 0.8 1.1  PROT 6.7 5.2* 5.8* 5.2*  ALBUMIN 3.9 3.1* 3.5 2.8*   No results for input(s): LIPASE, AMYLASE in the last 168 hours. No results for input(s): AMMONIA in the last 168 hours. Cardiac Enzymes: No results for input(s): CKTOTAL, CKMB, CKMBINDEX, TROPONINI in the last 168 hours. BNP (last 3 results) Recent Labs    08/26/23 1554  BNP 710.2*   CBG: Recent Labs  Lab 12/11/23 0758 12/11/23 1142 12/11/23 1655 12/11/23 2112 12/12/23 0755  GLUCAP 264* 170* 212* 127* 144*    Time spent: 35 minutes  Signed:  Elvan Sor  [facility] 12/12/2023 9:48 AM

## 2023-12-14 DIAGNOSIS — E10649 Type 1 diabetes mellitus with hypoglycemia without coma: Secondary | ICD-10-CM | POA: Diagnosis not present

## 2023-12-14 DIAGNOSIS — E1069 Type 1 diabetes mellitus with other specified complication: Secondary | ICD-10-CM | POA: Diagnosis not present

## 2023-12-14 DIAGNOSIS — E785 Hyperlipidemia, unspecified: Secondary | ICD-10-CM | POA: Diagnosis not present

## 2023-12-17 ENCOUNTER — Other Ambulatory Visit: Payer: Self-pay

## 2023-12-17 ENCOUNTER — Telehealth: Payer: Self-pay | Admitting: Physician Assistant

## 2023-12-17 DIAGNOSIS — N390 Urinary tract infection, site not specified: Secondary | ICD-10-CM

## 2023-12-17 MED ORDER — NITROFURANTOIN MONOHYD MACRO 100 MG PO CAPS: 100.0000 mg | ORAL_CAPSULE | Freq: Every day | ORAL | 6 refills | Status: AC

## 2023-12-17 NOTE — Telephone Encounter (Signed)
 I am not sure what this means.  If she having UTI symptoms right now?  I recently resumed her daily suppressive Macrobid , but it looks like it was stopped when she was recently in the hospital.  If she needs to be treated for UTI, she needs to go to urgent care.  If she just needs to resume daily suppressive antibiotics, okay to send that in.

## 2023-12-17 NOTE — Telephone Encounter (Signed)
 Spoke with patient she did state she was having UTI symptoms I did advise her she would need to go to the nearest urgent care or emergency room. I offered to send in her suppressive antibiotic for recurrent UTI's and she agreed that would be better. Medication sent to [facility] in [place]. Patient voiced understanding.

## 2023-12-17 NOTE — Telephone Encounter (Signed)
 The patient is calling to request a stronger prescription to replace Keflex  and would like a call back with clarification.

## 2023-12-29 DIAGNOSIS — R569 Unspecified convulsions: Secondary | ICD-10-CM | POA: Diagnosis not present

## 2023-12-29 DIAGNOSIS — R42 Dizziness and giddiness: Secondary | ICD-10-CM | POA: Diagnosis not present

## 2023-12-29 DIAGNOSIS — Z711 Person with feared health complaint in whom no diagnosis is made: Secondary | ICD-10-CM | POA: Diagnosis not present

## 2023-12-29 DIAGNOSIS — R2689 Other abnormalities of gait and mobility: Secondary | ICD-10-CM | POA: Diagnosis not present

## 2023-12-29 DIAGNOSIS — R296 Repeated falls: Secondary | ICD-10-CM | POA: Diagnosis not present

## 2023-12-29 DIAGNOSIS — R519 Headache, unspecified: Secondary | ICD-10-CM | POA: Diagnosis not present

## 2023-12-29 DIAGNOSIS — R55 Syncope and collapse: Secondary | ICD-10-CM | POA: Diagnosis not present

## 2024-01-10 ENCOUNTER — Ambulatory Visit (INDEPENDENT_AMBULATORY_CARE_PROVIDER_SITE_OTHER)

## 2024-01-10 DIAGNOSIS — I442 Atrioventricular block, complete: Secondary | ICD-10-CM | POA: Diagnosis not present

## 2024-01-10 LAB — CUP PACEART REMOTE DEVICE CHECK
Battery Remaining Longevity: 176 mo
Battery Voltage: 3.21 V
Brady Statistic AP VP Percent: 0.82 %
Brady Statistic AP VS Percent: 9.02 %
Brady Statistic AS VP Percent: 3.39 %
Brady Statistic AS VS Percent: 86.77 %
Brady Statistic RA Percent Paced: 10.07 %
Brady Statistic RV Percent Paced: 4.21 %
Date Time Interrogation Session: 20250928231004
Implantable Lead Connection Status: 753985
Implantable Lead Connection Status: 753985
Implantable Lead Implant Date: 20250518
Implantable Lead Implant Date: 20250518
Implantable Lead Location: 753859
Implantable Lead Location: 753860
Implantable Lead Model: 3830
Implantable Lead Model: 5076
Implantable Pulse Generator Implant Date: 20250518
Lead Channel Impedance Value: 399 Ohm
Lead Channel Impedance Value: 399 Ohm
Lead Channel Impedance Value: 475 Ohm
Lead Channel Impedance Value: 532 Ohm
Lead Channel Pacing Threshold Amplitude: 0.5 V
Lead Channel Pacing Threshold Amplitude: 0.5 V
Lead Channel Pacing Threshold Pulse Width: 0.4 ms
Lead Channel Pacing Threshold Pulse Width: 0.4 ms
Lead Channel Sensing Intrinsic Amplitude: 13.25 mV
Lead Channel Sensing Intrinsic Amplitude: 13.25 mV
Lead Channel Sensing Intrinsic Amplitude: 3.375 mV
Lead Channel Sensing Intrinsic Amplitude: 3.375 mV
Lead Channel Setting Pacing Amplitude: 1.5 V
Lead Channel Setting Pacing Amplitude: 2 V
Lead Channel Setting Pacing Pulse Width: 0.4 ms
Lead Channel Setting Sensing Sensitivity: 1.2 mV
Zone Setting Status: 755011

## 2024-01-12 NOTE — Progress Notes (Signed)
 Remote PPM Transmission

## 2024-01-18 ENCOUNTER — Ambulatory Visit: Payer: Self-pay | Admitting: Cardiovascular Disease

## 2024-01-27 DIAGNOSIS — E785 Hyperlipidemia, unspecified: Secondary | ICD-10-CM | POA: Diagnosis not present

## 2024-01-27 DIAGNOSIS — E1069 Type 1 diabetes mellitus with other specified complication: Secondary | ICD-10-CM | POA: Diagnosis not present

## 2024-01-27 DIAGNOSIS — E10649 Type 1 diabetes mellitus with hypoglycemia without coma: Secondary | ICD-10-CM | POA: Diagnosis not present

## 2024-02-21 DIAGNOSIS — Z23 Encounter for immunization: Secondary | ICD-10-CM | POA: Diagnosis not present

## 2024-02-21 DIAGNOSIS — E1069 Type 1 diabetes mellitus with other specified complication: Secondary | ICD-10-CM | POA: Diagnosis not present

## 2024-02-21 DIAGNOSIS — N182 Chronic kidney disease, stage 2 (mild): Secondary | ICD-10-CM | POA: Diagnosis not present

## 2024-02-21 DIAGNOSIS — F0390 Unspecified dementia without behavioral disturbance: Secondary | ICD-10-CM | POA: Diagnosis not present

## 2024-02-21 DIAGNOSIS — E785 Hyperlipidemia, unspecified: Secondary | ICD-10-CM | POA: Diagnosis not present

## 2024-02-21 DIAGNOSIS — E1022 Type 1 diabetes mellitus with diabetic chronic kidney disease: Secondary | ICD-10-CM | POA: Diagnosis not present

## 2024-02-21 DIAGNOSIS — I10 Essential (primary) hypertension: Secondary | ICD-10-CM | POA: Diagnosis not present

## 2024-02-21 NOTE — Progress Notes (Signed)
 Alejandra Lowe is a 78 y.o. female here for follow up of their medical problems  CHIEF COMPLAINT:  Follow up medical problems in the problem list and as discussed in the history and assessment areas as well as new complaints as listed.   Patient Active Problem List  Diagnosis  . Lumbar radiculitis  . DDD (degenerative disc disease), lumbar  . Lumbar stenosis with neurogenic claudication  . Type 1 diabetes mellitus with chronic kidney disease (CMS/HHS-HCC)  . Hyperlipidemia associated with type 1 diabetes mellitus (CMS-HCC)  . Health care maintenance  . Type 1 diabetes mellitus with hypoglycemia (CMS-HCC)  . Diabetic macular edema of right eye with mild nonproliferative retinopathy associated with type 1 diabetes mellitus (CMS/HHS-HCC)  . Background diabetic retinopathy of left eye without macular edema determined by examination associated with type 1 diabetes mellitus (CMS/HHS-HCC)  . Hypertension, essential  . Dementia arising in the senium and presenium (CMS-HCC)     HISTORY OF PRESENT ILLNESS:  Type 1 diabetes mellitus with chronic kidney disease (CMS/HHS-HCC) Bp great with propraolol and no albuminuria now, on low dose losartan  also   Hypertension, essential Taking medications without noted side effects or dizziness.    Hyperlipidemia associated with type 1 diabetes mellitus (CMS-HCC) Three times a week atorvastatin  noted   Dementia arising in the senium and presenium (CMS/HHS-HCC) Aricept is ongoing via neuro   Past Medical History:  Diagnosis Date  . Arthritis    Right hip  . Breast cancer (CMS/HHS-HCC)    left  . Diabetes mellitus type I (CMS/HHS-HCC)   . Family history of breast cancer   . Glaucoma (increased eye pressure) 2019   Not bad enough for eye drops yet  . History of cataract    Removed on 04/17/14 & 04/24/14  . Hyperlipidemia   . Hypertension   . Type 1 diabetes mellitus with chronic kidney disease (CMS/HHS-HCC)     Past Surgical History:   Procedure Laterality Date  . COLONOSCOPY  03/20/2004  . AUTOGRAFT MORSELIZED OBTAINED SEPARATE INCISION FOR SPINE SURGERY  11/2016  . abdominalplasty    . CATARACT EXTRACTION     Removed on 04/17/14 & 04/24/14  . COLONOSCOPY  11/10/2013 [facility]   Normal/Repeat 7yrs/[place]  . FRACTURE SURGERY  ?   Broken Wrist quite a few years ago  . fx wrist    . MASTECTOMY PARTIAL / LUMPECTOMY    . shoulder surgery       No fever chills or sweats   No nausea, vomiting or diarrhea  No chest pain, shortness of breath   Social History   Socioeconomic History  . Marital status: Married    Spouse name: Ray  . Number of children: 1  . Years of education: 37  Occupational History  . Occupation: retired  Tobacco Use  . Smoking status: Never  . Smokeless tobacco: Never  Vaping Use  . Vaping status: Never Used  Substance and Sexual Activity  . Alcohol use: Yes    Alcohol/week: 2.0 - 3.0 standard drinks of alcohol    Types: 2 - 3 Glasses of wine per week    Comment: Mostly on the weekend. One Bloody Mary  . Drug use: No  . Sexual activity: Not Currently   Social Drivers of Health   Financial Resource Strain: Low Risk  (01/27/2024)   Overall Financial Resource Strain (CARDIA)   . Difficulty of Paying Living Expenses: Not hard at all  Food Insecurity: No Food Insecurity (01/27/2024)   Hunger Vital Sign   .  Worried About Programme Researcher, Broadcasting/film/video in the Last Year: Never true   . Ran Out of Food in the Last Year: Never true  Transportation Needs: No Transportation Needs (01/27/2024)   PRAPARE - Transportation   . Lack of Transportation (Medical): No   . Lack of Transportation (Non-Medical): No  Social Connections: Unknown (12/12/2023)   Received [facility]   Social Connection and Isolation Panel   . In a typical week, how many times do you talk on the phone with family, friends, or neighbors?: Patient unable to answer   . How often do you get together with friends or relatives?: Patient unable  to answer   . How often do you attend church or religious services?: Patient unable to answer   . Do you belong to any clubs or organizations such as church groups, unions, fraternal or athletic groups, or school groups?: Patient unable to answer   . Are you married, widowed, divorced, separated, never married, or living with a partner?: Patient unable to answer  Housing Stability: Low Risk  (01/27/2024)   Housing Stability Vital Sign   . Unable to Pay for Housing in the Last Year: No   . Number of Times Moved in the Last Year: 0   . Homeless in the Last Year: No      Current Outpatient Medications:  .  acetaminophen  (TYLENOL ) 325 MG tablet, Take 650 mg by mouth every 6 (six) hours, Disp: , Rfl:  .  atorvastatin  (LIPITOR) 10 MG tablet, TAKE 1/2 TABLET BY MOUTH THREE TIMES A WEEK, Disp: 18 tablet, Rfl: 3 .  blood glucose diagnostic test strip, 1 each (1 strip total) 3 (three) times daily Use as instructed., Disp: 100 each, Rfl: 12 .  blood glucose meter kit, as directed, Disp: 1 each, Rfl: 0 .  blood-glucose meter Misc, 1 each, Disp: , Rfl:  .  blood-glucose sensor (FREESTYLE LIBRE 3 PLUS SENSOR) Devi, Use 1 each every 15 (fifteen) days, Disp: 6 each, Rfl: 4 .  blood-glucose,receiver,cont (FREESTYLE LIBRE 3 READER) Misc, Use 1 each as needed (to monitor blood glucose), Disp: 1 each, Rfl: 0 .  CALCIUM  CARBONATE (CALCIUM  500 ORAL), Take 500 mg by mouth once daily. , Disp: , Rfl:  .  cholecalciferol  (VITAMIN D3) 2,000 unit tablet, Take 2,000 Units by mouth once daily, Disp: , Rfl:  .  donepeziL (ARICEPT) 10 MG tablet, Take 1 tablet (10 mg total) by mouth at bedtime, Disp: 30 tablet, Rfl: 6 .  flash glucose sensor (FREESTYLE LIBRE 2 SENSOR) Kit, Use 1 kit every 14 (fourteen) days for glucose monitoring, Disp: 6 kit, Rfl: 3 .  GAS RELIEF EXTRA STRENGTH 125 mg chewable tablet, Take 125 mg by mouth as needed, Disp: , Rfl:  .  glucagon (GVOKE HYPOPEN 1-PACK) 1 mg/0.2 mL auto-injector, Inject 0.2 mLs  (1 mg total) subcutaneously as directed for Low blood sugar (in case of severe hypoglycemia), Disp: 0.2 mL, Rfl: 1 .  insulin  ASPART (NOVOLOG  U-100 INSULIN  ASPART) injection (concentration 100 units/mL), USE UP TO 40 UNITS IN PUMP AS DIRECTED. E10.649--REPLACE OPEN VIAL EVERY 28 DAYS, Disp: 30 mL, Rfl: 3 .  insulin  GLARGINE (LANTUS  SOLOSTAR) pen injector (concentration 100 units/mL), Inject 14 Units subcutaneously, Disp: , Rfl:  .  losartan  (COZAAR ) 25 MG tablet, Take 1 tablet (25 mg total) by mouth once daily, Disp: 30 tablet, Rfl: 11 .  NOVOLOG  FLEXPEN U-100 INSULIN  pen injector (concentration 100 units/mL), Inject 3 Units subcutaneously Sliding scale, Disp: , Rfl:  .  ONETOUCH DELICA PLUS LANCET, , Disp: , Rfl:  .  triamcinolone 0.1 % cream, APPLY TO AFFECTED AREA TWICE A DAY, Disp: 30 g, Rfl: 1 .  ULTRA-FINE PEN NEEDLE 31 gauge x 3/16 needle, One shot 4 times per day, Disp: 100 each, Rfl: 12 .  vitamin E  400 UNIT capsule, Take 400 Units by mouth once daily, Disp: , Rfl:  .  donepeziL (ARICEPT) 5 MG tablet, Take 1 tablet (5 mg total) by mouth at bedtime (Patient not taking: Reported on 02/21/2024), Disp: 30 tablet, Rfl: 5 .  meclizine (ANTIVERT) 12.5 mg tablet, Take 12.5 mg by mouth once daily as needed for Dizziness (Patient not taking: Reported on 02/21/2024), Disp: , Rfl:  .  nitrofurantoin , macrocrystal-monohydrate, (MACROBID ) 100 MG capsule, Take 100 mg by mouth 2 (two) times daily (Patient not taking: Reported on 02/21/2024), Disp: , Rfl:   Vitals:   02/21/24 1423  BP: 122/68  Pulse: 66   Body mass index is 21.46 kg/m. No acute distress Lungs; clear to ascultation Heart; Regular rate and rhythm  Abdomen; Soft and flat, normal bowel sounds Extremities; No clubbing, cyanosis or edema  Office Visit on 12/14/2023  Component Date Value Ref Range Status  . Hemoglobin A1C 12/14/2023 7.0 (H)  4.2 - 5.6 % Final  . Average Blood Glucose (Calc) 12/14/2023 154  mg/dL Final    ASSESSMENT   AND PLAN:  Diagnoses and all orders for this visit:  Type 1 diabetes mellitus with stage 2 chronic kidney disease (CMS/HHS-HCC) Assessment & Plan: Bp great with propraolol and no albuminuria now, on low dose losartan  also   Orders: -     Comprehensive Metabolic Panel (CMP); Future -     Lipid Panel w/calc LDL; Future -     Microalbumin/Creatinine Ratio, Random Urine; Future -     Hemoglobin A1C; Future -     FLU VACCINE MDCK IIV3 (EGG FREE), IM PF, (53MO+)(FLUCELVAX)  Hypertension, essential Assessment & Plan: Taking medications without noted side effects or dizziness.    Orders: -     Comprehensive Metabolic Panel (CMP); Future -     Lipid Panel w/calc LDL; Future -     Microalbumin/Creatinine Ratio, Random Urine; Future -     Hemoglobin A1C; Future -     FLU VACCINE MDCK IIV3 (EGG FREE), IM PF, (53MO+)(FLUCELVAX)  Hyperlipidemia associated with type 1 diabetes mellitus (CMS-HCC) Assessment & Plan: Three times a week atorvastatin  noted   Orders: -     Comprehensive Metabolic Panel (CMP); Future -     Lipid Panel w/calc LDL; Future -     Microalbumin/Creatinine Ratio, Random Urine; Future -     Hemoglobin A1C; Future -     FLU VACCINE MDCK IIV3 (EGG FREE), IM PF, (53MO+)(FLUCELVAX)  Dementia arising in the senium and presenium (CMS-HCC) Assessment & Plan: Aricept is ongoing via neuro    Encounter for immunization -     FLU VACCINE MDCK IIV3 (EGG FREE), IM PF, (53MO+)(FLUCELVAX)

## 2024-03-14 DIAGNOSIS — E1069 Type 1 diabetes mellitus with other specified complication: Secondary | ICD-10-CM | POA: Diagnosis not present

## 2024-03-14 DIAGNOSIS — E10649 Type 1 diabetes mellitus with hypoglycemia without coma: Secondary | ICD-10-CM | POA: Diagnosis not present

## 2024-03-14 DIAGNOSIS — E785 Hyperlipidemia, unspecified: Secondary | ICD-10-CM | POA: Diagnosis not present

## 2024-03-14 DIAGNOSIS — N182 Chronic kidney disease, stage 2 (mild): Secondary | ICD-10-CM | POA: Diagnosis not present

## 2024-03-14 DIAGNOSIS — E1022 Type 1 diabetes mellitus with diabetic chronic kidney disease: Secondary | ICD-10-CM | POA: Diagnosis not present

## 2024-03-14 DIAGNOSIS — I10 Essential (primary) hypertension: Secondary | ICD-10-CM | POA: Diagnosis not present

## 2024-04-10 ENCOUNTER — Ambulatory Visit

## 2024-04-10 DIAGNOSIS — I442 Atrioventricular block, complete: Secondary | ICD-10-CM

## 2024-04-11 LAB — CUP PACEART REMOTE DEVICE CHECK
Battery Remaining Longevity: 166 mo
Battery Voltage: 3.18 V
Brady Statistic AP VP Percent: 9.06 %
Brady Statistic AP VS Percent: 5.78 %
Brady Statistic AS VP Percent: 53.02 %
Brady Statistic AS VS Percent: 32.15 %
Brady Statistic RA Percent Paced: 14.83 %
Brady Statistic RV Percent Paced: 62.08 %
Date Time Interrogation Session: 20251228230735
Implantable Lead Connection Status: 753985
Implantable Lead Connection Status: 753985
Implantable Lead Implant Date: 20250518
Implantable Lead Implant Date: 20250518
Implantable Lead Location: 753859
Implantable Lead Location: 753860
Implantable Lead Model: 3830
Implantable Lead Model: 5076
Implantable Pulse Generator Implant Date: 20250518
Lead Channel Impedance Value: 399 Ohm
Lead Channel Impedance Value: 399 Ohm
Lead Channel Impedance Value: 475 Ohm
Lead Channel Impedance Value: 513 Ohm
Lead Channel Pacing Threshold Amplitude: 0.5 V
Lead Channel Pacing Threshold Amplitude: 0.625 V
Lead Channel Pacing Threshold Pulse Width: 0.4 ms
Lead Channel Pacing Threshold Pulse Width: 0.4 ms
Lead Channel Sensing Intrinsic Amplitude: 11.75 mV
Lead Channel Sensing Intrinsic Amplitude: 11.75 mV
Lead Channel Sensing Intrinsic Amplitude: 3 mV
Lead Channel Sensing Intrinsic Amplitude: 3 mV
Lead Channel Setting Pacing Amplitude: 1.5 V
Lead Channel Setting Pacing Amplitude: 2 V
Lead Channel Setting Pacing Pulse Width: 0.4 ms
Lead Channel Setting Sensing Sensitivity: 1.2 mV
Zone Setting Status: 755011

## 2024-04-12 ENCOUNTER — Ambulatory Visit: Payer: Self-pay | Admitting: Cardiovascular Disease

## 2024-04-18 NOTE — Progress Notes (Signed)
 Remote PPM Transmission

## 2024-06-01 ENCOUNTER — Ambulatory Visit: Admitting: Physician Assistant

## 2024-07-10 ENCOUNTER — Encounter

## 2024-10-09 ENCOUNTER — Encounter

## 2025-01-08 ENCOUNTER — Encounter

## 2025-04-09 ENCOUNTER — Encounter

## 2025-07-09 ENCOUNTER — Encounter
